# Patient Record
Sex: Female | Born: 1950 | Race: White | Hispanic: No | Marital: Married | Smoking: Never smoker
Health system: Southern US, Community
[De-identification: ages and names within clinical notes are randomized; demographics above are authoritative.]

## PROBLEM LIST (undated history)

## (undated) DIAGNOSIS — E7800 Pure hypercholesterolemia, unspecified: Secondary | ICD-10-CM

## (undated) DIAGNOSIS — Z9889 Other specified postprocedural states: Secondary | ICD-10-CM

## (undated) DIAGNOSIS — F418 Other specified anxiety disorders: Secondary | ICD-10-CM

## (undated) DIAGNOSIS — Z79890 Hormone replacement therapy (postmenopausal): Secondary | ICD-10-CM

## (undated) DIAGNOSIS — M8580 Other specified disorders of bone density and structure, unspecified site: Secondary | ICD-10-CM

## (undated) DIAGNOSIS — J189 Pneumonia, unspecified organism: Secondary | ICD-10-CM

## (undated) DIAGNOSIS — K219 Gastro-esophageal reflux disease without esophagitis: Secondary | ICD-10-CM

## (undated) DIAGNOSIS — E669 Obesity, unspecified: Secondary | ICD-10-CM

## (undated) DIAGNOSIS — R591 Generalized enlarged lymph nodes: Secondary | ICD-10-CM

## (undated) DIAGNOSIS — D869 Sarcoidosis, unspecified: Secondary | ICD-10-CM

## (undated) DIAGNOSIS — R112 Nausea with vomiting, unspecified: Secondary | ICD-10-CM

## (undated) DIAGNOSIS — M1990 Unspecified osteoarthritis, unspecified site: Secondary | ICD-10-CM

## (undated) DIAGNOSIS — F329 Major depressive disorder, single episode, unspecified: Secondary | ICD-10-CM

## (undated) DIAGNOSIS — Z87442 Personal history of urinary calculi: Secondary | ICD-10-CM

## (undated) HISTORY — DX: Unspecified osteoarthritis, unspecified site: M19.90

## (undated) HISTORY — DX: Obesity, unspecified: E66.9

## (undated) HISTORY — DX: Pure hypercholesterolemia, unspecified: E78.00

## (undated) HISTORY — PX: EYE SURGERY: SHX253

## (undated) HISTORY — DX: Major depressive disorder, single episode, unspecified: F32.9

## (undated) HISTORY — PX: BREAST CYST ASPIRATION: SHX578

## (undated) HISTORY — DX: Generalized enlarged lymph nodes: R59.1

## (undated) HISTORY — DX: Hormone replacement therapy: Z79.890

## (undated) HISTORY — PX: CHOLECYSTECTOMY: SHX55

## (undated) HISTORY — DX: Sarcoidosis, unspecified: D86.9

## (undated) HISTORY — PX: GALLBLADDER SURGERY: SHX652

## (undated) HISTORY — PX: LITHOTRIPSY: SUR834

## (undated) HISTORY — DX: Gastro-esophageal reflux disease without esophagitis: K21.9

## (undated) HISTORY — PX: CORRECTION HAMMER TOE: SUR317

## (undated) HISTORY — PX: OTHER SURGICAL HISTORY: SHX169

## (undated) HISTORY — DX: Other specified anxiety disorders: F41.8

## (undated) HISTORY — DX: Other specified disorders of bone density and structure, unspecified site: M85.80

---

## 1999-05-19 ENCOUNTER — Other Ambulatory Visit: Admission: RE | Admit: 1999-05-19 | Discharge: 1999-05-19 | Payer: Self-pay | Admitting: Obstetrics and Gynecology

## 2000-12-06 ENCOUNTER — Other Ambulatory Visit: Admission: RE | Admit: 2000-12-06 | Discharge: 2000-12-06 | Payer: Self-pay | Admitting: Obstetrics and Gynecology

## 2000-12-14 ENCOUNTER — Encounter (HOSPITAL_COMMUNITY): Admission: RE | Admit: 2000-12-14 | Discharge: 2001-01-13 | Payer: Self-pay | Admitting: Rheumatology

## 2001-09-25 ENCOUNTER — Ambulatory Visit (HOSPITAL_COMMUNITY): Admission: RE | Admit: 2001-09-25 | Discharge: 2001-09-25 | Payer: Self-pay | Admitting: Obstetrics and Gynecology

## 2001-09-25 ENCOUNTER — Encounter: Payer: Self-pay | Admitting: Obstetrics and Gynecology

## 2002-10-02 ENCOUNTER — Encounter: Payer: Self-pay | Admitting: Obstetrics & Gynecology

## 2002-10-02 ENCOUNTER — Ambulatory Visit (HOSPITAL_COMMUNITY): Admission: RE | Admit: 2002-10-02 | Discharge: 2002-10-02 | Payer: Self-pay | Admitting: Obstetrics & Gynecology

## 2003-10-07 ENCOUNTER — Ambulatory Visit (HOSPITAL_COMMUNITY): Admission: RE | Admit: 2003-10-07 | Discharge: 2003-10-07 | Payer: Self-pay | Admitting: Obstetrics & Gynecology

## 2004-10-07 ENCOUNTER — Ambulatory Visit (HOSPITAL_COMMUNITY): Admission: RE | Admit: 2004-10-07 | Discharge: 2004-10-07 | Payer: Self-pay | Admitting: Obstetrics & Gynecology

## 2005-09-01 ENCOUNTER — Ambulatory Visit (HOSPITAL_COMMUNITY): Admission: RE | Admit: 2005-09-01 | Discharge: 2005-09-01 | Payer: Self-pay | Admitting: Internal Medicine

## 2005-09-01 ENCOUNTER — Ambulatory Visit: Payer: Self-pay | Admitting: Internal Medicine

## 2005-10-04 ENCOUNTER — Ambulatory Visit (HOSPITAL_COMMUNITY): Admission: RE | Admit: 2005-10-04 | Discharge: 2005-10-04 | Payer: Self-pay | Admitting: Obstetrics & Gynecology

## 2006-09-25 ENCOUNTER — Ambulatory Visit (HOSPITAL_COMMUNITY): Admission: RE | Admit: 2006-09-25 | Discharge: 2006-09-25 | Payer: Self-pay | Admitting: Obstetrics & Gynecology

## 2007-06-18 ENCOUNTER — Ambulatory Visit: Payer: Self-pay | Admitting: Orthopedic Surgery

## 2007-06-18 ENCOUNTER — Ambulatory Visit (HOSPITAL_COMMUNITY): Admission: RE | Admit: 2007-06-18 | Discharge: 2007-06-18 | Payer: Self-pay | Admitting: Pulmonary Disease

## 2007-06-18 DIAGNOSIS — S322XXA Fracture of coccyx, initial encounter for closed fracture: Secondary | ICD-10-CM

## 2007-06-18 DIAGNOSIS — S3210XA Unspecified fracture of sacrum, initial encounter for closed fracture: Secondary | ICD-10-CM | POA: Insufficient documentation

## 2007-06-20 ENCOUNTER — Encounter: Payer: Self-pay | Admitting: Orthopedic Surgery

## 2007-07-05 ENCOUNTER — Encounter (INDEPENDENT_AMBULATORY_CARE_PROVIDER_SITE_OTHER): Payer: Self-pay | Admitting: *Deleted

## 2007-07-18 ENCOUNTER — Emergency Department (HOSPITAL_COMMUNITY): Admission: EM | Admit: 2007-07-18 | Discharge: 2007-07-18 | Payer: Self-pay | Admitting: Emergency Medicine

## 2007-09-07 ENCOUNTER — Other Ambulatory Visit: Admission: RE | Admit: 2007-09-07 | Discharge: 2007-09-07 | Payer: Self-pay | Admitting: Obstetrics and Gynecology

## 2007-09-25 ENCOUNTER — Ambulatory Visit (HOSPITAL_COMMUNITY): Admission: RE | Admit: 2007-09-25 | Discharge: 2007-09-25 | Payer: Self-pay | Admitting: Obstetrics & Gynecology

## 2007-09-26 ENCOUNTER — Ambulatory Visit (HOSPITAL_COMMUNITY): Admission: RE | Admit: 2007-09-26 | Discharge: 2007-09-26 | Payer: Self-pay | Admitting: Obstetrics & Gynecology

## 2008-06-12 ENCOUNTER — Ambulatory Visit: Payer: Self-pay | Admitting: Orthopedic Surgery

## 2008-06-12 DIAGNOSIS — L609 Nail disorder, unspecified: Secondary | ICD-10-CM | POA: Insufficient documentation

## 2008-06-13 ENCOUNTER — Encounter: Payer: Self-pay | Admitting: Orthopedic Surgery

## 2008-07-14 ENCOUNTER — Ambulatory Visit (HOSPITAL_COMMUNITY): Admission: RE | Admit: 2008-07-14 | Discharge: 2008-07-14 | Payer: Self-pay | Admitting: Pulmonary Disease

## 2008-10-10 ENCOUNTER — Other Ambulatory Visit: Admission: RE | Admit: 2008-10-10 | Discharge: 2008-10-10 | Payer: Self-pay | Admitting: Obstetrics and Gynecology

## 2008-10-10 ENCOUNTER — Ambulatory Visit (HOSPITAL_COMMUNITY): Admission: RE | Admit: 2008-10-10 | Discharge: 2008-10-10 | Payer: Self-pay | Admitting: Obstetrics & Gynecology

## 2009-09-29 ENCOUNTER — Ambulatory Visit (HOSPITAL_COMMUNITY): Admission: RE | Admit: 2009-09-29 | Discharge: 2009-09-29 | Payer: Self-pay | Admitting: Obstetrics & Gynecology

## 2009-10-29 ENCOUNTER — Other Ambulatory Visit: Admission: RE | Admit: 2009-10-29 | Discharge: 2009-10-29 | Payer: Self-pay | Admitting: Obstetrics and Gynecology

## 2010-06-12 ENCOUNTER — Emergency Department (HOSPITAL_COMMUNITY): Admission: EM | Admit: 2010-06-12 | Discharge: 2010-06-12 | Payer: Self-pay | Admitting: Emergency Medicine

## 2010-06-14 ENCOUNTER — Ambulatory Visit (HOSPITAL_COMMUNITY)
Admission: RE | Admit: 2010-06-14 | Discharge: 2010-06-14 | Payer: Self-pay | Source: Home / Self Care | Admitting: Pulmonary Disease

## 2010-09-10 ENCOUNTER — Other Ambulatory Visit: Payer: Self-pay | Admitting: Obstetrics & Gynecology

## 2010-09-10 DIAGNOSIS — Z1239 Encounter for other screening for malignant neoplasm of breast: Secondary | ICD-10-CM

## 2010-09-11 ENCOUNTER — Encounter: Payer: Self-pay | Admitting: Obstetrics & Gynecology

## 2010-09-12 ENCOUNTER — Encounter: Payer: Self-pay | Admitting: Obstetrics & Gynecology

## 2010-09-28 ENCOUNTER — Ambulatory Visit (HOSPITAL_COMMUNITY): Admission: RE | Admit: 2010-09-28 | Payer: Self-pay | Source: Home / Self Care | Admitting: Obstetrics & Gynecology

## 2010-09-28 ENCOUNTER — Ambulatory Visit (HOSPITAL_COMMUNITY)
Admission: RE | Admit: 2010-09-28 | Discharge: 2010-09-28 | Disposition: A | Discharge status: Home / Self Care | Payer: 59 | Source: Ambulatory Visit | Attending: Obstetrics & Gynecology | Admitting: Obstetrics & Gynecology

## 2010-09-28 DIAGNOSIS — Z1239 Encounter for other screening for malignant neoplasm of breast: Secondary | ICD-10-CM

## 2010-09-28 DIAGNOSIS — Z1231 Encounter for screening mammogram for malignant neoplasm of breast: Secondary | ICD-10-CM | POA: Insufficient documentation

## 2011-01-07 NOTE — Op Note (Signed)
NAME:  Courtney Bradley, Courtney Bradley            ACCOUNT NO.:  [id number]   MEDICAL RECORD NO.:  [id number]          PATIENT TYPE:  AMB   LOCATION:  DAY                           FACILITY:  [facility]   PHYSICIAN:  Lionel December, M.D.    DATE OF BIRTH:  Oct 02, 1950   DATE OF PROCEDURE:  09/01/2005  DATE OF DISCHARGE:                                 OPERATIVE REPORT   PROCEDURE:  Colonoscopy.   INDICATION:  Courtney Bradley is a 60 year old Caucasian female who is here for  screening colonoscopy. Procedure and risks were reviewed with the patient,  and informed consent was obtained. Family history is negative for colorectal  carcinoma.   FINDINGS:  Procedure performed in endoscopy suite. The patient's vital signs  and O2 saturation were monitored during the procedure and remained stable.  The patient was placed in left lateral position and rectal examination  performed. No abnormality noted on external or digital exam. Olympus  videoscope was placed in rectum and advanced under vision into sigmoid colon  and beyond. Preparation was satisfactory. She had some liquid stool which  was suctioned out. There were a few tiny diverticula at sigmoid colon. The  patient was turned on her right to pass the scope across the angle of  transverse colon. Further intubation to cecum was easy. Cecum was identified  by ileocecal valve and appendiceal orifice. Pictures taken for the record.  As the scope was withdrawn, colonic mucosa was carefully examined, and there  were no polyps and/or other mucosal abnormalities. Rectal mucosa similarly  was normal. Scope was retroflexed to examine anorectal junction, and small  hemorrhoids were noted below the dentate line. Endoscope was straightened  and withdrawn. The patient tolerated the procedure well.   FINAL DIAGNOSIS:  Few tiny diverticula at sigmoid colon and small external  hemorrhoids, otherwise normal colonoscopy.   RECOMMENDATIONS:  1.  High-fiber diet.  2.  Yearly  Hemoccults.  3.  She may consider next screening exam in 10 years from now.   INDICATION:  Courtney Bradley is a 60 year old Caucasian female who is undergoing  screening colonoscopy. Family history is negative for colorectal carcinoma.  Procedure and risks were reviewed with the patient, and informed consent was  obtained.   MEDICINE FOR CONSCIOUS SEDATION:  Demerol 50 mg IV, Versed 5 mg IV.   FINDINGS:  Procedure performed in endoscopy suite. The patient's vital signs  and O2 saturation were monitored during the procedure and remained stable.  The patient was placed in left lateral position. Rectal examination  performed. No abnormality noted on external or digital exam. Olympus  videoscope was placed in rectum and advanced under vision into sigmoid colon  and beyond. Preparation was satisfactory. Scope was advanced into cecum  which was identified by appendiceal orifice and ileocecal valve. Pictures  taken for the record. As the scope was withdrawn, colonic mucosa was  carefully examined. There no polyps and/or tumor masses. Few small  diverticula were noted sigmoid colon. Rectal mucosa was normal. Scope was  retroflexed to examine anorectal junction ,and small hemorrhoids were noted  below the dentate line. Endoscope was straightened and withdrawn. The  patient tolerated the procedure well.   FINAL DIAGNOSIS:  Few tiny diverticula at sigmoid colon, external  hemorrhoids, otherwise normal examination.   RECOMMENDATIONS:  1.  High-fiber diet.  2.  Yearly Hemoccults.  3.  She may consider next screening exam in 10 years from now.      Lionel December, M.D.  Electronically Signed     NR/MEDQ  D:  09/01/2005  T:  09/01/2005  Job:  [id number]   cc:   Lazaro Arms, M.D.  Fax: [phone]   Oneal Deputy. Juanetta Gosling, M.D.  Fax: [phone]

## 2011-01-20 ENCOUNTER — Other Ambulatory Visit (HOSPITAL_COMMUNITY): Payer: Self-pay | Admitting: Pulmonary Disease

## 2011-01-20 DIAGNOSIS — M545 Low back pain: Secondary | ICD-10-CM

## 2011-01-24 ENCOUNTER — Other Ambulatory Visit (HOSPITAL_COMMUNITY): Payer: Self-pay | Admitting: Pulmonary Disease

## 2011-01-24 ENCOUNTER — Ambulatory Visit (HOSPITAL_COMMUNITY)
Admission: RE | Admit: 2011-01-24 | Discharge: 2011-01-24 | Disposition: A | Discharge status: Home / Self Care | Payer: 59 | Source: Ambulatory Visit | Attending: Pulmonary Disease | Admitting: Pulmonary Disease

## 2011-01-24 DIAGNOSIS — M545 Low back pain, unspecified: Secondary | ICD-10-CM | POA: Insufficient documentation

## 2011-01-24 DIAGNOSIS — M25559 Pain in unspecified hip: Secondary | ICD-10-CM | POA: Insufficient documentation

## 2011-01-24 DIAGNOSIS — S3210XA Unspecified fracture of sacrum, initial encounter for closed fracture: Secondary | ICD-10-CM | POA: Insufficient documentation

## 2011-01-24 DIAGNOSIS — R9389 Abnormal findings on diagnostic imaging of other specified body structures: Secondary | ICD-10-CM

## 2011-01-24 DIAGNOSIS — M5126 Other intervertebral disc displacement, lumbar region: Secondary | ICD-10-CM | POA: Insufficient documentation

## 2011-01-24 DIAGNOSIS — M79609 Pain in unspecified limb: Secondary | ICD-10-CM | POA: Insufficient documentation

## 2011-01-24 DIAGNOSIS — X58XXXA Exposure to other specified factors, initial encounter: Secondary | ICD-10-CM | POA: Insufficient documentation

## 2011-01-24 DIAGNOSIS — S32509A Unspecified fracture of unspecified pubis, initial encounter for closed fracture: Secondary | ICD-10-CM | POA: Insufficient documentation

## 2011-01-25 ENCOUNTER — Telehealth: Payer: Self-pay | Admitting: Orthopedic Surgery

## 2011-01-25 ENCOUNTER — Other Ambulatory Visit (HOSPITAL_COMMUNITY): Payer: Self-pay | Admitting: Pulmonary Disease

## 2011-01-25 DIAGNOSIS — S3210XA Unspecified fracture of sacrum, initial encounter for closed fracture: Secondary | ICD-10-CM

## 2011-01-25 NOTE — Telephone Encounter (Signed)
Courtney Bradley says that Dr. Juanetta Gosling has ordered an MRI for her and it showed a fractured sacrum.  Dr. Juanetta Gosling told her to go to bed until He could refer to you.  We have not received anything from Dr. Juanetta Gosling yet.  She asked if you will look at the MRI on the computer and let her know  What is the next step.  Said she needs some directions as to what the treatment is and if you need to see her.  Is already on FMLA  But  Asked if you could give her some idea of how long she will be out of work.

## 2011-01-25 NOTE — Telephone Encounter (Signed)
Courtney Bradley a note to schedule patient for tomorrow

## 2011-01-26 ENCOUNTER — Telehealth: Payer: Self-pay | Admitting: Orthopedic Surgery

## 2011-01-26 ENCOUNTER — Ambulatory Visit (INDEPENDENT_AMBULATORY_CARE_PROVIDER_SITE_OTHER): Payer: 59 | Admitting: Orthopedic Surgery

## 2011-01-26 ENCOUNTER — Encounter: Payer: Self-pay | Admitting: Orthopedic Surgery

## 2011-01-26 ENCOUNTER — Ambulatory Visit (HOSPITAL_COMMUNITY)
Admission: RE | Admit: 2011-01-26 | Discharge: 2011-01-26 | Disposition: A | Discharge status: Home / Self Care | Payer: 59 | Source: Ambulatory Visit | Attending: Pulmonary Disease | Admitting: Pulmonary Disease

## 2011-01-26 VITALS — HR 80 | Resp 16 | Ht 67.0 in | Wt 194.0 lb

## 2011-01-26 DIAGNOSIS — S3210XA Unspecified fracture of sacrum, initial encounter for closed fracture: Secondary | ICD-10-CM

## 2011-01-26 DIAGNOSIS — M899 Disorder of bone, unspecified: Secondary | ICD-10-CM | POA: Insufficient documentation

## 2011-01-26 DIAGNOSIS — M4848XA Fatigue fracture of vertebra, sacral and sacrococcygeal region, initial encounter for fracture: Secondary | ICD-10-CM

## 2011-01-26 DIAGNOSIS — M8430XA Stress fracture, unspecified site, initial encounter for fracture: Secondary | ICD-10-CM

## 2011-01-26 DIAGNOSIS — Z780 Asymptomatic menopausal state: Secondary | ICD-10-CM | POA: Insufficient documentation

## 2011-01-26 NOTE — Telephone Encounter (Signed)
In 2 weeks.

## 2011-01-26 NOTE — Progress Notes (Signed)
Neutral  Back pain Started May 18 Sudden onset Symptoms started while patient was running, patient does a lot of running and walking for exercise Symptoms sharp pain Intensity 5 Occurs only when walking Somewhat improved with ibuprofen Denies numbness or tingling or radicular symptoms This is a 60 year old female who is on estrogen and recently had a bone density test showing osteopenia who presents with pain in her LEFT sacral and lumbar area after running.  MRI and plain films confirm she has a sacral fracture most likely stress-related and also has a pubic fracture on the LEFT side although that is asymptomatic  Review of systems: All systems reviewed all systems negative except for the musculoskeletal as stated  MRI lumbar spine shows a as LEFT sacral alar insufficiency fracture mild disc bulging L3-L4, L4-L5, L5-S1.  Recent DEXA scan shows osteopenia  Exam normal development coming in hygiene.  Oriented x3.  Mood and affect normal.  Ambulation abnormal.  Abnormal gait on the LEFT.  Mild tenderness lumbar spine.  Negative straight leg raises.  Knees and hips are stable with normal strength and muscle tone and no skin rash or lesion.  Pulse and temperature normal.  Reflexes normal.  No lymphadenopathy.  Balance coordination normal.  Imaging studies as stated  Impression LEFT sacrococcygeal stress fracture.  Recommend protected weightbearing with a walker Recommend calcium with vitamin D Caltrate 600 twice a day  Recommend continue estrogen Out of work 4 weeks and then reassess

## 2011-01-26 NOTE — Patient Instructions (Signed)
Stop Ibuprofen  Take pain medication that you have called in  Use walker  Take Caltrate plus D 1200 mg (2 tablets)  Come back in 3 weeks for a recheck  Needs work note, start OOW June 4th for 4 weeks

## 2011-01-26 NOTE — Telephone Encounter (Signed)
Courtney Bradley forgot to ask you today if she can swim in the swimming pool,

## 2011-01-26 NOTE — Telephone Encounter (Signed)
This patient was seen today, 01/26/11

## 2011-01-26 NOTE — Telephone Encounter (Signed)
Starlyn Skeans of doctor's reply

## 2011-02-16 ENCOUNTER — Ambulatory Visit (INDEPENDENT_AMBULATORY_CARE_PROVIDER_SITE_OTHER): Payer: 59 | Admitting: Orthopedic Surgery

## 2011-02-16 ENCOUNTER — Encounter: Payer: Self-pay | Admitting: Orthopedic Surgery

## 2011-02-16 DIAGNOSIS — S322XXA Fracture of coccyx, initial encounter for closed fracture: Secondary | ICD-10-CM

## 2011-02-16 DIAGNOSIS — S3210XA Unspecified fracture of sacrum, initial encounter for closed fracture: Secondary | ICD-10-CM

## 2011-02-16 NOTE — Patient Instructions (Addendum)
Transition to cane   Increase activity slowly   Continue OOW thru July 16

## 2011-02-17 NOTE — Progress Notes (Signed)
   Follow up Visit stress fracture pelvis  Patient improving but still having difficulty ambulating with pain and fatigue  Tenderness in the sacrum.  No neurologic deficits.  Recommend continued conservative care progress to cane follow up in 3 weeks consider going back to work at that time

## 2011-02-22 ENCOUNTER — Ambulatory Visit: Payer: 59 | Admitting: Orthopedic Surgery

## 2011-03-10 ENCOUNTER — Encounter: Payer: Self-pay | Admitting: Orthopedic Surgery

## 2011-03-10 ENCOUNTER — Ambulatory Visit (INDEPENDENT_AMBULATORY_CARE_PROVIDER_SITE_OTHER): Payer: 59 | Admitting: Orthopedic Surgery

## 2011-03-10 DIAGNOSIS — S322XXA Fracture of coccyx, initial encounter for closed fracture: Secondary | ICD-10-CM

## 2011-03-10 DIAGNOSIS — M8430XA Stress fracture, unspecified site, initial encounter for fracture: Secondary | ICD-10-CM

## 2011-03-10 DIAGNOSIS — M4848XA Fatigue fracture of vertebra, sacral and sacrococcygeal region, initial encounter for fracture: Secondary | ICD-10-CM

## 2011-03-10 NOTE — Patient Instructions (Signed)
Return in 2 weeks  OOW until she comes back to see [place]

## 2011-03-10 NOTE — Progress Notes (Signed)
Follow up Visit stress fracture pelvis: Started May 18  Sudden onset  The patient is doing well progressing towards normal when she tried to go out and walk. She walked approximately 1/2 miles and had severe pain again.  She denies any numbness or tingling in her legs. Denies any groin pain, just, lumbar spine pain in the lower back and pain in the LEFT gluteal area.  She is ambulating much better continues to improve,  She'll be out of work another 2 weeks and I will see her an attempt to get her back to her normal activities

## 2011-03-24 ENCOUNTER — Encounter: Payer: Self-pay | Admitting: Orthopedic Surgery

## 2011-03-24 ENCOUNTER — Ambulatory Visit (INDEPENDENT_AMBULATORY_CARE_PROVIDER_SITE_OTHER): Payer: 59 | Admitting: Orthopedic Surgery

## 2011-03-24 DIAGNOSIS — M8430XA Stress fracture, unspecified site, initial encounter for fracture: Secondary | ICD-10-CM

## 2011-03-24 DIAGNOSIS — M4848XA Fatigue fracture of vertebra, sacral and sacrococcygeal region, initial encounter for fracture: Secondary | ICD-10-CM

## 2011-03-24 DIAGNOSIS — S322XXA Fracture of coccyx, initial encounter for closed fracture: Secondary | ICD-10-CM

## 2011-03-24 DIAGNOSIS — S3210XA Unspecified fracture of sacrum, initial encounter for closed fracture: Secondary | ICD-10-CM

## 2011-03-24 NOTE — Patient Instructions (Signed)
Return to work on [employer]

## 2011-03-24 NOTE — Progress Notes (Signed)
Follow up Visit stress fracture pelvis: Started May 18    Continues to improve, no pain; non tender L spine

## 2011-04-19 ENCOUNTER — Encounter: Payer: Self-pay | Admitting: Orthopedic Surgery

## 2011-04-19 ENCOUNTER — Ambulatory Visit (INDEPENDENT_AMBULATORY_CARE_PROVIDER_SITE_OTHER): Payer: 59 | Admitting: Orthopedic Surgery

## 2011-04-19 ENCOUNTER — Telehealth: Payer: Self-pay | Admitting: Orthopedic Surgery

## 2011-04-19 DIAGNOSIS — M549 Dorsalgia, unspecified: Secondary | ICD-10-CM

## 2011-04-19 MED ORDER — PREDNISONE 10 MG PO KIT: 10.0000 mg | PACK | ORAL | Status: DC

## 2011-04-19 MED ORDER — METHYLPREDNISOLONE ACETATE 40 MG/ML IJ SUSP: 40.0000 mg | Freq: Once | INTRAMUSCULAR | Status: DC

## 2011-04-19 NOTE — Telephone Encounter (Signed)
Someone from [facility] left a message to call [phone] about a prescription for [employer]

## 2011-04-19 NOTE — Progress Notes (Signed)
History a stress fracture. Recently treated return back to work started having some groin pain, which resolved and now presents with back pain radiating to both legs lateral thigh area associated with a limp.  Review of systems had a ultrasound of her pelvis, which showed no evidence of ovarian cyst. No catching or locking. Seems to only have pain, when she's walking  Exam shows a normal standing posture with hip height difference. Flexes normally. Pain with extension. No pain with rotation or lateral flexion. The toe status was normal. Neurovascular exam intact. Both lower extremities.  We gave her an IM shot of Depo-Medrol and start her on a prednisone Dosepak.  Presumptive diagnosis back pain.  Plan take Dosepak for 2 weeks if no improvement. Recheck

## 2011-04-19 NOTE — Patient Instructions (Signed)
Ice or heat as needed   Stop ibuprofen until dose pack completed

## 2011-04-21 ENCOUNTER — Encounter: Payer: Self-pay | Admitting: Orthopedic Surgery

## 2011-04-21 ENCOUNTER — Telehealth: Payer: Self-pay | Admitting: Orthopedic Surgery

## 2011-04-21 NOTE — Telephone Encounter (Signed)
Called the patient advised of doctor's reply, she will come by to pick up her work note today

## 2011-04-21 NOTE — Telephone Encounter (Signed)
Kiyra called this morning, she is trying to work but her pain is very bad, can hardly walk.  She is taking the Predisone and Hydrocodone 5/500. Only taking 1 pain pill every 4 hours because she is afraid to take the 2 and try to work.  Asked what is the next step, the pain is bad. At work till noon  [phone]  After that cell# [phone]

## 2011-04-21 NOTE — Telephone Encounter (Signed)
OOW 5 days

## 2011-05-16 ENCOUNTER — Ambulatory Visit: Payer: 59 | Admitting: Sports Medicine

## 2011-06-22 ENCOUNTER — Other Ambulatory Visit (HOSPITAL_COMMUNITY): Payer: Self-pay | Admitting: Rheumatology

## 2011-06-24 ENCOUNTER — Encounter (HOSPITAL_COMMUNITY): Payer: Self-pay

## 2011-06-24 ENCOUNTER — Ambulatory Visit (HOSPITAL_COMMUNITY)
Admission: RE | Admit: 2011-06-24 | Discharge: 2011-06-24 | Disposition: A | Discharge status: Home / Self Care | Payer: 59 | Source: Ambulatory Visit | Attending: Rheumatology | Admitting: Rheumatology

## 2011-06-24 DIAGNOSIS — M25559 Pain in unspecified hip: Secondary | ICD-10-CM | POA: Insufficient documentation

## 2011-06-24 DIAGNOSIS — R748 Abnormal levels of other serum enzymes: Secondary | ICD-10-CM | POA: Insufficient documentation

## 2011-06-24 DIAGNOSIS — R937 Abnormal findings on diagnostic imaging of other parts of musculoskeletal system: Secondary | ICD-10-CM | POA: Insufficient documentation

## 2011-06-24 MED ORDER — TECHNETIUM TC 99M MEDRONATE IV KIT
25.0000 | PACK | Freq: Once | INTRAVENOUS | Status: AC | PRN
  Administered 2011-06-24: 25.3 via INTRAVENOUS

## 2011-06-27 ENCOUNTER — Encounter (HOSPITAL_COMMUNITY): Payer: 59

## 2011-06-27 ENCOUNTER — Ambulatory Visit (HOSPITAL_COMMUNITY): Payer: 59

## 2011-09-21 ENCOUNTER — Telehealth: Payer: Self-pay | Admitting: Orthopedic Surgery

## 2011-09-21 NOTE — Telephone Encounter (Signed)
Courtney Bradley asked if you will call in a prescription for Naproxen for her to [facility], to be delivered to [facility].  Says she will come in if you need to check her again Can reach her at work # [phone] then home # [phone]

## 2011-09-22 NOTE — Telephone Encounter (Signed)
Approved but need more information dose and frequency

## 2011-09-22 NOTE — Telephone Encounter (Signed)
[facility] , she was not at home, tried her cell, no answer

## 2011-09-23 MED ORDER — NAPROXEN 500 MG PO TABS: 500.0000 mg | ORAL_TABLET | Freq: Two times a day (BID) | ORAL | Status: DC

## 2011-09-23 NOTE — Telephone Encounter (Signed)
Patient returned call with the requested information:   Naproxen 500 mg's 2X daily, although she states she takes it 1X daily and that this has really helped her.  Her pharmacy as noted is [facility].  She has her medications delivered to [facility]

## 2011-09-23 NOTE — Telephone Encounter (Signed)
MED SENT AS REQUESTED  

## 2011-09-27 ENCOUNTER — Other Ambulatory Visit: Payer: Self-pay | Admitting: Obstetrics & Gynecology

## 2011-09-27 ENCOUNTER — Ambulatory Visit (HOSPITAL_COMMUNITY)
Admission: RE | Admit: 2011-09-27 | Discharge: 2011-09-27 | Disposition: A | Discharge status: Home / Self Care | Payer: 59 | Source: Ambulatory Visit | Attending: Obstetrics & Gynecology | Admitting: Obstetrics & Gynecology

## 2011-09-27 DIAGNOSIS — Z1231 Encounter for screening mammogram for malignant neoplasm of breast: Secondary | ICD-10-CM | POA: Insufficient documentation

## 2011-09-27 DIAGNOSIS — Z139 Encounter for screening, unspecified: Secondary | ICD-10-CM

## 2011-12-30 ENCOUNTER — Other Ambulatory Visit (HOSPITAL_COMMUNITY)
Admission: RE | Admit: 2011-12-30 | Discharge: 2011-12-30 | Disposition: A | Discharge status: Home / Self Care | Payer: 59 | Source: Ambulatory Visit | Attending: Obstetrics and Gynecology | Admitting: Obstetrics and Gynecology

## 2011-12-30 ENCOUNTER — Other Ambulatory Visit: Payer: Self-pay | Admitting: Adult Health

## 2011-12-30 DIAGNOSIS — Z01419 Encounter for gynecological examination (general) (routine) without abnormal findings: Secondary | ICD-10-CM | POA: Insufficient documentation

## 2012-01-01 ENCOUNTER — Other Ambulatory Visit: Payer: Self-pay | Admitting: Obstetrics and Gynecology

## 2012-01-01 ENCOUNTER — Emergency Department (HOSPITAL_COMMUNITY)
Admission: EM | Admit: 2012-01-01 | Discharge: 2012-01-01 | Disposition: A | Discharge status: Home / Self Care | Payer: 59 | Attending: Emergency Medicine | Admitting: Emergency Medicine

## 2012-01-01 ENCOUNTER — Ambulatory Visit (HOSPITAL_COMMUNITY)
Admission: RE | Admit: 2012-01-01 | Discharge: 2012-01-01 | Disposition: A | Discharge status: Home / Self Care | Payer: 59 | Source: Ambulatory Visit | Attending: Obstetrics and Gynecology | Admitting: Obstetrics and Gynecology

## 2012-01-01 ENCOUNTER — Encounter (HOSPITAL_COMMUNITY): Payer: Self-pay | Admitting: *Deleted

## 2012-01-01 DIAGNOSIS — S2239XA Fracture of one rib, unspecified side, initial encounter for closed fracture: Secondary | ICD-10-CM | POA: Insufficient documentation

## 2012-01-01 DIAGNOSIS — W19XXXA Unspecified fall, initial encounter: Secondary | ICD-10-CM | POA: Insufficient documentation

## 2012-01-01 DIAGNOSIS — F341 Dysthymic disorder: Secondary | ICD-10-CM | POA: Insufficient documentation

## 2012-01-01 DIAGNOSIS — Y9301 Activity, walking, marching and hiking: Secondary | ICD-10-CM | POA: Insufficient documentation

## 2012-01-01 MED ORDER — OXYCODONE-ACETAMINOPHEN 5-325 MG PO TABS
1.0000 | ORAL_TABLET | Freq: Once | ORAL | Status: AC
  Administered 2012-01-01: 1 via ORAL
  Filled 2012-01-01: qty 1

## 2012-01-01 MED ORDER — OXYCODONE-ACETAMINOPHEN 5-325 MG PO TABS: 1.0000 | ORAL_TABLET | Freq: Four times a day (QID) | ORAL | Status: AC | PRN

## 2012-01-01 NOTE — ED Provider Notes (Signed)
This chart was scribed for Benny Lennert, MD by Wallis Mart. The patient was seen in room APA03/[facility]03 and the patient's care was started at 8:32AM.   CSN: [id number]  Arrival date & time 01/01/12  4098   First MD Initiated Contact with Patient 01/01/12 [phone]      Chief Complaint  Patient presents with  . Rib Fracture    (Consider location/radiation/quality/duration/timing/severity/associated sxs/prior treatment) HPI   Courtney Bradley is a 61 y.o. female who presents to the Emergency Department complaining of sudden onset, persistence of constant, moderate to severe rib pain resulting from a fall that occurred 2 days ago.  The pain radiates nowhere.  Pt states that she was walking Friday morning and fell forwards, hurting her ribs.  Pt had an xray this morning that showed "nondisplaced fracture of the left lateral 4th and 4th ribs."  There are no other associated symptoms and no other alleviating or aggravating factors.  Past Medical History  Diagnosis Date  . Depression with anxiety     Past Surgical History  Procedure Date  . Gallbladder surgery   . Neck lift   . Cholecystectomy     Family History  Problem Relation Age of Onset  . Heart disease    . Cancer    . Diabetes    . Coronary artery disease    . Arthritis      History  Substance Use Topics  . Smoking status: Never Smoker   . Smokeless tobacco: Not on file  . Alcohol Use: No    OB History    Grav Para Term Preterm Abortions TAB SAB Ect Mult Living                  Review of Systems  10 Systems reviewed and all are negative for acute change except as noted in the HPI.    Allergies  Review of patient's allergies indicates no known allergies.  Home Medications   Current Outpatient Rx  Name Route Sig Dispense Refill  . GNP CALCIUM PLUS 600 +D PO Oral Take by mouth.      . ESTRADIOL 0.0375 MG/24HR TD PTWK Transdermal Place 1 patch onto the skin once a week.      . OMEGA-3 FATTY ACIDS 1000  MG PO CAPS Oral Take 2 g by mouth daily.      . IBUPROFEN 400 MG PO TABS Oral Take 400 mg by mouth every 6 (six) hours as needed.      . MULTIVITAMIN PO Oral Take by mouth.      Marland Kitchen NAPROXEN 500 MG PO TABS Oral Take 1 tablet (500 mg total) by mouth 2 (two) times daily with a meal. 180 tablet 0    DELIVER TO [facility]  . PREDNISONE 10 MG PO KIT Oral Take 1 kit (10 mg total) by mouth as directed. 1 kit 0    12 days  . ZOLOFT PO Oral Take by mouth.        BP 137/91  Pulse 75  Temp(Src) 98.1 F (36.7 C) (Oral)  Resp 16  Ht 5\' 7"  (1.702 m)  Wt 219 lb (99.338 kg)  BMI 34.30 kg/m2  SpO2 99%  Physical Exam  Constitutional: She is oriented to person, place, and time. She appears well-developed.  HENT:  Head: Normocephalic and atraumatic.  Eyes: Conjunctivae and EOM are normal. No scleral icterus.  Neck: Neck supple. No thyromegaly present.  Cardiovascular: Normal rate and regular rhythm.  Exam reveals no gallop and  no friction rub.   No murmur heard. Pulmonary/Chest: No stridor. She has no wheezes. She has no rales. She exhibits no tenderness.  Abdominal: She exhibits no distension. There is no tenderness. There is no rebound.       Tenderness to left lateral ribcage  Musculoskeletal: Normal range of motion. She exhibits no edema.  Lymphadenopathy:    She has no cervical adenopathy.  Neurological: She is oriented to person, place, and time. Coordination normal.  Skin: No rash noted. No erythema.  Psychiatric: She has a normal mood and affect. Her behavior is normal.    ED Course  Procedures (including critical care time)  DIAGNOSTIC STUDIES: Oxygen Saturation is 99% on room air, normal by my interpretation.    COORDINATION OF CARE:  8:39 AM: EDP at pts bedside. All results reviewed and discussed with pt, questions answered, pt agreeable with plan.    Labs Reviewed - No data to display Dg Ribs Unilateral W/chest Left  01/01/2012  *RADIOLOGY REPORT*  Clinical Data: Rib pain,  fall  LEFT RIBS AND CHEST - 3+ VIEW  Comparison: All bone scan 06/24/2011  Findings: Minimally-displaced fractures of the left lateral fourth and fifth ribs.  There is mild atelectasis and small effusion at the left lung base.  There is no discrete pneumothorax.  Normal cardiac silhouette.  Lungs are clear otherwise.  IMPRESSION:  1.  Nondisplaced fracture of the left lateral fourth and fifth ribs. 2.  Small effusion and atelectasis at the left lung base.  Findings discussed with the patient personally.  Recommend patient to go to the emergency department if the patient develops short of breath or worsening chest pain.  Original Report Authenticated By: Genevive Bi, M.D.     No diagnosis found.    MDM   The chart was scribed for me under my direct supervision.  I personally performed the history, physical, and medical decision making and all procedures in the evaluation of this patient.Benny Lennert, MD 01/01/12 [phone]

## 2012-01-01 NOTE — Discharge Instructions (Signed)
Follow up with your md in one week for recheck °

## 2012-01-01 NOTE — ED Notes (Signed)
Pt seen and evaluated by MD prior to my assessment.  

## 2012-01-01 NOTE — ED Notes (Signed)
Pt fell Friday. Rib pain, hurts to take a deep breath. Had xray this morning showing "Nondisplaced fracture of the left lateral 4th and 5th ribs. Small effusion and atelectasis at the left lung base. "

## 2012-04-03 ENCOUNTER — Other Ambulatory Visit: Payer: Self-pay | Admitting: Orthopedic Surgery

## 2012-08-29 ENCOUNTER — Other Ambulatory Visit: Payer: Self-pay | Admitting: *Deleted

## 2012-08-29 DIAGNOSIS — M8430XA Stress fracture, unspecified site, initial encounter for fracture: Secondary | ICD-10-CM

## 2012-08-29 MED ORDER — NAPROXEN 500 MG PO TABS: 500.0000 mg | ORAL_TABLET | Freq: Two times a day (BID) | ORAL | Status: DC

## 2012-09-14 ENCOUNTER — Other Ambulatory Visit (HOSPITAL_COMMUNITY): Payer: Self-pay | Admitting: Pulmonary Disease

## 2012-09-14 ENCOUNTER — Ambulatory Visit (HOSPITAL_COMMUNITY)
Admission: RE | Admit: 2012-09-14 | Discharge: 2012-09-14 | Disposition: A | Discharge status: Home / Self Care | Payer: 59 | Source: Ambulatory Visit | Attending: Pulmonary Disease | Admitting: Pulmonary Disease

## 2012-09-14 DIAGNOSIS — R0781 Pleurodynia: Secondary | ICD-10-CM

## 2012-09-14 DIAGNOSIS — R05 Cough: Secondary | ICD-10-CM | POA: Insufficient documentation

## 2012-09-14 DIAGNOSIS — R059 Cough, unspecified: Secondary | ICD-10-CM | POA: Insufficient documentation

## 2012-09-14 DIAGNOSIS — R079 Chest pain, unspecified: Secondary | ICD-10-CM | POA: Insufficient documentation

## 2012-09-25 ENCOUNTER — Other Ambulatory Visit: Payer: Self-pay | Admitting: Adult Health

## 2012-09-25 DIAGNOSIS — Z139 Encounter for screening, unspecified: Secondary | ICD-10-CM

## 2012-09-27 ENCOUNTER — Ambulatory Visit (HOSPITAL_COMMUNITY)
Admission: RE | Admit: 2012-09-27 | Discharge: 2012-09-27 | Disposition: A | Discharge status: Home / Self Care | Payer: 59 | Source: Ambulatory Visit | Attending: Adult Health | Admitting: Adult Health

## 2012-09-27 DIAGNOSIS — Z139 Encounter for screening, unspecified: Secondary | ICD-10-CM

## 2012-09-27 DIAGNOSIS — Z1231 Encounter for screening mammogram for malignant neoplasm of breast: Secondary | ICD-10-CM | POA: Insufficient documentation

## 2013-06-18 ENCOUNTER — Institutional Professional Consult (permissible substitution) (INDEPENDENT_AMBULATORY_CARE_PROVIDER_SITE_OTHER): Payer: 59

## 2013-06-18 ENCOUNTER — Ambulatory Visit (INDEPENDENT_AMBULATORY_CARE_PROVIDER_SITE_OTHER): Payer: 59

## 2013-06-18 VITALS — BP 146/92 | HR 77 | Resp 16 | Ht 67.0 in | Wt 210.0 lb

## 2013-06-18 DIAGNOSIS — M2040 Other hammer toe(s) (acquired), unspecified foot: Secondary | ICD-10-CM

## 2013-06-18 DIAGNOSIS — M79609 Pain in unspecified limb: Secondary | ICD-10-CM

## 2013-06-18 NOTE — Progress Notes (Signed)
  Subjective:    Patient ID: Courtney Bradley, female    DOB: [birth year], 62 y.o.   MRN: [id number] "I have Hammer Toes that are bothering me." Patient is a history of several years of increasing hammertoe deformity right more so than left, second toes bilateral. 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 Although left is now developing similar deformities and has not had injections in the past  Toe Pain  The incident occurred more than 1 week ago. There was no injury mechanism. The pain is present in the left toes and right toes. The pain is mild.      Review of Systems  Constitutional: Negative.   HENT: Positive for dental problem.   Eyes: Negative.   Respiratory: Negative.   Cardiovascular: Negative.   Gastrointestinal: Negative.   Endocrine: Positive for heat intolerance.  Genitourinary: Negative.   Musculoskeletal: Positive for arthralgias.  Skin: Negative.   Allergic/Immunologic: Negative.   Neurological: Negative.   Hematological: Bruises/bleeds easily.  Psychiatric/Behavioral: Negative.        Objective:   Physical Exam  Vitals reviewed. Constitutional: She is oriented to person, place, and time. She appears well-developed and well-nourished.  Cardiovascular:  Pulses:      Dorsalis pedis pulses are 2+ on the right side, and 2+ on the left side.       Posterior tibial pulses are 2+ on the right side, and 2+ on the left side.  Capillary refill time 3 seconds all digits bilateral. Skin temperature warm. Turgor normal no edema rubor pallor or varicosities noted bilateral  Musculoskeletal:  Orthopedic biomechanical exam reveals rectus foot type bilateral on clinical and radiographic findings there is hammertoe deformity second toes bilateral right more significant than left with dorsal displacement at MTP joint. There is rigid contracture at the IP joint bilateral some tenderness with prolonged walking shoes and  activities. X-rays reveal inferior copious spurring there is adductovarus rotation lesser digits 4 and 5 the hallux is rectus bilateral with slight extensors of both hallux with hallux abductus interphalangeus being noted.  Neurological: She is alert and oriented to person, place, and time. She has normal strength and normal reflexes.  Intact epicritic and proprioceptive sensations bilateral. Normal plantar response and DTRs.  Skin: Skin is warm and dry. No cyanosis. Nails show no clubbing.  Skin color pigment normal hair growth absent bilateral there is pinch callus of the hallux IP joints bilateral secondary to hallux interphalangeus deformity and extensors deformity at the IP joint.  Psychiatric: She has a normal mood and affect. Her behavior is normal.          Assessment & Plan:  Assessment this time and hammertoe deformity second digits bilateral right more severe than left. Patient's incision surgical intervention at this time. At this time reviewed the procedure risk and. alternatives consent form for hammertoe repair with pin fixation second digits bilateral is reviewed and signed by the patient. All questions asked medication are answered. Surgery likely scheduled [facility] specialty surgical center in December of this year per her convenience. Patient will likely be out of work for approximately six-week duration time she'll be ambulatory in [place] shoes bilateral. Patient understands therapy pin fixation with removal bone and posse subsiding and capsulotomy procedure may be necessary in particular on the right foot.  Alvan Dame DPM

## 2013-06-18 NOTE — Patient Instructions (Signed)
Pre-Operative Instructions  Congratulations, you have decided to take an important step to improving your quality of life.  You can be assured that the doctors of [facility] will be with you every step of the way.  1. Plan to be at the surgery center/hospital at least 1 (one) hour prior to your scheduled time unless otherwise directed by the surgical center/hospital staff.  You must have a responsible adult accompany you, remain during the surgery and drive you home.  Make sure you have directions to the surgical center/hospital and know how to get there on time. 2. For hospital based surgery you will need to obtain a history and physical form from your family physician within 1 month prior to the date of surgery- we will give you a form for you primary physician.  3. We make every effort to accommodate the date you request for surgery.  There are however, times where surgery dates or times have to be moved.  We will contact you as soon as possible if a change in schedule is required.   4. No Aspirin/Ibuprofen for one week before surgery.  If you are on aspirin, any non-steroidal anti-inflammatory medications (Mobic, Aleve, Ibuprofen) you should stop taking it 7 days prior to your surgery.  You make take Tylenol  For pain prior to surgery.  5. Medications- If you are taking daily heart and blood pressure medications, seizure, reflux, allergy, asthma, anxiety, pain or diabetes medications, make sure the surgery center/hospital is aware before the day of surgery so they may notify you which medications to take or avoid the day of surgery. 6. No food or drink after midnight the night before surgery unless directed otherwise by surgical center/hospital staff. 7. No alcoholic beverages 24 hours prior to surgery.  No smoking 24 hours prior to or 24 hours after surgery. 8. Wear loose pants or shorts- loose enough to fit over bandages, boots, and casts. 9. No slip on shoes, sneakers are best. 10. Bring  your boot with you to the surgery center/hospital.  Also bring crutches or a walker if your physician has prescribed it for you.  If you do not have this equipment, it will be provided for you after surgery. 11. If you have not been contracted by the surgery center/hospital by the day before your surgery, call to confirm the date and time of your surgery. 12. Leave-time from work may vary depending on the type of surgery you have.  Appropriate arrangements should be made prior to surgery with your employer. 13. Prescriptions will be provided immediately following surgery by your doctor.  Have these filled as soon as possible after surgery and take the medication as directed. 14. Remove nail polish on the operative foot. 15. Wash the night before surgery.  The night before surgery wash the foot and leg well with the antibacterial soap provided and water paying special attention to beneath the toenails and in between the toes.  Rinse thoroughly with water and dry well with a towel.  Perform this wash unless told not to do so by your physician.  Enclosed: 1 Ice pack (please put in freezer the night before surgery)   1 Hibiclens skin cleaner   Pre-op Instructions  If you have any questions regarding the instructions, do not hesitate to call our office.  [place]: [address] [place], [place] [address] [phone]  [place]: [address] [place], [place] [address] [phone]  [place]: [address]  [place], [place] [address] [phone]  Dr. Ayzia Day   Tuchman DPM, Dr. Norman Regal DPM Dr. Briella Hobday DPM, Dr. M. Todd Hyatt DPM, Dr. Kathryn Egerton DPM 

## 2013-06-26 DIAGNOSIS — M899 Disorder of bone, unspecified: Secondary | ICD-10-CM

## 2013-06-26 DIAGNOSIS — M949 Disorder of cartilage, unspecified: Secondary | ICD-10-CM

## 2013-07-31 ENCOUNTER — Other Ambulatory Visit: Payer: Self-pay | Admitting: Adult Health

## 2013-08-08 DIAGNOSIS — M2040 Other hammer toe(s) (acquired), unspecified foot: Secondary | ICD-10-CM

## 2013-08-20 ENCOUNTER — Ambulatory Visit (INDEPENDENT_AMBULATORY_CARE_PROVIDER_SITE_OTHER): Payer: 59

## 2013-08-20 VITALS — BP 148/94 | HR 80 | Temp 98.9°F | Resp 16 | Ht 67.0 in | Wt 210.0 lb

## 2013-08-20 DIAGNOSIS — M2040 Other hammer toe(s) (acquired), unspecified foot: Secondary | ICD-10-CM

## 2013-08-20 DIAGNOSIS — Z9889 Other specified postprocedural states: Secondary | ICD-10-CM

## 2013-08-20 NOTE — Patient Instructions (Signed)

## 2013-08-20 NOTE — Progress Notes (Signed)
   Subjective:    Patient ID: Courtney Bradley, female    DOB: 28-May-1951, 62 y.o.   MRN: [id number]  "They're good.  That pain medicine made me nauseous."  HPI    Review of Systems deferred at this visit    Objective:   Physical Exam Neurovascular status is intact pedal pulses palpable dressings intact and dry. X-rays reveal good alignment of the digit with intact pin fixation second digit bilateral. Dressings removed incisions clean dry well coapted no dehiscence no open wounds sutures intact and dry. Patient ambulating Darco shoes as instructed.       Assessment & Plan:  Assessment good postop progress a day status post hammertoe repair second digits bilateral. Patient is to continue with air fracture boot or a Darco shoes as instructed dressing was reapplied at this time recheck in one week for suture removal advised to contact me change difficulties and trim. May slightly increased walking activities 12-1508-15 minutes per hour. Reappointed in one week  Alvan Dame DPM

## 2013-08-27 ENCOUNTER — Ambulatory Visit (INDEPENDENT_AMBULATORY_CARE_PROVIDER_SITE_OTHER): Payer: 59

## 2013-08-27 VITALS — BP 128/86 | HR 86 | Resp 12

## 2013-08-27 DIAGNOSIS — Z9889 Other specified postprocedural states: Secondary | ICD-10-CM

## 2013-08-27 DIAGNOSIS — M2040 Other hammer toe(s) (acquired), unspecified foot: Secondary | ICD-10-CM

## 2013-08-27 NOTE — Progress Notes (Signed)
   Subjective:    Patient ID: Courtney Bradley, female    DOB: [birth year], 63 y.o.   MRN: [id number] "They're good."  HPI    Review of Systems deferred at this visit     Objective:   Physical Exam Neurovascular status is intact dressings intact and dry incisions well coapted no dehiscence no discharge or drainage noted mild edema and ecchymosis consistent with 2 weeks postop course. At this time sutures are removed Neosporin Coflex wrap in is done to the second digit buddy strapping to the third. Patient is also dispensed Coflex for use at home. May resume normal bathing and hygiene I will also maintain Ace anklet which is dispensed bilateral at this time. Maintain air fracture shoe as instructed.       Assessment & Plan:  Assessment good postop progress clinically good position of the second digits bilateral pin fixations intact. He wrapping anklet are dispensed reappointed in 3 weeks for followup x-ray and possible pin removal at that time.  Harriet Masson DPM

## 2013-08-27 NOTE — Progress Notes (Signed)
1. Hammer toe repair with pin 2nd toes bilateral ,possible tendon release

## 2013-08-27 NOTE — Patient Instructions (Signed)
ICE INSTRUCTIONS  Apply ice or cold pack to the affected area at least 3 times a day for 10-15 minutes each time.  You should also use ice after prolonged activity or vigorous exercise.  Do not apply ice longer than 20 minutes at one time.  Always keep a cloth between your skin and the ice pack to prevent burns.  Being consistent and following these instructions will help control your symptoms.  We suggest you purchase a gel ice pack because they are reusable and do bit leak.  Some of them are designed to wrap around the area.  Use the method that works best for you.  Here are some other suggestions for icing.   Use a frozen bag of peas or corn-inexpensive and molds well to your body, usually stays frozen for 10 to 20 minutes.  Wet a towel with cold water and squeeze out the excess until it's damp.  Place in a bag in the freezer for 20 minutes. Then remove and use.  Maintain Coflex wrapping on toes as instructed reapply after every bath or shower as needed.  Maintain surgical shoes to keep the toe was protected for 3 more weeks.  Apply Neosporin or to the incisions as instructed. May resume normal bathing or showering power after washing the foot reapply Neosporin and Coflex wrap. Maintain anklet for compression stocking during the day may remove at night while sleeping. However maintain surgical shoe during sleep.

## 2013-08-28 ENCOUNTER — Telehealth: Payer: Self-pay | Admitting: *Deleted

## 2013-08-28 NOTE — Telephone Encounter (Signed)
Pt asked 1.  How long should she be up on her feet at this time in her recovery?  Previous appt he told her 15 minutes/hr.  I told pt, that if Dr Blenda Mounts stated she was progressing normally, then she could increase weight-bearing to 20 minutes/hr, as tolerated.  2.  Pt states she has not noticed any swelling.  Does she need to wear the compression sock?  I told pt, that in most cases as weight-bearing time increased pts noticed more post- op swelliing, that it would be better to wear the compression sock for at least the 1st month, then possibly experiment without it, but post-op swelling could occur up to 6 to 9 month.  3.  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  I told pt she could discuss with Dr Blenda Mounts at the appt in 3 weeks.  Pt states understanding.

## 2013-09-02 NOTE — Progress Notes (Signed)
1) Hammer toe repair 2nd toe bilateral

## 2013-09-09 ENCOUNTER — Telehealth: Payer: Self-pay | Admitting: Adult Health

## 2013-09-09 MED ORDER — ESTRADIOL-LEVONORGESTREL 0.045-0.015 MG/DAY TD PTWK: MEDICATED_PATCH | TRANSDERMAL | Status: DC

## 2013-09-09 NOTE — Telephone Encounter (Signed)
Wants to get patches at [place] and will get labs in near future, CBC,CMP,TSH and lipids

## 2013-09-11 ENCOUNTER — Other Ambulatory Visit: Payer: 59 | Admitting: Adult Health

## 2013-09-16 ENCOUNTER — Other Ambulatory Visit: Payer: Self-pay | Admitting: Adult Health

## 2013-09-18 ENCOUNTER — Ambulatory Visit (INDEPENDENT_AMBULATORY_CARE_PROVIDER_SITE_OTHER): Payer: 59

## 2013-09-18 ENCOUNTER — Other Ambulatory Visit: Payer: Self-pay | Admitting: *Deleted

## 2013-09-18 ENCOUNTER — Telehealth: Payer: Self-pay | Admitting: *Deleted

## 2013-09-18 VITALS — BP 129/75 | HR 129 | Resp 17 | Ht 67.0 in | Wt 210.0 lb

## 2013-09-18 DIAGNOSIS — Z9889 Other specified postprocedural states: Secondary | ICD-10-CM

## 2013-09-18 DIAGNOSIS — M2040 Other hammer toe(s) (acquired), unspecified foot: Secondary | ICD-10-CM

## 2013-09-18 MED ORDER — EFINACONAZOLE 10 % EX SOLN: 1.0000 [drp] | Freq: Every day | CUTANEOUS | Status: DC

## 2013-09-18 NOTE — Telephone Encounter (Signed)
Pt states forgot to pick up rx for fungal toenails.  Dr Blenda Mounts ordered Courtney Bradley.  I informed the pt, we ordered from [facility] and they will contact her.

## 2013-09-18 NOTE — Patient Instructions (Signed)
ICE INSTRUCTIONS  Apply ice or cold pack to the affected area at least 3 times a day for 10-15 minutes each time.  You should also use ice after prolonged activity or vigorous exercise.  Do not apply ice longer than 20 minutes at one time.  Always keep a cloth between your skin and the ice pack to prevent burns.  Being consistent and following these instructions will help control your symptoms.  We suggest you purchase a gel ice pack because they are reusable and do bit leak.  Some of them are designed to wrap around the area.  Use the method that works best for you.  Here are some other suggestions for icing.   Use a frozen bag of peas or corn-inexpensive and molds well to your body, usually stays frozen for 10 to 20 minutes.  Wet a towel with cold water and squeeze out the excess until it's damp.  Place in a bag in the freezer for 20 minutes. Then remove and use.  Maintain Coflex wrap in buddy wrap and the second third and fourth toes if necessary to keep the toes position in a downward are stable position. Maintain Coflex wrap in for at least another month as needed

## 2013-09-18 NOTE — Progress Notes (Signed)
   Subjective:    Patient ID: CHANON LONEY, female    DOB: Nov 16, 1950, 63 y.o.   MRN: [id number] Pt present for POV3, and is wanting to have the pins removed. HPI    Review of Systems no new changes or findings     Objective:   Physical Exam Patient is 5 weeks status post hammertoe repair and tenotomy capsulotomy second toes bilateral. Little or no pain or discomfort x-rays reveal good positioning osteotomies and good consolidation of the fusion site second toes bilateral. Incision is well coapted at this time pins intact no pain tenderness discomfort no dehiscence noted no infection is noted       Assessment & Plan:  Assessment good postop progress radiographically and clinically good clinical alignment of the toes at this time pins are removed Betadine and Band-Aid dressing are applied with continued Coflex wrap in the toes buddy wrap in the second third and posse fourth toes as needed. Reappointed 2 months for long-term followup with in 2 weeks can return to work activities as well as mild exercise activities as tolerated may discontinue surgical shoe and resume athletic or walking shoes at this time no dress shoes heels or flip-flops followup in 2 months for continued postop followup next  Harriet Masson DPM

## 2013-09-23 ENCOUNTER — Telehealth: Payer: Self-pay | Admitting: *Deleted

## 2013-09-23 NOTE — Telephone Encounter (Signed)
Pt states she has not heard from [facility].  I confirmed the Zollie Pee was received by the pharmacy and informed the pt and gave the [facility] phone number.

## 2013-10-07 ENCOUNTER — Other Ambulatory Visit: Payer: Self-pay | Admitting: Obstetrics and Gynecology

## 2013-10-07 DIAGNOSIS — Z1231 Encounter for screening mammogram for malignant neoplasm of breast: Secondary | ICD-10-CM

## 2013-10-08 ENCOUNTER — Ambulatory Visit (HOSPITAL_COMMUNITY)
Admission: RE | Admit: 2013-10-08 | Discharge: 2013-10-08 | Disposition: A | Discharge status: Home / Self Care | Payer: 59 | Source: Ambulatory Visit | Attending: Obstetrics and Gynecology | Admitting: Obstetrics and Gynecology

## 2013-10-08 DIAGNOSIS — Z1231 Encounter for screening mammogram for malignant neoplasm of breast: Secondary | ICD-10-CM | POA: Insufficient documentation

## 2014-06-13 ENCOUNTER — Other Ambulatory Visit: Payer: 59 | Admitting: Adult Health

## 2014-06-23 ENCOUNTER — Emergency Department (HOSPITAL_COMMUNITY)
Admission: EM | Admit: 2014-06-23 | Discharge: 2014-06-23 | Discharge status: Against Medical Advice | Payer: 59 | Attending: Emergency Medicine | Admitting: Emergency Medicine

## 2014-06-23 ENCOUNTER — Encounter (HOSPITAL_COMMUNITY): Payer: Self-pay | Admitting: *Deleted

## 2014-06-23 DIAGNOSIS — R109 Unspecified abdominal pain: Secondary | ICD-10-CM | POA: Diagnosis present

## 2014-06-23 DIAGNOSIS — R112 Nausea with vomiting, unspecified: Secondary | ICD-10-CM | POA: Insufficient documentation

## 2014-06-23 NOTE — ED Notes (Addendum)
abd pain, onset saturdayTook a vicodin  and now feels better.  Nausea,  , vomited x 1    Recently took contrave for binge eating.  And since then problem with constipation.

## 2014-06-27 ENCOUNTER — Other Ambulatory Visit: Payer: 59 | Admitting: Adult Health

## 2014-06-30 ENCOUNTER — Encounter (HOSPITAL_COMMUNITY): Payer: Self-pay | Admitting: Emergency Medicine

## 2014-06-30 ENCOUNTER — Emergency Department (HOSPITAL_COMMUNITY): Payer: 59

## 2014-06-30 ENCOUNTER — Emergency Department (HOSPITAL_COMMUNITY)
Admission: EM | Admit: 2014-06-30 | Discharge: 2014-06-30 | Disposition: A | Discharge status: Home / Self Care | Payer: 59 | Attending: Emergency Medicine | Admitting: Emergency Medicine

## 2014-06-30 DIAGNOSIS — K219 Gastro-esophageal reflux disease without esophagitis: Secondary | ICD-10-CM | POA: Diagnosis not present

## 2014-06-30 DIAGNOSIS — Z880 Allergy status to penicillin: Secondary | ICD-10-CM | POA: Diagnosis not present

## 2014-06-30 DIAGNOSIS — F419 Anxiety disorder, unspecified: Secondary | ICD-10-CM | POA: Diagnosis not present

## 2014-06-30 DIAGNOSIS — F329 Major depressive disorder, single episode, unspecified: Secondary | ICD-10-CM | POA: Insufficient documentation

## 2014-06-30 DIAGNOSIS — Z792 Long term (current) use of antibiotics: Secondary | ICD-10-CM | POA: Diagnosis not present

## 2014-06-30 DIAGNOSIS — Z9049 Acquired absence of other specified parts of digestive tract: Secondary | ICD-10-CM | POA: Insufficient documentation

## 2014-06-30 DIAGNOSIS — Z79899 Other long term (current) drug therapy: Secondary | ICD-10-CM | POA: Diagnosis not present

## 2014-06-30 DIAGNOSIS — N132 Hydronephrosis with renal and ureteral calculous obstruction: Secondary | ICD-10-CM | POA: Diagnosis not present

## 2014-06-30 DIAGNOSIS — Z9889 Other specified postprocedural states: Secondary | ICD-10-CM | POA: Diagnosis not present

## 2014-06-30 DIAGNOSIS — R1032 Left lower quadrant pain: Secondary | ICD-10-CM | POA: Diagnosis present

## 2014-06-30 DIAGNOSIS — Z7982 Long term (current) use of aspirin: Secondary | ICD-10-CM | POA: Diagnosis not present

## 2014-06-30 DIAGNOSIS — K5900 Constipation, unspecified: Secondary | ICD-10-CM | POA: Insufficient documentation

## 2014-06-30 DIAGNOSIS — M899 Disorder of bone, unspecified: Secondary | ICD-10-CM | POA: Diagnosis not present

## 2014-06-30 DIAGNOSIS — M1990 Unspecified osteoarthritis, unspecified site: Secondary | ICD-10-CM | POA: Diagnosis not present

## 2014-06-30 DIAGNOSIS — R109 Unspecified abdominal pain: Secondary | ICD-10-CM

## 2014-06-30 LAB — CBC WITH DIFFERENTIAL/PLATELET
BASOS PCT: 0 % (ref 0–1)
Basophils Absolute: 0 10*3/uL (ref 0.0–0.1)
EOS ABS: 0.2 10*3/uL (ref 0.0–0.7)
Eosinophils Relative: 1 % (ref 0–5)
HCT: 44.5 % (ref 36.0–46.0)
Hemoglobin: 14.9 g/dL (ref 12.0–15.0)
LYMPHS ABS: 1.5 10*3/uL (ref 0.7–4.0)
Lymphocytes Relative: 10 % — ABNORMAL LOW (ref 12–46)
MCH: 28.8 pg (ref 26.0–34.0)
MCHC: 33.5 g/dL (ref 30.0–36.0)
MCV: 86.1 fL (ref 78.0–100.0)
Monocytes Absolute: 1.2 10*3/uL — ABNORMAL HIGH (ref 0.1–1.0)
Monocytes Relative: 8 % (ref 3–12)
NEUTROS PCT: 81 % — AB (ref 43–77)
Neutro Abs: 12 10*3/uL — ABNORMAL HIGH (ref 1.7–7.7)
PLATELETS: 305 10*3/uL (ref 150–400)
RBC: 5.17 MIL/uL — AB (ref 3.87–5.11)
RDW: 12.4 % (ref 11.5–15.5)
WBC: 14.9 10*3/uL — ABNORMAL HIGH (ref 4.0–10.5)

## 2014-06-30 LAB — COMPREHENSIVE METABOLIC PANEL
ALT: 39 U/L — AB (ref 0–35)
AST: 46 U/L — ABNORMAL HIGH (ref 0–37)
Albumin: 3.5 g/dL (ref 3.5–5.2)
Alkaline Phosphatase: 172 U/L — ABNORMAL HIGH (ref 39–117)
Anion gap: 11 (ref 5–15)
BUN: 15 mg/dL (ref 6–23)
CALCIUM: 10.1 mg/dL (ref 8.4–10.5)
CO2: 28 meq/L (ref 19–32)
CREATININE: 1.41 mg/dL — AB (ref 0.50–1.10)
Chloride: 103 mEq/L (ref 96–112)
GFR, EST AFRICAN AMERICAN: 45 mL/min — AB (ref >90)
GFR, EST NON AFRICAN AMERICAN: 39 mL/min — AB (ref >90)
GLUCOSE: 123 mg/dL — AB (ref 70–99)
Potassium: 4.5 mEq/L (ref 3.7–5.3)
SODIUM: 142 meq/L (ref 137–147)
Total Bilirubin: 0.8 mg/dL (ref 0.3–1.2)
Total Protein: 7.9 g/dL (ref 6.0–8.3)

## 2014-06-30 LAB — URINALYSIS, ROUTINE W REFLEX MICROSCOPIC
BILIRUBIN URINE: NEGATIVE
GLUCOSE, UA: NEGATIVE mg/dL
HGB URINE DIPSTICK: NEGATIVE
KETONES UR: NEGATIVE mg/dL
Leukocytes, UA: NEGATIVE
Nitrite: NEGATIVE
PROTEIN: NEGATIVE mg/dL
Specific Gravity, Urine: >1.03 — ABNORMAL HIGH (ref 1.005–1.030)
Urobilinogen, UA: 0.2 mg/dL (ref 0.0–1.0)
pH: 6 (ref 5.0–8.0)

## 2014-06-30 MED ORDER — OXYCODONE-ACETAMINOPHEN 5-325 MG PO TABS
1.0000 | ORAL_TABLET | ORAL | Status: DC | PRN
Start: 2014-06-30 — End: 2014-09-19

## 2014-06-30 MED ORDER — HYDROMORPHONE HCL 1 MG/ML IJ SOLN
1.0000 mg | Freq: Once | INTRAMUSCULAR | Status: AC
  Administered 2014-06-30: 1 mg via INTRAVENOUS
  Filled 2014-06-30: qty 1

## 2014-06-30 MED ORDER — ONDANSETRON HCL 4 MG PO TABS: 4.0000 mg | ORAL_TABLET | Freq: Four times a day (QID) | ORAL | Status: DC

## 2014-06-30 MED ORDER — ONDANSETRON HCL 4 MG/2ML IJ SOLN
4.0000 mg | Freq: Once | INTRAMUSCULAR | Status: AC
  Administered 2014-06-30: 4 mg via INTRAVENOUS
  Filled 2014-06-30: qty 2

## 2014-06-30 MED ORDER — IOHEXOL 300 MG/ML  SOLN
100.0000 mL | Freq: Once | INTRAMUSCULAR | Status: AC | PRN
  Administered 2014-06-30: 100 mL via INTRAVENOUS

## 2014-06-30 MED ORDER — SODIUM CHLORIDE 0.9 % IV BOLUS (SEPSIS)
1000.0000 mL | Freq: Once | INTRAVENOUS | Status: AC
  Administered 2014-06-30: 1000 mL via INTRAVENOUS

## 2014-06-30 MED ORDER — MORPHINE SULFATE 4 MG/ML IJ SOLN
4.0000 mg | Freq: Once | INTRAMUSCULAR | Status: AC
  Administered 2014-06-30: 4 mg via INTRAVENOUS
  Filled 2014-06-30: qty 1

## 2014-06-30 NOTE — ED Provider Notes (Signed)
CSN: [id number]     Arrival date & time 06/30/14  9357 History   First MD Initiated Contact with Patient 06/30/14 [phone]     Chief Complaint  Patient presents with  . Abdominal Pain     (Consider location/radiation/quality/duration/timing/severity/associated sxs/prior Treatment) The history is provided by the patient and the spouse.   Courtney Bradley is a 63 y.o. female presenting with a 1 week history of intermittent left lower quadrant abdominal pain along with increased urinary frequency without dysuria.  She was here last week for the same symptoms which resolved while waiting so left and did not get evaluated.  She developed persistent pain 2 days ago,  called her pcp yesterday who called in cipro for presumptive uti - has taken 2 tablets without improvement. She woke this am with increased constant sharp pain along with nausea and emesis x 3 (nausea now resolved).  She denies fevers or chills, no flank pain, no increased pain with food or drink.  She generally has constipation, her stools have been softer without diarrhea x 2 days.  She has found no alleviators.  Denies history of similar symptoms, no kidney stone or diverticulitis history. She went to a wedding 2 days ago and had 4 glasses of wine.  She does not typically drink etoh.  Her pain became constant later that night.     Past Medical History  Diagnosis Date  . Depression with anxiety   . Arthritis   . Depression   . GERD (gastroesophageal reflux disease)   . Osteopenia    Past Surgical History  Procedure Laterality Date  . Gallbladder surgery    . Neck lift    . Cholecystectomy    . Correction hammer toe Bilateral     2nd   Family History  Problem Relation Age of Onset  . Heart disease    . Cancer    . Diabetes    . Coronary artery disease    . Arthritis    . Heart disease Mother   . Cancer Mother   . Alzheimer's disease Father    History  Substance Use Topics  . Smoking status: Never Smoker   .  Smokeless tobacco: Not on file  . Alcohol Use: Yes     Comment: rarely   OB History    No data available     Review of Systems  Constitutional: Negative for fever.  HENT: Negative.  Negative for sore throat.   Eyes: Negative.   Respiratory: Negative for chest tightness and shortness of breath.   Cardiovascular: Negative for chest pain.  Gastrointestinal: Positive for nausea, vomiting and abdominal pain. Negative for diarrhea.  Genitourinary: Positive for frequency. Negative for dysuria and urgency.  Musculoskeletal: Negative for joint swelling, arthralgias and neck pain.  Skin: Negative.  Negative for rash and wound.  Neurological: Negative for dizziness, weakness, light-headedness, numbness and headaches.  Psychiatric/Behavioral: Negative.       Allergies  Penicillins  Home Medications   Prior to Admission medications   Medication Sig Start Date End Date Taking? Authorizing Provider  Calcium Carbonate-Vit D-Min (GNP CALCIUM PLUS 600 +D PO) Take 1 tablet by mouth daily.    Yes Historical Provider, MD  ciprofloxacin (CIPRO) 250 MG tablet Take 250 mg by mouth 2 (two) times daily.   Yes Historical Provider, MD  estradiol-levonorgestrel [facility] PRO) 0.045-0.015 MG/DAY APPLY ONE [facility] EACH WEEK 09/09/13  Yes Estill Dooms, NP  HYDROcodone-acetaminophen (NORCO/VICODIN) 5-325 MG per tablet Take 1-2 tablets by mouth  every 4 (four) hours as needed for moderate pain.   Yes Historical Provider, MD  ibuprofen (ADVIL,MOTRIN) 200 MG tablet Take 400 mg by mouth every 6 (six) hours as needed for moderate pain.   Yes Historical Provider, MD  Multiple Vitamin (MULTIVITAMIN PO) Take 1 tablet by mouth daily.    Yes Historical Provider, MD  naproxen (NAPROSYN) 500 MG tablet Take 500 mg by mouth daily as needed (back pain).   Yes Historical Provider, MD  pantoprazole (PROTONIX) 40 MG tablet Take 40 mg by mouth daily as needed (acid reflux).   Yes Historical Provider, MD  phentermine 37.5 MG  capsule Take 18.75 mg by mouth every morning.   Yes Historical Provider, MD  sertraline (ZOLOFT) 50 MG tablet Take 50 mg by mouth daily.   Yes Historical Provider, MD  Efinaconazole (JUBLIA) 10 % SOLN Apply 1 drop topically daily. Patient not taking: Reported on 06/30/2014 09/18/13   Harriet Masson, DPM  ondansetron (ZOFRAN) 4 MG tablet Take 1 tablet (4 mg total) by mouth every 6 (six) hours. 06/30/14   Evalee Jefferson, PA-C  oxyCODONE-acetaminophen (PERCOCET/ROXICET) 5-325 MG per tablet Take 1 tablet by mouth every 4 (four) hours as needed. 06/30/14   Evalee Jefferson, PA-C  Sertraline HCl (ZOLOFT PO) Take by mouth.      Historical Provider, MD   BP 131/81 mmHg  Pulse 81  Temp(Src) 98.1 F (36.7 C) (Oral)  Resp 20  Ht 5\' 7"  (1.702 m)  Wt 200 lb (90.719 kg)  BMI 31.32 kg/m2  SpO2 91% Physical Exam  Constitutional: She appears well-developed and well-nourished.  HENT:  Head: Normocephalic and atraumatic.  Eyes: Conjunctivae are normal.  Cardiovascular: Normal rate, regular rhythm and normal heart sounds.   Pulmonary/Chest: Effort normal and breath sounds normal. No respiratory distress.  Abdominal: Soft. Bowel sounds are normal. She exhibits no distension and no mass. There is tenderness in the left lower quadrant. There is no rigidity, no rebound, no guarding and no CVA tenderness.  Pain in llq which is not worsened by palpation.  Musculoskeletal: Normal range of motion.  Neurological: She is alert.  Skin: Skin is warm and dry.  Psychiatric: She has a normal mood and affect.  Nursing note and vitals reviewed.   ED Course  Procedures (including critical care time) Labs Review Labs Reviewed  URINALYSIS, ROUTINE W REFLEX MICROSCOPIC - Abnormal; Notable for the following:    Specific Gravity, Urine >1.030 (*)    All other components within normal limits  COMPREHENSIVE METABOLIC PANEL - Abnormal; Notable for the following:    Glucose, Bld 123 (*)    Creatinine, Ser 1.41 (*)    AST 46 (*)     ALT 39 (*)    Alkaline Phosphatase 172 (*)    GFR calc non Af Amer 39 (*)    GFR calc Af Amer 45 (*)    All other components within normal limits  CBC WITH DIFFERENTIAL - Abnormal; Notable for the following:    WBC 14.9 (*)    RBC 5.17 (*)    Neutrophils Relative % 81 (*)    Neutro Abs 12.0 (*)    Lymphocytes Relative 10 (*)    Monocytes Absolute 1.2 (*)    All other components within normal limits    Imaging Review Ct Abdomen Pelvis W Contrast  06/30/2014   CLINICAL DATA:  Worsening LEFT lower quadrant pain for 2 days, nausea, loose stools  EXAM: CT ABDOMEN AND PELVIS WITH CONTRAST  TECHNIQUE: Multidetector CT imaging  of the abdomen and pelvis was performed using the standard protocol following bolus administration of intravenous contrast. Sagittal and coronal MPR images reconstructed from axial data set.  CONTRAST:  165mL OMNIPAQUE IOHEXOL 300 MG/ML SOLN IV. Dilute oral contrast.  COMPARISON:  None  FINDINGS: Minimal dependent bibasilar atelectasis.  LEFT hydronephrosis and delay in LEFT nephrogram due to a 7 x 5 x 9 mm LEFT UPJ calculus.  Significant peripelvic edema, difficult to exclude forniceal rupture.  Mild perinephric edema.  Cyst at upper pole LEFT kidney, 5.1 x 3.8 x 4.6 cm.  Tiny nonobstructing calculus at lower pole LEFT kidney.  Unremarkable ureters and bladder.  3.0 x 2.4 x 3.1 cm diameter heterogeneous mass within LEFT adrenal gland, demonstrating greater than 50% washout on delayed images consistent with adrenal adenoma.  Gallbladder surgically absent.  Question tiny 7 mm cyst at upper pole RIGHT kidney.  Liver, spleen, pancreas, and RIGHT kidney otherwise normal appearance.  Probable tiny splenule at splenic hilum.  Small cyst within RIGHT ovary 2.8 x 1.5 cm image 72.  Uterus and adnexae otherwise unremarkable.  Normal appendix.  Redundant sigmoid colon.  Stomach and bowel loops otherwise unremarkable.  No mass, adenopathy, free fluid, or free air.  Minimal sclerosis at the LEFT  pubic body.  7 mm lucent focus within T12 vertebral body, hyperintense on all sequences on prior lumbar MR likely small vertebral hemangioma.  Bones otherwise unremarkable.  Small umbilical hernia containing fat.  IMPRESSION: LEFT hydronephrosis and delay in LEFT nephrogram secondary to a 7 x 5 x 9 mm LEFT UPJ calculus.  Tiny nonobstructing LEFT renal calculus and 5.1 cm LEFT renal cyst as well.  Significant peripelvic edema LEFT kidney, unable to exclude forniceal rupture.  LEFT adrenal mass with washout characteristics most consistent with an adrenal adenoma.  Umbilical hernia containing fat.   Electronically Signed   By: Lavonia Dana M.D.   On: 06/30/2014 12:42     EKG Interpretation None      MDM   Final diagnoses:  Abdominal pain  Ureteral stone with hydronephrosis    Patients labs and/or radiological studies were viewed and considered during the medical decision making and disposition process. Pt discussed with Dr. Jeffie Pollock of urology who also viewed CT scan.  Advised f/u in am as outpatient with him or partner since patient is pain free (resolved with dilaudid IV x 1).  Labs and Ct findings reviewed with patient.  Advised close f/u.  Call placed to scheduler - appt with Dr. Diona Fanti in am at 10:45 in [place] office.  In the interim, advised return here for worsened pain, uncontrolled vomiting, fever.  Pt agrees with and understands plan.  Prescribed oxycodone, zofran.  The patient appears reasonably screened and/or stabilized for discharge and I doubt any other medical condition or other [facility] requiring further screening, evaluation, or treatment in the ED at this time prior to discharge.     Evalee Jefferson, PA-C 06/30/14 2115  Ephraim Hamburger, MD 07/01/14 [phone]

## 2014-06-30 NOTE — ED Notes (Addendum)
Patient complaining of lower abdominal pain off and on x 1 week with constant pain x 2 days. Also complaining of vomiting starting today. Patient states she took hydrocodone at 0630 this morning.

## 2014-06-30 NOTE — ED Notes (Signed)
Appt made with [facility] with Dr. Claris Che 11/10 at 10:15  across [address].

## 2014-06-30 NOTE — Discharge Instructions (Signed)

## 2014-07-01 ENCOUNTER — Other Ambulatory Visit: Payer: Self-pay | Admitting: Urology

## 2014-07-01 ENCOUNTER — Ambulatory Visit (INDEPENDENT_AMBULATORY_CARE_PROVIDER_SITE_OTHER): Payer: 59 | Admitting: Urology

## 2014-07-01 DIAGNOSIS — N201 Calculus of ureter: Secondary | ICD-10-CM

## 2014-07-02 ENCOUNTER — Other Ambulatory Visit: Payer: Self-pay | Admitting: Urology

## 2014-07-02 ENCOUNTER — Encounter (HOSPITAL_COMMUNITY): Payer: Self-pay

## 2014-07-03 ENCOUNTER — Ambulatory Visit (HOSPITAL_COMMUNITY)
Admission: RE | Admit: 2014-07-03 | Discharge: 2014-07-03 | Disposition: A | Discharge status: Home / Self Care | Payer: 59 | Source: Ambulatory Visit | Attending: Urology | Admitting: Urology

## 2014-07-03 ENCOUNTER — Encounter (HOSPITAL_COMMUNITY)
Admission: RE | Disposition: A | Discharge status: Home / Self Care | Payer: Self-pay | Source: Ambulatory Visit | Attending: Urology

## 2014-07-03 ENCOUNTER — Ambulatory Visit (HOSPITAL_COMMUNITY): Payer: 59

## 2014-07-03 ENCOUNTER — Encounter (HOSPITAL_COMMUNITY): Payer: Self-pay | Admitting: General Practice

## 2014-07-03 DIAGNOSIS — K219 Gastro-esophageal reflux disease without esophagitis: Secondary | ICD-10-CM | POA: Insufficient documentation

## 2014-07-03 DIAGNOSIS — N132 Hydronephrosis with renal and ureteral calculous obstruction: Secondary | ICD-10-CM | POA: Insufficient documentation

## 2014-07-03 DIAGNOSIS — M1990 Unspecified osteoarthritis, unspecified site: Secondary | ICD-10-CM | POA: Diagnosis not present

## 2014-07-03 DIAGNOSIS — N201 Calculus of ureter: Secondary | ICD-10-CM

## 2014-07-03 SURGERY — LITHOTRIPSY, ESWL
Anesthesia: LOCAL | Laterality: Left

## 2014-07-03 MED ORDER — SODIUM CHLORIDE 0.9 % IV SOLN
INTRAVENOUS | Status: DC
  Administered 2014-07-03: 16:00:00 via INTRAVENOUS

## 2014-07-03 MED ORDER — DIPHENHYDRAMINE HCL 25 MG PO CAPS
25.0000 mg | ORAL_CAPSULE | ORAL | Status: AC
Start: 2014-07-03 — End: 2014-07-03
  Administered 2014-07-03: 25 mg via ORAL
  Filled 2014-07-03: qty 1

## 2014-07-03 MED ORDER — DIAZEPAM 5 MG PO TABS
10.0000 mg | ORAL_TABLET | ORAL | Status: AC
  Administered 2014-07-03: 10 mg via ORAL
  Filled 2014-07-03: qty 2

## 2014-07-03 MED ORDER — TAMSULOSIN HCL 0.4 MG PO CAPS: 0.4000 mg | ORAL_CAPSULE | Freq: Every day | ORAL | Status: DC

## 2014-07-03 MED ORDER — CIPROFLOXACIN HCL 500 MG PO TABS
500.0000 mg | ORAL_TABLET | ORAL | Status: AC
  Administered 2014-07-03: 500 mg via ORAL
  Filled 2014-07-03: qty 1

## 2014-07-03 NOTE — Discharge Instructions (Signed)
See [facility] discharge instructions in chart.  

## 2014-07-03 NOTE — H&P (Signed)
History of Present Illness  This 63 year old female comes in today, urgently sent for follow-up of a moderate size left proximal ureteral stone.  She has had intermittent back pain for a couple of weeks, but yesterday he experienced sudden onset of flank pain, nausea and vomiting.  CT was performed revealing a 7 x 9 x 5 mm left proximal ureteral stone with smaller left lower pole calculi and hydronephrosis.  Other incidental findings were noted on the CT scan.  She has had no fever or chills.  She is fairly comfortable now, and was given oxycodone to take for her pain.  She denies frequent urinary tract infections.  This is her first episode of a stone.   Past Medical History  1. History of arthritis (Z87.39)  2. History of esophageal reflux (Z87.19)  Surgical History  1. History of Cholecystectomy  2. History of Joint Fusion For Hammertoe  Current Meds  1. Calcium TABS;  Therapy: (Recorded:10Nov2015) to Recorded  2. Naproxen TABS;  Therapy: (Recorded:10Nov2015) to Recorded  3. [place];  Therapy: (Recorded:10Nov2015) to Recorded  4. Zoloft 50 MG Oral Tablet;  Therapy: (Recorded:10Nov2015) to Recorded  Family History  1. Family history of Death : Mother, Father  2. Family history of Alzheimer's disease (Z82.0) : Father  3. Family history of cardiac disorder (Z82.49) : Mother  4. Family history of malignant neoplasm of breast (Z80.3) : Mother  5. Family history of prostate cancer (Z80.42) : Father  6. Family history of Recurrent nephrolithiasis : Sibling  Social History   Alcohol use (F10.99)   once a month   Caffeine use (F15.90)   Married   Never used tobacco (Z78.9)   Number of children   2 Daughter   Retired   Therapist, music  Review of Systems Genitourinary, constitutional, skin, eye, otolaryngeal, hematologic/lymphatic, cardiovascular, pulmonary, endocrine, musculoskeletal, gastrointestinal, neurological and psychiatric system(s) were reviewed and  pertinent findings if present are noted and are otherwise negative.  Genitourinary: no hematuria.  Gastrointestinal: no nausea, no flank pain, no abdominal pain and no heartburn.    Vitals Vital Signs [Data Includes: Last 1 Day]  Recorded: [id number] 10:43AM  Height: 5 ft 7 in Weight: 200 lb  BMI Calculated: 31.32 BSA Calculated: 2.02 Blood Pressure: 159 / 93 Temperature: 98.5 F Heart Rate: 84  Physical Exam Constitutional: Well nourished and well developed . No acute distress.  ENT:. The ears and nose are normal in appearance.  Neck: The appearance of the neck is normal and no neck mass is present.  Pulmonary: No respiratory distress and normal respiratory rhythm and effort.  Cardiovascular: Heart rate and rhythm are normal . No peripheral edema.  Abdomen: The abdomen is mildly obese. The abdomen is soft and nontender. No masses are palpated. No CVA tenderness. No hernias are palpable. No hepatosplenomegaly noted.  Lymphatics: The femoral and inguinal nodes are not enlarged or tender.  Skin: Normal skin turgor, no visible rash and no visible skin lesions.  Neuro/Psych:. Mood and affect are appropriate.    Results/Data  Urinalysis revealed microscopic hematuria.  I reviewed CT images with the patient.  The same mentioned left ureteral stone was present.     Assessment  1. Ureteral calculus, left (N20.1)   Left proximal ureteral stone, patient fairly comfortable currently and there is no evidence of complications from the stone other than left hydronephrosis.   Plan Ureteral calculus, left   1. Follow-up Schedule Surgery Office  Follow-up  Status: Hold For - Appointment   Requested for:  [id number]  2. UA With REFLEX; [Do Not Release]; Status:Hold For - Specimen/Data  Collection,Appointment; Requested for:10Nov2015;   Discussion/Summary  I discussed management of this proximal stone with her.  Hounsfield units of the stone measure proximally 600, but this stone is  evident/visible on her CT scalp film.  I think she is a good candidate for shockwave lithotripsy.  I discussed the procedure with the patient, and would like to proceed as soon as possible.  We will get that scheduled in a couple of days in [place].  I will have one of my associates perform this, as I am not working that day.  T

## 2014-07-03 NOTE — Op Note (Signed)
See [facility] OP note scanned into chart. 

## 2014-09-08 ENCOUNTER — Telehealth: Payer: Self-pay | Admitting: Adult Health

## 2014-09-08 DIAGNOSIS — Z139 Encounter for screening, unspecified: Secondary | ICD-10-CM

## 2014-09-08 NOTE — Telephone Encounter (Signed)
Will come Wednesday for labs, order in

## 2014-09-10 ENCOUNTER — Other Ambulatory Visit: Payer: Self-pay | Admitting: Adult Health

## 2014-09-10 ENCOUNTER — Other Ambulatory Visit: Payer: 59

## 2014-09-10 DIAGNOSIS — Z1329 Encounter for screening for other suspected endocrine disorder: Secondary | ICD-10-CM

## 2014-09-10 DIAGNOSIS — Z0189 Encounter for other specified special examinations: Secondary | ICD-10-CM

## 2014-09-10 DIAGNOSIS — Z139 Encounter for screening, unspecified: Secondary | ICD-10-CM

## 2014-09-10 DIAGNOSIS — Z13220 Encounter for screening for lipoid disorders: Secondary | ICD-10-CM

## 2014-09-10 LAB — COMPREHENSIVE METABOLIC PANEL
ALT: 17 U/L (ref 0–35)
AST: 20 U/L (ref 0–37)
Albumin: 3.9 g/dL (ref 3.5–5.2)
Alkaline Phosphatase: 106 U/L (ref 39–117)
BUN: 14 mg/dL (ref 6–23)
CO2: 25 mEq/L (ref 19–32)
Calcium: 10.1 mg/dL (ref 8.4–10.5)
Chloride: 107 mEq/L (ref 96–112)
Creat: 0.87 mg/dL (ref 0.50–1.10)
Glucose, Bld: 83 mg/dL (ref 70–99)
Potassium: 5.4 mEq/L — ABNORMAL HIGH (ref 3.5–5.3)
Sodium: 142 mEq/L (ref 135–145)
TOTAL PROTEIN: 7.2 g/dL (ref 6.0–8.3)
Total Bilirubin: 0.7 mg/dL (ref 0.2–1.2)

## 2014-09-10 LAB — LIPID PANEL
Cholesterol: 262 mg/dL — ABNORMAL HIGH (ref 0–200)
HDL: 62 mg/dL (ref >39)
LDL Cholesterol: 177 mg/dL — ABNORMAL HIGH (ref 0–99)
TRIGLYCERIDES: 116 mg/dL (ref <150)
Total CHOL/HDL Ratio: 4.2 Ratio
VLDL: 23 mg/dL (ref 0–40)

## 2014-09-10 LAB — CBC
HCT: 43 % (ref 36.0–46.0)
HEMOGLOBIN: 14.2 g/dL (ref 12.0–15.0)
MCH: 27.3 pg (ref 26.0–34.0)
MCHC: 33 g/dL (ref 30.0–36.0)
MCV: 82.5 fL (ref 78.0–100.0)
MPV: 9.9 fL (ref 8.6–12.4)
PLATELETS: 336 10*3/uL (ref 150–400)
RBC: 5.21 MIL/uL — AB (ref 3.87–5.11)
RDW: 13.6 % (ref 11.5–15.5)
WBC: 6 10*3/uL (ref 4.0–10.5)

## 2014-09-10 LAB — TSH: TSH: 1.063 u[IU]/mL (ref 0.350–4.500)

## 2014-09-11 ENCOUNTER — Telehealth: Payer: Self-pay | Admitting: Adult Health

## 2014-09-11 NOTE — Telephone Encounter (Signed)
Left message to call about labs 

## 2014-09-11 NOTE — Telephone Encounter (Signed)
Pt aware of labs, will try diet changes and weight loss and recheck labs in 3 months

## 2014-09-19 ENCOUNTER — Encounter: Payer: Self-pay | Admitting: Adult Health

## 2014-09-19 ENCOUNTER — Other Ambulatory Visit (HOSPITAL_COMMUNITY)
Admission: RE | Admit: 2014-09-19 | Discharge: 2014-09-19 | Disposition: A | Discharge status: Home / Self Care | Payer: 59 | Source: Ambulatory Visit | Attending: Adult Health | Admitting: Adult Health

## 2014-09-19 ENCOUNTER — Ambulatory Visit (INDEPENDENT_AMBULATORY_CARE_PROVIDER_SITE_OTHER): Payer: 59 | Admitting: Adult Health

## 2014-09-19 VITALS — BP 120/84 | HR 72 | Ht 66.25 in | Wt 221.4 lb

## 2014-09-19 DIAGNOSIS — Z01419 Encounter for gynecological examination (general) (routine) without abnormal findings: Secondary | ICD-10-CM

## 2014-09-19 DIAGNOSIS — Z1151 Encounter for screening for human papillomavirus (HPV): Secondary | ICD-10-CM | POA: Insufficient documentation

## 2014-09-19 DIAGNOSIS — Z1212 Encounter for screening for malignant neoplasm of rectum: Secondary | ICD-10-CM

## 2014-09-19 DIAGNOSIS — Z79890 Hormone replacement therapy (postmenopausal): Secondary | ICD-10-CM | POA: Insufficient documentation

## 2014-09-19 HISTORY — DX: Hormone replacement therapy: Z79.890

## 2014-09-19 LAB — HEMOCCULT GUIAC POC 1CARD (OFFICE): Fecal Occult Blood, POC: NEGATIVE

## 2014-09-19 NOTE — Progress Notes (Signed)
Patient ID: Courtney Bradley, female   DOB: 1951-06-03, 64 y.o.   MRN: [id number] History of Present Illness: Courtney Bradley is a 64 year old white female,married, in for pap and physical.She had labs earlier this week is aware of results, and she started weight watchers this week. She is still on Combipatch but is weaning down.  Current Medications, Allergies, Past Medical History, Past Surgical History, Family History and Social History were reviewed in [employer] record.     Review of Systems: Patient denies any headaches, blurred vision, shortness of breath, chest pain, abdominal pain, problems with bowel movements, urination, or intercourse. No joint pain or mood swings.She walked 4 miles this morning before coming in.Has had kidney stones recently.    Physical Exam:BP 120/84 mmHg  Pulse 72  Ht 5' 6.25" (1.683 m)  Wt 221 lb 6.4 oz (100.426 kg)  BMI 35.46 kg/m2 General:  Well developed, well nourished, no acute distress Skin:  Warm and dry Neck:  Midline trachea, normal thyroid Lungs; Clear to auscultation bilaterally Breast:  No dominant palpable mass, retraction, or nipple discharge Cardiovascular: Regular rate and rhythm Abdomen:  Soft, non tender, no hepatosplenomegaly Pelvic:  External genitalia is normal in appearance, no lesions.  The vagina is normal in appearance.       The cervix is bulbous and smooth and pap with HPV performed.Marland Kitchen  Uterus is felt to be normal size, shape, and contour.  No  adnexal masses or tenderness noted. Rectal: Good sphincter tone, no polyps, or hemorrhoids felt.  Hemoccult negative. Extremities:  No swelling or varicosities noted Psych:  No mood changes,alert and cooperative,seems happy   Impression: Well woman gyn exam with pap HRT   Plan: Call if needs refills on Combi patch Physical in 1 year Mammogram yearly Colonoscopy per GI Continue weight loss, review handouts Recheck CMP and Lipids in 3 months

## 2014-09-19 NOTE — Patient Instructions (Signed)
Physical in 1 year Mammogram yearly Colonoscopy per GI Exercise to Lose Weight Exercise and a healthy diet may help you lose weight. Your doctor may suggest specific exercises. EXERCISE IDEAS AND TIPS  Choose low-cost things you enjoy doing, such as walking, bicycling, or exercising to workout videos.  Take stairs instead of the elevator.  Walk during your lunch break.  Park your car further away from work or school.  Go to a gym or an exercise class.  Start with 5 to 10 minutes of exercise each day. Build up to 30 minutes of exercise 4 to 6 days a week.  Wear shoes with good support and comfortable clothes.  Stretch before and after working out.  Work out until you breathe harder and your heart beats faster.  Drink extra water when you exercise.  Do not do so much that you hurt yourself, feel dizzy, or get very short of breath. Exercises that burn about 150 calories:  Running 1  miles in 15 minutes.  Playing volleyball for 45 to 60 minutes.  Washing and waxing a car for 45 to 60 minutes.  Playing touch football for 45 minutes.  Walking 1  miles in 35 minutes.  Pushing a stroller 1  miles in 30 minutes.  Playing basketball for 30 minutes.  Raking leaves for 30 minutes.  Bicycling 5 miles in 30 minutes.  Walking 2 miles in 30 minutes.  Dancing for 30 minutes.  Shoveling snow for 15 minutes.  Swimming laps for 20 minutes.  Walking up stairs for 15 minutes.  Bicycling 4 miles in 15 minutes.  Gardening for 30 to 45 minutes.  Jumping rope for 15 minutes.  Washing windows or floors for 45 to 60 minutes. Document Released: 09/10/2010 Document Revised: 10/31/2011 Document Reviewed: 09/10/2010 [facility] Patient Information 2015 [place], [place]. This information is not intended to replace advice given to you by your health care provider. Make sure you discuss any questions you have with your health care provider. Calorie Counting for Weight Loss Calories  are energy you get from the things you eat and drink. Your body uses this energy to keep you going throughout the day. The number of calories you eat affects your weight. When you eat more calories than your body needs, your body stores the extra calories as fat. When you eat fewer calories than your body needs, your body burns fat to get the energy it needs. Calorie counting means keeping track of how many calories you eat and drink each day. If you make sure to eat fewer calories than your body needs, you should lose weight. In order for calorie counting to work, you will need to eat the number of calories that are right for you in a day to lose a healthy amount of weight per week. A healthy amount of weight to lose per week is usually 1-2 lb (0.5-0.9 kg). A dietitian can determine how many calories you need in a day and give you suggestions on how to reach your calorie goal.  WHAT IS MY MY PLAN? My goal is to have __________ calories per day.  If I have this many calories per day, I should lose around __________ pounds per week. WHAT DO I NEED TO KNOW ABOUT CALORIE COUNTING? In order to meet your daily calorie goal, you will need to:  Find out how many calories are in each food you would like to eat. Try to do this before you eat.  Decide how much of the food you  can eat.  Write down what you ate and how many calories it had. Doing this is called keeping a food log. WHERE DO I FIND CALORIE INFORMATION? The number of calories in a food can be found on a Nutrition Facts label. Note that all the information on a label is based on a specific serving of the food. If a food does not have a Nutrition Facts label, try to look up the calories online or ask your dietitian for help. HOW DO I DECIDE HOW MUCH TO EAT? To decide how much of the food you can eat, you will need to consider both the number of calories in one serving and the size of one serving. This information can be found on the Nutrition Facts  label. If a food does not have a Nutrition Facts label, look up the information online or ask your dietitian for help. Remember that calories are listed per serving. If you choose to have more than one serving of a food, you will have to multiply the calories per serving by the amount of servings you plan to eat. For example, the label on a package of bread might say that a serving size is 1 slice and that there are 90 calories in a serving. If you eat 1 slice, you will have eaten 90 calories. If you eat 2 slices, you will have eaten 180 calories. HOW DO I KEEP A FOOD LOG? After each meal, record the following information in your food log:  What you ate.  How much of it you ate.  How many calories it had.  Then, add up your calories. Keep your food log near you, such as in a small notebook in your pocket. Another option is to use a mobile app or website. Some programs will calculate calories for you and show you how many calories you have left each time you add an item to the log. WHAT ARE SOME CALORIE COUNTING TIPS?  Use your calories on foods and drinks that will fill you up and not leave you hungry. Some examples of this include foods like nuts and nut butters, vegetables, lean proteins, and high-fiber foods (more than 5 g fiber per serving).  Eat nutritious foods and avoid empty calories. Empty calories are calories you get from foods or beverages that do not have many nutrients, such as candy and soda. It is better to have a nutritious high-calorie food (such as an avocado) than a food with few nutrients (such as a bag of chips).  Know how many calories are in the foods you eat most often. This way, you do not have to look up how many calories they have each time you eat them.  Look out for foods that may seem like low-calorie foods but are really high-calorie foods, such as baked goods, soda, and fat-free candy.  Pay attention to calories in drinks. Drinks such as sodas, specialty coffee  drinks, alcohol, and juices have a lot of calories yet do not fill you up. Choose low-calorie drinks like water and diet drinks.  Focus your calorie counting efforts on higher calorie items. Logging the calories in a garden salad that contains only vegetables is less important than calculating the calories in a milk shake.  Find a way of tracking calories that works for you. Get creative. Most people who are successful find ways to keep track of how much they eat in a day, even if they do not count every calorie. WHAT ARE SOME PORTION CONTROL  TIPS?  Know how many calories are in a serving. This will help you know how many servings of a certain food you can have.  Use a measuring cup to measure serving sizes. This is helpful when you start out. With time, you will be able to estimate serving sizes for some foods.  Take some time to put servings of different foods on your favorite plates, bowls, and cups so you know what a serving looks like.  Try not to eat straight from a bag or box. Doing this can lead to overeating. Put the amount you would like to eat in a cup or on a plate to make sure you are eating the right portion.  Use smaller plates, glasses, and bowls to prevent overeating. This is a quick and easy way to practice portion control. If your plate is smaller, less food can fit on it.  Try not to multitask while eating, such as watching TV or using your computer. If it is time to eat, sit down at a table and enjoy your food. Doing this will help you to start recognizing when you are full. It will also make you more aware of what and how much you are eating. HOW CAN I CALORIE COUNT WHEN EATING OUT?  Ask for smaller portion sizes or child-sized portions.  Consider sharing an entree and sides instead of getting your own entree.  If you get your own entree, eat only half. Ask for a box at the beginning of your meal and put the rest of your entree in it so you are not tempted to eat  it.  Look for the calories on the menu. If calories are listed, choose the lower calorie options.  Choose dishes that include vegetables, fruits, whole grains, low-fat dairy products, and lean protein. Focusing on smart food choices from each of the 5 food groups can help you stay on track at restaurants.  Choose items that are boiled, broiled, grilled, or steamed.  Choose water, milk, unsweetened iced tea, or other drinks without added sugars. If you want an alcoholic beverage, choose a lower calorie option. For example, a regular margarita can have up to 700 calories and a glass of wine has around 150.  Stay away from items that are buttered, battered, fried, or served with cream sauce. Items labeled "crispy" are usually fried, unless stated otherwise.  Ask for dressings, sauces, and syrups on the side. These are usually very high in calories, so do not eat much of them.  Watch out for salads. Many people think salads are a healthy option, but this is often not the case. Many salads come with bacon, fried chicken, lots of cheese, fried chips, and dressing. All of these items have a lot of calories. If you want a salad, choose a garden salad and ask for grilled meats or steak. Ask for the dressing on the side, or ask for olive oil and vinegar or lemon to use as dressing.  Estimate how many servings of a food you are given. For example, a serving of cooked rice is  cup or about the size of half a tennis ball or one cupcake wrapper. Knowing serving sizes will help you be aware of how much food you are eating at restaurants. The list below tells you how big or small some common portion sizes are based on everyday objects.  1 oz--4 stacked dice.  3 oz--1 deck of cards.  1 tsp--1 dice.  1 Tbsp-- a Ping-Pong ball.  2 Tbsp--1 Ping-Pong ball.   cup--1 tennis ball or 1 cupcake wrapper.  1 cup--1 baseball. Document Released: 08/08/2005 Document Revised: 12/23/2013 Document Reviewed:  06/13/2013 [facility] Patient Information 2015 [place], [place]. This information is not intended to replace advice given to you by your health care provider. Make sure you discuss any questions you have with your health care provider.  recheck labs in 3 months

## 2014-09-22 LAB — CYTOLOGY - PAP

## 2014-09-23 ENCOUNTER — Ambulatory Visit (HOSPITAL_COMMUNITY)
Admission: RE | Admit: 2014-09-23 | Discharge: 2014-09-23 | Disposition: A | Discharge status: Home / Self Care | Payer: 59 | Source: Ambulatory Visit | Attending: Pulmonary Disease | Admitting: Pulmonary Disease

## 2014-09-23 ENCOUNTER — Other Ambulatory Visit (HOSPITAL_COMMUNITY): Payer: Self-pay | Admitting: Pulmonary Disease

## 2014-09-23 DIAGNOSIS — W010XXA Fall on same level from slipping, tripping and stumbling without subsequent striking against object, initial encounter: Secondary | ICD-10-CM | POA: Diagnosis not present

## 2014-09-23 DIAGNOSIS — R0781 Pleurodynia: Secondary | ICD-10-CM | POA: Diagnosis present

## 2014-09-23 DIAGNOSIS — T1490XA Injury, unspecified, initial encounter: Secondary | ICD-10-CM

## 2014-09-23 DIAGNOSIS — Y9302 Activity, running: Secondary | ICD-10-CM | POA: Insufficient documentation

## 2014-09-23 DIAGNOSIS — S2232XA Fracture of one rib, left side, initial encounter for closed fracture: Secondary | ICD-10-CM | POA: Insufficient documentation

## 2014-09-24 ENCOUNTER — Telehealth: Payer: Self-pay | Admitting: Adult Health

## 2014-09-24 NOTE — Telephone Encounter (Signed)
Called pt to discuss ways to prevent kidney stones, like increasing water, lemonade,decrease salt decrease fatty meats, she was home has 3 fx ribs fell walking 09/22/14

## 2014-10-06 ENCOUNTER — Other Ambulatory Visit: Payer: Self-pay | Admitting: Adult Health

## 2014-10-06 ENCOUNTER — Ambulatory Visit (HOSPITAL_COMMUNITY)
Admission: RE | Admit: 2014-10-06 | Discharge: 2014-10-06 | Disposition: A | Discharge status: Home / Self Care | Payer: 59 | Source: Ambulatory Visit | Attending: Adult Health | Admitting: Adult Health

## 2014-10-06 DIAGNOSIS — Z1231 Encounter for screening mammogram for malignant neoplasm of breast: Secondary | ICD-10-CM | POA: Diagnosis present

## 2014-10-14 ENCOUNTER — Other Ambulatory Visit: Payer: Self-pay | Admitting: Adult Health

## 2014-11-17 ENCOUNTER — Other Ambulatory Visit: Payer: Self-pay | Admitting: Adult Health

## 2015-01-06 ENCOUNTER — Other Ambulatory Visit: Payer: Self-pay | Admitting: *Deleted

## 2015-01-06 ENCOUNTER — Telehealth: Payer: Self-pay | Admitting: Orthopedic Surgery

## 2015-01-06 MED ORDER — NAPROXEN 500 MG PO TABS: 500.0000 mg | ORAL_TABLET | Freq: Every day | ORAL | Status: DC | PRN

## 2015-01-06 NOTE — Telephone Encounter (Signed)
yes

## 2015-01-06 NOTE — Telephone Encounter (Signed)
Okay to refill? 

## 2015-01-06 NOTE — Telephone Encounter (Signed)
Med sent to pharmacy.

## 2015-01-06 NOTE — Telephone Encounter (Signed)
Courtney Bradley is calling requesting a med refill on naproxen (NAPROSYN) 500 MG tablet , please advise?

## 2015-02-26 ENCOUNTER — Other Ambulatory Visit: Payer: Self-pay | Admitting: Adult Health

## 2015-02-26 DIAGNOSIS — Z13220 Encounter for screening for lipoid disorders: Secondary | ICD-10-CM

## 2015-02-26 DIAGNOSIS — Z139 Encounter for screening, unspecified: Secondary | ICD-10-CM

## 2015-02-26 DIAGNOSIS — Z1329 Encounter for screening for other suspected endocrine disorder: Secondary | ICD-10-CM

## 2015-03-03 ENCOUNTER — Telehealth: Payer: Self-pay | Admitting: Adult Health

## 2015-03-03 LAB — LIPID PANEL
CHOL/HDL RATIO: 4.8 ratio — AB (ref 0.0–4.4)
Cholesterol, Total: 305 mg/dL — ABNORMAL HIGH (ref 100–199)
HDL: 63 mg/dL (ref >39)
LDL CALC: 215 mg/dL — AB (ref 0–99)
TRIGLYCERIDES: 133 mg/dL (ref 0–149)
VLDL Cholesterol Cal: 27 mg/dL (ref 5–40)

## 2015-03-03 LAB — COMPREHENSIVE METABOLIC PANEL
A/G RATIO: 1.4 (ref 1.1–2.5)
ALK PHOS: 136 IU/L — AB (ref 39–117)
ALT: 25 IU/L (ref 0–32)
AST: 28 IU/L (ref 0–40)
Albumin: 4.3 g/dL (ref 3.6–4.8)
BUN/Creatinine Ratio: 9 — ABNORMAL LOW (ref 11–26)
BUN: 10 mg/dL (ref 8–27)
Bilirubin Total: 0.6 mg/dL (ref 0.0–1.2)
CALCIUM: 10.4 mg/dL — AB (ref 8.7–10.3)
CHLORIDE: 106 mmol/L (ref 97–108)
CO2: 22 mmol/L (ref 18–29)
CREATININE: 1.07 mg/dL — AB (ref 0.57–1.00)
GFR calc non Af Amer: 55 mL/min/{1.73_m2} — ABNORMAL LOW (ref >59)
GFR, EST AFRICAN AMERICAN: 64 mL/min/{1.73_m2} (ref >59)
GLOBULIN, TOTAL: 3 g/dL (ref 1.5–4.5)
Glucose: 91 mg/dL (ref 65–99)
Potassium: 5.7 mmol/L — ABNORMAL HIGH (ref 3.5–5.2)
Sodium: 143 mmol/L (ref 134–144)
Total Protein: 7.3 g/dL (ref 6.0–8.5)

## 2015-03-03 LAB — CBC
Hematocrit: 47.9 % — ABNORMAL HIGH (ref 34.0–46.6)
Hemoglobin: 16.2 g/dL — ABNORMAL HIGH (ref 11.1–15.9)
MCH: 27.6 pg (ref 26.6–33.0)
MCHC: 33.8 g/dL (ref 31.5–35.7)
MCV: 82 fL (ref 79–97)
Platelets: 314 10*3/uL (ref 150–379)
RBC: 5.87 x10E6/uL — ABNORMAL HIGH (ref 3.77–5.28)
RDW: 15 % (ref 12.3–15.4)
WBC: 7.4 10*3/uL (ref 3.4–10.8)

## 2015-03-03 LAB — TSH: TSH: 1.31 u[IU]/mL (ref 0.450–4.500)

## 2015-03-03 MED ORDER — SIMVASTATIN 20 MG PO TABS: 20.0000 mg | ORAL_TABLET | Freq: Every day | ORAL | Status: DC

## 2015-03-03 NOTE — Telephone Encounter (Signed)
Pt aware of labs, needs to increase water and watch fats, will rx zocor 20 mg 1 daily and recheck labs in 3 months, give blood often

## 2015-03-03 NOTE — Telephone Encounter (Signed)
Left message to call about labs 

## 2015-03-11 ENCOUNTER — Telehealth: Payer: Self-pay | Admitting: Adult Health

## 2015-03-11 NOTE — Telephone Encounter (Signed)
She just wanted me to know she has lost 6 lbs since last week when i called and told her cholesterol was up

## 2015-03-12 ENCOUNTER — Other Ambulatory Visit: Payer: Self-pay | Admitting: Adult Health

## 2015-03-12 MED ORDER — SIMVASTATIN 20 MG PO TABS: 20.0000 mg | ORAL_TABLET | Freq: Every day | ORAL | Status: DC

## 2015-06-12 ENCOUNTER — Telehealth: Payer: Self-pay | Admitting: *Deleted

## 2015-06-12 DIAGNOSIS — E7800 Pure hypercholesterolemia, unspecified: Secondary | ICD-10-CM

## 2015-06-15 NOTE — Telephone Encounter (Signed)
Will send orders

## 2015-06-18 ENCOUNTER — Other Ambulatory Visit: Payer: Self-pay | Admitting: Adult Health

## 2015-06-19 ENCOUNTER — Telehealth: Payer: Self-pay | Admitting: Adult Health

## 2015-06-19 LAB — COMPREHENSIVE METABOLIC PANEL
ALBUMIN: 4.2 g/dL (ref 3.6–4.8)
ALT: 20 IU/L (ref 0–32)
AST: 24 IU/L (ref 0–40)
Albumin/Globulin Ratio: 1.6 (ref 1.1–2.5)
Alkaline Phosphatase: 133 IU/L — ABNORMAL HIGH (ref 39–117)
BUN / CREAT RATIO: 14 (ref 11–26)
BUN: 12 mg/dL (ref 8–27)
Bilirubin Total: 0.3 mg/dL (ref 0.0–1.2)
CALCIUM: 9.2 mg/dL (ref 8.7–10.3)
CO2: 25 mmol/L (ref 18–29)
CREATININE: 0.88 mg/dL (ref 0.57–1.00)
Chloride: 105 mmol/L (ref 97–106)
GFR, EST AFRICAN AMERICAN: 81 mL/min/{1.73_m2} (ref >59)
GFR, EST NON AFRICAN AMERICAN: 70 mL/min/{1.73_m2} (ref >59)
GLUCOSE: 90 mg/dL (ref 65–99)
Globulin, Total: 2.7 g/dL (ref 1.5–4.5)
Potassium: 4.9 mmol/L (ref 3.5–5.2)
Sodium: 142 mmol/L (ref 136–144)
TOTAL PROTEIN: 6.9 g/dL (ref 6.0–8.5)

## 2015-06-19 LAB — LIPID PANEL
CHOL/HDL RATIO: 2.8 ratio (ref 0.0–4.4)
Cholesterol, Total: 166 mg/dL (ref 100–199)
HDL: 60 mg/dL (ref >39)
LDL CALC: 87 mg/dL (ref 0–99)
Triglycerides: 97 mg/dL (ref 0–149)
VLDL CHOLESTEROL CAL: 19 mg/dL (ref 5–40)

## 2015-06-19 LAB — CBC
HEMOGLOBIN: 14.1 g/dL (ref 11.1–15.9)
Hematocrit: 43.8 % (ref 34.0–46.6)
MCH: 26.3 pg — AB (ref 26.6–33.0)
MCHC: 32.2 g/dL (ref 31.5–35.7)
MCV: 82 fL (ref 79–97)
PLATELETS: 284 10*3/uL (ref 150–379)
RBC: 5.36 x10E6/uL — AB (ref 3.77–5.28)
RDW: 13.3 % (ref 12.3–15.4)
WBC: 3.9 10*3/uL (ref 3.4–10.8)

## 2015-06-19 LAB — TSH: TSH: 1.22 u[IU]/mL (ref 0.450–4.500)

## 2015-06-19 NOTE — Telephone Encounter (Signed)
Pt aware of CMP lipids not back yet

## 2015-06-22 ENCOUNTER — Telehealth: Payer: Self-pay | Admitting: Adult Health

## 2015-06-22 NOTE — Telephone Encounter (Signed)
Pt aware labs  Were much better, keep taking meds and watching diet

## 2015-08-18 ENCOUNTER — Ambulatory Visit: Payer: 59 | Admitting: Urology

## 2015-08-25 MED FILL — CLIMARA PRO PATCH: 0.045-0.015 | 28 days supply | Qty: 4 | Fill #6

## 2015-09-07 ENCOUNTER — Encounter (INDEPENDENT_AMBULATORY_CARE_PROVIDER_SITE_OTHER): Payer: Self-pay | Admitting: *Deleted

## 2015-09-07 ENCOUNTER — Telehealth: Payer: Self-pay | Admitting: Adult Health

## 2015-09-07 DIAGNOSIS — N950 Postmenopausal bleeding: Secondary | ICD-10-CM

## 2015-09-07 NOTE — Telephone Encounter (Signed)
Pt off patch and having some spotting, PMB,needs [place] to assess uterine lining will get [place] at [facility], where she works

## 2015-09-10 ENCOUNTER — Telehealth: Payer: Self-pay | Admitting: *Deleted

## 2015-09-10 ENCOUNTER — Ambulatory Visit (HOSPITAL_COMMUNITY)
Admission: RE | Admit: 2015-09-10 | Discharge: 2015-09-10 | Disposition: A | Discharge status: Home / Self Care | Payer: 59 | Source: Ambulatory Visit | Attending: Adult Health | Admitting: Adult Health

## 2015-09-10 DIAGNOSIS — N950 Postmenopausal bleeding: Secondary | ICD-10-CM | POA: Diagnosis not present

## 2015-09-10 DIAGNOSIS — R938 Abnormal findings on diagnostic imaging of other specified body structures: Secondary | ICD-10-CM | POA: Diagnosis not present

## 2015-09-10 MED FILL — PANTOPRAZOLE SOD DR 40 MG T: 40 | 90 days supply | Qty: 90 | Fill #2

## 2015-09-10 NOTE — Telephone Encounter (Signed)
Pt aware of [place] results, stay on patch

## 2015-09-11 ENCOUNTER — Ambulatory Visit (HOSPITAL_COMMUNITY): Payer: 59

## 2015-09-11 ENCOUNTER — Ambulatory Visit (HOSPITAL_COMMUNITY): Admission: RE | Admit: 2015-09-11 | Payer: 59 | Source: Ambulatory Visit

## 2015-09-23 MED FILL — NAPROXEN 500 MG TABLET: 500 | 60 days supply | Qty: 60 | Fill #2

## 2015-09-29 MED FILL — SIMVASTATIN 20 MG TABLET: 20 | 90 days supply | Qty: 90 | Fill #1

## 2015-09-30 ENCOUNTER — Other Ambulatory Visit: Payer: Self-pay | Admitting: Adult Health

## 2015-09-30 DIAGNOSIS — Z1231 Encounter for screening mammogram for malignant neoplasm of breast: Secondary | ICD-10-CM

## 2015-10-01 ENCOUNTER — Ambulatory Visit (HOSPITAL_COMMUNITY)
Admission: RE | Admit: 2015-10-01 | Discharge: 2015-10-01 | Disposition: A | Discharge status: Home / Self Care | Payer: 59 | Source: Ambulatory Visit | Attending: Adult Health | Admitting: Adult Health

## 2015-10-01 DIAGNOSIS — Z1231 Encounter for screening mammogram for malignant neoplasm of breast: Secondary | ICD-10-CM | POA: Diagnosis not present

## 2015-10-06 ENCOUNTER — Other Ambulatory Visit: Payer: Self-pay | Admitting: *Deleted

## 2015-10-06 MED ORDER — CLIMARA PRO 0.045-0.015 MG/DAY TD PTWK: MEDICATED_PATCH | TRANSDERMAL | Status: DC

## 2015-10-06 MED FILL — CLIMARA PRO PATCH: 0.045-0.015 | 28 days supply | Qty: 4 | Fill #0

## 2015-10-29 DIAGNOSIS — M79672 Pain in left foot: Secondary | ICD-10-CM | POA: Diagnosis not present

## 2015-10-29 DIAGNOSIS — M79671 Pain in right foot: Secondary | ICD-10-CM | POA: Diagnosis not present

## 2015-10-29 DIAGNOSIS — B351 Tinea unguium: Secondary | ICD-10-CM | POA: Diagnosis not present

## 2015-11-17 MED FILL — CLIMARA PRO PATCH: 0.045-0.015 | 28 days supply | Qty: 4 | Fill #1

## 2015-11-27 MED FILL — AMMONIUM LACTATE 12% LOTION: 12 | 20 days supply | Qty: 226 | Fill #0

## 2015-12-17 MED FILL — CLIMARA PRO PATCH: 0.045-0.015 | 28 days supply | Qty: 4 | Fill #2

## 2015-12-23 DIAGNOSIS — H5203 Hypermetropia, bilateral: Secondary | ICD-10-CM | POA: Diagnosis not present

## 2015-12-23 DIAGNOSIS — H52223 Regular astigmatism, bilateral: Secondary | ICD-10-CM | POA: Diagnosis not present

## 2015-12-23 DIAGNOSIS — H524 Presbyopia: Secondary | ICD-10-CM | POA: Diagnosis not present

## 2015-12-23 DIAGNOSIS — H43812 Vitreous degeneration, left eye: Secondary | ICD-10-CM | POA: Diagnosis not present

## 2016-01-29 ENCOUNTER — Other Ambulatory Visit: Payer: Self-pay | Admitting: Orthopedic Surgery

## 2016-02-02 MED FILL — CLIMARA PRO PATCH: 0.045-0.015 | 28 days supply | Qty: 4 | Fill #3

## 2016-02-04 ENCOUNTER — Other Ambulatory Visit: Payer: Self-pay | Admitting: *Deleted

## 2016-02-04 ENCOUNTER — Telehealth: Payer: Self-pay | Admitting: Orthopedic Surgery

## 2016-02-04 MED ORDER — NAPROXEN 500 MG PO TABS: 500.0000 mg | ORAL_TABLET | Freq: Every day | ORAL | Status: DC | PRN

## 2016-02-04 NOTE — Telephone Encounter (Signed)
Courtney Bradley from the [facility] called and left message on voicemail asking if Dr.Harrison approved the five refills on the Naprosyn script or just approved the one time.  Her direct line is [phone]  Please advise

## 2016-02-04 NOTE — Telephone Encounter (Signed)
SEND ELECTRONICALLY

## 2016-02-09 MED FILL — NAPROXEN 500 MG TABLET: 500 | 60 days supply | Qty: 60 | Fill #0

## 2016-02-26 ENCOUNTER — Other Ambulatory Visit: Payer: Self-pay | Admitting: Adult Health

## 2016-02-29 MED FILL — PANTOPRAZOLE SOD DR 40 MG T: 40 | 90 days supply | Qty: 90 | Fill #0

## 2016-03-18 MED FILL — CLIMARA PRO PATCH: 0.045-0.015 | 28 days supply | Qty: 4 | Fill #4

## 2016-03-23 ENCOUNTER — Other Ambulatory Visit: Payer: Self-pay | Admitting: Adult Health

## 2016-03-23 MED FILL — SIMVASTATIN 20 MG TABLET: 20 | 90 days supply | Qty: 90 | Fill #0

## 2016-04-20 MED FILL — CLIMARA PRO PATCH: 0.045-0.015 | 28 days supply | Qty: 4 | Fill #5

## 2016-05-25 MED FILL — CLIMARA PRO PATCH: 0.045-0.015 | 28 days supply | Qty: 4 | Fill #6

## 2016-06-24 ENCOUNTER — Telehealth: Payer: Self-pay | Admitting: Adult Health

## 2016-06-27 ENCOUNTER — Other Ambulatory Visit: Payer: Self-pay | Admitting: Adult Health

## 2016-06-27 DIAGNOSIS — E7800 Pure hypercholesterolemia, unspecified: Secondary | ICD-10-CM

## 2016-06-27 DIAGNOSIS — Z01419 Encounter for gynecological examination (general) (routine) without abnormal findings: Secondary | ICD-10-CM

## 2016-06-27 NOTE — Telephone Encounter (Signed)
Left message can order labs however she wants

## 2016-06-28 ENCOUNTER — Other Ambulatory Visit: Payer: Self-pay | Admitting: Adult Health

## 2016-06-28 MED FILL — CLIMARA PRO PATCH: 0.045-0.015 | 28 days supply | Qty: 4 | Fill #0

## 2016-07-06 MED FILL — NAPROXEN 500 MG TABLET: 500 | 60 days supply | Qty: 60 | Fill #1

## 2016-07-08 ENCOUNTER — Other Ambulatory Visit: Payer: 59 | Admitting: Adult Health

## 2016-07-08 DIAGNOSIS — Z01419 Encounter for gynecological examination (general) (routine) without abnormal findings: Secondary | ICD-10-CM | POA: Diagnosis not present

## 2016-07-09 LAB — COMPREHENSIVE METABOLIC PANEL
ALK PHOS: 137 IU/L — AB (ref 39–117)
ALT: 20 IU/L (ref 0–32)
AST: 22 IU/L (ref 0–40)
Albumin/Globulin Ratio: 1.4 (ref 1.2–2.2)
Albumin: 4.2 g/dL (ref 3.6–4.8)
BILIRUBIN TOTAL: 0.6 mg/dL (ref 0.0–1.2)
BUN/Creatinine Ratio: 16 (ref 12–28)
BUN: 15 mg/dL (ref 8–27)
CHLORIDE: 105 mmol/L (ref 96–106)
CO2: 24 mmol/L (ref 18–29)
Calcium: 9.9 mg/dL (ref 8.7–10.3)
Creatinine, Ser: 0.93 mg/dL (ref 0.57–1.00)
GFR calc Af Amer: 75 mL/min/{1.73_m2} (ref >59)
GFR calc non Af Amer: 65 mL/min/{1.73_m2} (ref >59)
GLUCOSE: 98 mg/dL (ref 65–99)
Globulin, Total: 2.9 g/dL (ref 1.5–4.5)
Potassium: 5.6 mmol/L — ABNORMAL HIGH (ref 3.5–5.2)
Sodium: 144 mmol/L (ref 134–144)
Total Protein: 7.1 g/dL (ref 6.0–8.5)

## 2016-07-09 LAB — LIPID PANEL
CHOL/HDL RATIO: 3.5 ratio (ref 0.0–4.4)
Cholesterol, Total: 222 mg/dL — ABNORMAL HIGH (ref 100–199)
HDL: 63 mg/dL (ref >39)
LDL CALC: 140 mg/dL — AB (ref 0–99)
TRIGLYCERIDES: 97 mg/dL (ref 0–149)
VLDL Cholesterol Cal: 19 mg/dL (ref 5–40)

## 2016-07-09 LAB — VITAMIN D 25 HYDROXY (VIT D DEFICIENCY, FRACTURES): Vit D, 25-Hydroxy: 60.9 ng/mL (ref 30.0–100.0)

## 2016-07-09 LAB — CBC
HEMATOCRIT: 47.8 % — AB (ref 34.0–46.6)
HEMOGLOBIN: 15.8 g/dL (ref 11.1–15.9)
MCH: 28.1 pg (ref 26.6–33.0)
MCHC: 33.1 g/dL (ref 31.5–35.7)
MCV: 85 fL (ref 79–97)
Platelets: 296 10*3/uL (ref 150–379)
RBC: 5.63 x10E6/uL — ABNORMAL HIGH (ref 3.77–5.28)
RDW: 13.4 % (ref 12.3–15.4)
WBC: 5.5 10*3/uL (ref 3.4–10.8)

## 2016-07-09 LAB — TSH: TSH: 1.21 u[IU]/mL (ref 0.450–4.500)

## 2016-07-09 LAB — HEMOGLOBIN A1C
Est. average glucose Bld gHb Est-mCnc: 108 mg/dL
Hgb A1c MFr Bld: 5.4 % (ref 4.8–5.6)

## 2016-07-22 ENCOUNTER — Encounter: Payer: Self-pay | Admitting: Adult Health

## 2016-07-22 ENCOUNTER — Ambulatory Visit (INDEPENDENT_AMBULATORY_CARE_PROVIDER_SITE_OTHER): Payer: 59 | Admitting: Adult Health

## 2016-07-22 VITALS — BP 136/90 | HR 76 | Ht 66.0 in | Wt 230.0 lb

## 2016-07-22 DIAGNOSIS — Z79890 Hormone replacement therapy (postmenopausal): Secondary | ICD-10-CM

## 2016-07-22 DIAGNOSIS — E7800 Pure hypercholesterolemia, unspecified: Secondary | ICD-10-CM

## 2016-07-22 DIAGNOSIS — Z1211 Encounter for screening for malignant neoplasm of colon: Secondary | ICD-10-CM | POA: Diagnosis not present

## 2016-07-22 DIAGNOSIS — Z713 Dietary counseling and surveillance: Secondary | ICD-10-CM

## 2016-07-22 DIAGNOSIS — R635 Abnormal weight gain: Secondary | ICD-10-CM

## 2016-07-22 DIAGNOSIS — Z6837 Body mass index (BMI) 37.0-37.9, adult: Secondary | ICD-10-CM

## 2016-07-22 DIAGNOSIS — Z01411 Encounter for gynecological examination (general) (routine) with abnormal findings: Secondary | ICD-10-CM

## 2016-07-22 DIAGNOSIS — Z01419 Encounter for gynecological examination (general) (routine) without abnormal findings: Secondary | ICD-10-CM

## 2016-07-22 LAB — HEMOCCULT GUIAC POC 1CARD (OFFICE): Fecal Occult Blood, POC: NEGATIVE

## 2016-07-22 MED ORDER — PHENTERMINE HCL 37.5 MG PO CAPS: 37.5000 mg | ORAL_CAPSULE | ORAL | 0 refills | Status: DC

## 2016-07-22 NOTE — Patient Instructions (Signed)
Physical in 1 year Pap in 2019 Mammogram yearly Decrease carbs  Follow up in 4 weeks

## 2016-07-22 NOTE — Progress Notes (Signed)
Patient ID: Courtney Bradley, female   DOB: 21-Sep-1950, 65 y.o.   MRN: [id number] History of Present Illness: Courtney Bradley is a 65 year year old white female, married in for well woman gyn exam, she had normal pap with negative HPV,07/21/15.She complains of weight gain, she is active, walks 4 miles most days. Had labs recently and cholesterol up but hopes diet will correct that. She is still on climara pro patches.   Current Medications, Allergies, Past Medical History, Past Surgical History, Family History and Social History were reviewed in [employer] record.     Review of Systems: Patient denies any headaches, hearing loss, fatigue, blurred vision, shortness of breath, chest pain, abdominal pain, problems with bowel movements, urination, or intercourse. No joint pain or mood swings.+weight gain    Physical Exam:BP 136/90 (BP Location: Left Arm, Patient Position: Sitting, Cuff Size: Large)   Pulse 76   Ht 5\' 6"  (1.676 m)   Wt 230 lb (104.3 kg)   BMI 37.12 kg/m  General:  Well developed, well nourished, no acute distress Skin:  Warm and dry Neck:  Midline trachea, normal thyroid, good ROM, no lymphadenopathy,no carotid bruits Lungs; Clear to auscultation bilaterally Breast:  No dominant palpable mass, retraction, or nipple discharge Cardiovascular: Regular rate and rhythm Abdomen:  Soft, non tender, no hepatosplenomegaly Pelvic:  External genitalia is normal in appearance, no lesions.  The vagina is normal in appearance. Urethra has no lesions or masses. The cervix is bulbous.  Uterus is felt to be normal size, shape, and contour.  No adnexal masses or tenderness noted.Bladder is non tender, no masses felt. Rectal: Good sphincter tone, no polyps, or hemorrhoids felt.  Hemoccult negative. Extremities/musculoskeletal:  No swelling or varicosities noted, no clubbing or cyanosis Psych:  No mood changes, alert and cooperative,seems happy PHQ 2 score 0.  Impression: 1.  Well woman exam with routine gynecological exam   2. Hormone replacement therapy (HRT)   3. Weight gain   4. Elevated cholesterol   5. Body mass index 37.0-37.9, adult   6. Weight loss counseling, encounter for       Plan: Rx phentermine 37.5 mg #30 take 1 daily  Follow up in 4 weeks Physical in 1 year Pap in 2019 Mammogram yearly Decrease carbs  Continue patch,has refills

## 2016-07-27 MED FILL — CLIMARA PRO PATCH: 0.045-0.015 | 28 days supply | Qty: 4 | Fill #1

## 2016-08-11 ENCOUNTER — Other Ambulatory Visit (INDEPENDENT_AMBULATORY_CARE_PROVIDER_SITE_OTHER): Payer: Self-pay | Admitting: *Deleted

## 2016-08-11 DIAGNOSIS — Z1211 Encounter for screening for malignant neoplasm of colon: Secondary | ICD-10-CM | POA: Insufficient documentation

## 2016-08-19 ENCOUNTER — Ambulatory Visit: Payer: 59 | Admitting: Adult Health

## 2016-09-06 ENCOUNTER — Telehealth: Payer: Self-pay | Admitting: Adult Health

## 2016-09-06 ENCOUNTER — Other Ambulatory Visit: Payer: Self-pay | Admitting: Adult Health

## 2016-09-06 MED ORDER — ESTRADIOL-NORETHINDRONE ACET 0.05-0.14 MG/DAY TD PTTW: 1.0000 | MEDICATED_PATCH | TRANSDERMAL | 12 refills | Status: DC

## 2016-09-06 MED FILL — PANTOPRAZOLE SOD DR 40 MG T: 40 | 90 days supply | Qty: 90 | Fill #1

## 2016-09-06 MED FILL — COMBIPATCH 0.05-0.14 MG PTC: 0.05-0.14 | 28 days supply | Qty: 8 | Fill #0

## 2016-09-06 NOTE — Telephone Encounter (Signed)
Pt called climara pro patches $100 now, wants something different, will talk with pharmacy at [facility] for cheaper alternative.

## 2016-09-06 NOTE — Progress Notes (Signed)
Will try combipatch, climara pro too expensive

## 2016-09-23 ENCOUNTER — Ambulatory Visit (HOSPITAL_COMMUNITY): Admission: RE | Admit: 2016-09-23 | Payer: 59 | Source: Ambulatory Visit

## 2016-09-23 ENCOUNTER — Other Ambulatory Visit: Payer: Self-pay | Admitting: Adult Health

## 2016-09-23 DIAGNOSIS — Z1231 Encounter for screening mammogram for malignant neoplasm of breast: Secondary | ICD-10-CM

## 2016-09-26 ENCOUNTER — Telehealth (INDEPENDENT_AMBULATORY_CARE_PROVIDER_SITE_OTHER): Payer: Self-pay | Admitting: *Deleted

## 2016-09-26 ENCOUNTER — Encounter (INDEPENDENT_AMBULATORY_CARE_PROVIDER_SITE_OTHER): Payer: Self-pay | Admitting: *Deleted

## 2016-09-26 ENCOUNTER — Other Ambulatory Visit: Payer: Self-pay | Admitting: Adult Health

## 2016-09-26 ENCOUNTER — Ambulatory Visit (HOSPITAL_COMMUNITY)
Admission: RE | Admit: 2016-09-26 | Discharge: 2016-09-26 | Disposition: A | Discharge status: Home / Self Care | Payer: 59 | Source: Ambulatory Visit | Attending: Adult Health | Admitting: Adult Health

## 2016-09-26 DIAGNOSIS — Z1231 Encounter for screening mammogram for malignant neoplasm of breast: Secondary | ICD-10-CM | POA: Insufficient documentation

## 2016-09-26 NOTE — Telephone Encounter (Signed)
Patient needs trilyte 

## 2016-09-27 MED ORDER — PEG 3350-KCL-NA BICARB-NACL 420 G PO SOLR: 4000.0000 mL | Freq: Once | ORAL | 0 refills | Status: AC

## 2016-09-27 MED FILL — GAVILYTE-N SOLUTION: 420 | 1 days supply | Qty: 4000 | Fill #0

## 2016-09-30 ENCOUNTER — Ambulatory Visit: Payer: 59 | Admitting: Adult Health

## 2016-10-11 ENCOUNTER — Telehealth: Payer: Self-pay | Admitting: Adult Health

## 2016-10-11 MED ORDER — ESTRADIOL-LEVONORGESTREL 0.045-0.015 MG/DAY TD PTWK: 1.0000 | MEDICATED_PATCH | TRANSDERMAL | 12 refills | Status: DC

## 2016-10-11 NOTE — Telephone Encounter (Signed)
Pt found coupon for climara pro and wants to go back on them, if coupon works, felt better on it then on combipatch.

## 2016-10-13 MED FILL — SERTRALINE HCL 50 MG TABLET: 50 | 90 days supply | Qty: 90 | Fill #0

## 2016-10-17 MED FILL — CLIMARA PRO PATCH: 0.045-0.015 | 28 days supply | Qty: 4 | Fill #0

## 2016-10-19 ENCOUNTER — Telehealth (INDEPENDENT_AMBULATORY_CARE_PROVIDER_SITE_OTHER): Payer: Self-pay | Admitting: *Deleted

## 2016-10-19 NOTE — Telephone Encounter (Signed)
agree

## 2016-10-19 NOTE — Telephone Encounter (Signed)
Referring MD/PCP: hawkins   Procedure: tcs  Reason/Indication:  screening  Has patient had this procedure before?  Yes, 10 yrs ago  If so, when, by whom and where?    Is there a family history of colon cancer?  no  Who?  What age when diagnosed?    Is patient diabetic?   no      Does patient have prosthetic heart valve or mechanical valve?  no  Do you have a pacemaker?  no  Has patient ever had endocarditis? no  Has patient had joint replacement within last 12 months?  no  Does patient tend to be constipated or take laxatives? no  Does patient have a history of alcohol/drug use?  no  Is patient on Coumadin, Plavix and/or Aspirin? no  Medications: naproxen prn, pantoprazole prn, clarmira hormone patch daily, zoloft prn, allergy pill, calcium, vit d  Allergies: pcn  Medication Adjustment per Dr Laural Golden:   Procedure date & time: 11/17/16 at 830

## 2016-11-01 MED FILL — SIMVASTATIN 20 MG TABLET: 20 | 90 days supply | Qty: 90 | Fill #1

## 2016-11-07 ENCOUNTER — Telehealth: Payer: Self-pay | Admitting: Adult Health

## 2016-11-07 DIAGNOSIS — N950 Postmenopausal bleeding: Secondary | ICD-10-CM

## 2016-11-07 NOTE — Telephone Encounter (Signed)
Has had PMB on HRT,  has stopped bleeding but will still get [place] to assess.[place] scheduled for [facility] 3/22 at 3:30 pm

## 2016-11-10 ENCOUNTER — Ambulatory Visit (HOSPITAL_COMMUNITY): Payer: 59

## 2016-11-11 ENCOUNTER — Ambulatory Visit: Payer: 59 | Admitting: Adult Health

## 2016-11-17 ENCOUNTER — Ambulatory Visit (HOSPITAL_COMMUNITY)
Admission: RE | Admit: 2016-11-17 | Discharge: 2016-11-17 | Disposition: A | Discharge status: Home / Self Care | Payer: 59 | Source: Ambulatory Visit | Attending: Internal Medicine | Admitting: Internal Medicine

## 2016-11-17 ENCOUNTER — Encounter (HOSPITAL_COMMUNITY): Payer: Self-pay | Admitting: *Deleted

## 2016-11-17 ENCOUNTER — Encounter (HOSPITAL_COMMUNITY)
Admission: RE | Disposition: A | Discharge status: Home / Self Care | Payer: Self-pay | Source: Ambulatory Visit | Attending: Internal Medicine

## 2016-11-17 DIAGNOSIS — E669 Obesity, unspecified: Secondary | ICD-10-CM | POA: Diagnosis not present

## 2016-11-17 DIAGNOSIS — K5730 Diverticulosis of large intestine without perforation or abscess without bleeding: Secondary | ICD-10-CM | POA: Insufficient documentation

## 2016-11-17 DIAGNOSIS — F418 Other specified anxiety disorders: Secondary | ICD-10-CM | POA: Insufficient documentation

## 2016-11-17 DIAGNOSIS — Z79899 Other long term (current) drug therapy: Secondary | ICD-10-CM | POA: Diagnosis not present

## 2016-11-17 DIAGNOSIS — K219 Gastro-esophageal reflux disease without esophagitis: Secondary | ICD-10-CM | POA: Diagnosis not present

## 2016-11-17 DIAGNOSIS — Z880 Allergy status to penicillin: Secondary | ICD-10-CM | POA: Diagnosis not present

## 2016-11-17 DIAGNOSIS — Q438 Other specified congenital malformations of intestine: Secondary | ICD-10-CM | POA: Insufficient documentation

## 2016-11-17 DIAGNOSIS — Z1211 Encounter for screening for malignant neoplasm of colon: Secondary | ICD-10-CM | POA: Diagnosis not present

## 2016-11-17 DIAGNOSIS — M8580 Other specified disorders of bone density and structure, unspecified site: Secondary | ICD-10-CM | POA: Diagnosis not present

## 2016-11-17 DIAGNOSIS — K644 Residual hemorrhoidal skin tags: Secondary | ICD-10-CM | POA: Diagnosis not present

## 2016-11-17 DIAGNOSIS — Z6836 Body mass index (BMI) 36.0-36.9, adult: Secondary | ICD-10-CM | POA: Diagnosis not present

## 2016-11-17 HISTORY — PX: COLONOSCOPY: SHX5424

## 2016-11-17 SURGERY — COLONOSCOPY
Anesthesia: Moderate Sedation

## 2016-11-17 MED ORDER — SODIUM CHLORIDE 0.9 % IV SOLN
INTRAVENOUS | Status: DC
  Administered 2016-11-17: 09:00:00 via INTRAVENOUS

## 2016-11-17 MED ORDER — MEPERIDINE HCL 50 MG/ML IJ SOLN
INTRAMUSCULAR | Status: AC
  Filled 2016-11-17: qty 1

## 2016-11-17 MED ORDER — MEPERIDINE HCL 50 MG/ML IJ SOLN
INTRAMUSCULAR | Status: DC | PRN
  Administered 2016-11-17 (×2): 25 mg via INTRAVENOUS

## 2016-11-17 MED ORDER — MIDAZOLAM HCL 5 MG/5ML IJ SOLN
INTRAMUSCULAR | Status: DC | PRN
  Administered 2016-11-17 (×2): 2 mg via INTRAVENOUS
  Administered 2016-11-17 (×2): 1 mg via INTRAVENOUS
  Administered 2016-11-17: 2 mg via INTRAVENOUS

## 2016-11-17 MED ORDER — MIDAZOLAM HCL 5 MG/5ML IJ SOLN
INTRAMUSCULAR | Status: AC
  Filled 2016-11-17: qty 10

## 2016-11-17 MED ORDER — STERILE WATER FOR IRRIGATION IR SOLN
Status: DC | PRN
  Administered 2016-11-17: 10:00:00

## 2016-11-17 NOTE — H&P (Signed)
Courtney Bradley is an 66 y.o. female.   Chief Complaint: Patient is here for colonoscopy. HPI: 66 year old Caucasian female who is in for screening colonoscopy. Last exam was in January 2007. Mild sigmoid colon diverticulosis. She denies abdominal pain change in bowel habits or rectal bleeding. Family history is negative for CRC.  Past Medical History:  Diagnosis Date           kidney stones  . Depression   . Depression with anxiety   . GERD (gastroesophageal reflux disease)   . Hormone replacement therapy (HRT) 09/19/2014  . Obesity   . Osteopenia     Past Surgical History:  Procedure Laterality Date  . CHOLECYSTECTOMY    . CORRECTION HAMMER TOE Bilateral    2nd  . GALLBLADDER SURGERY    . LITHOTRIPSY    . neck lift      Family History  Problem Relation Age of Onset  . Heart disease Mother   . Cancer Mother   . Diabetes Mother   . Coronary artery disease Mother   . Alzheimer's disease Father   . Cancer Father     prostate  . Cancer Maternal Aunt     breast  . Cancer Paternal Aunt     breast  . Cancer Maternal Aunt     breast  . Colon cancer Neg Hx    Social History:  reports that she has never smoked. She has never used smokeless tobacco. She reports that she drinks alcohol. She reports that she does not use drugs.  Allergies:  Allergies  Allergen Reactions  . Penicillins Swelling    Medications Prior to Admission  Medication Sig Dispense Refill  . Calcium Carbonate (CALCIUM 600 PO) Take 1 tablet by mouth daily.     Marland Kitchen estradiol-levonorgestrel (CLIMARA PRO) 0.045-0.015 MG/DAY Place 1 patch onto the skin once a week. 4 patch 12  . levocetirizine (XYZAL) 5 MG tablet Take 5 mg by mouth daily as needed for allergies.    . Multiple Vitamins-Minerals (MULTIVITAMIN WITH MINERALS) tablet Take 1 tablet by mouth daily.    . naproxen (NAPROSYN) 500 MG tablet Take 1 tablet (500 mg total) by mouth daily as needed (back pain). 60 tablet 4  . pantoprazole (PROTONIX) 40  MG tablet TAKE 1 TABLET BY MOUTH ONCE DAILY (Patient taking differently: TAKE 1 TABLET BY MOUTH ONCE DAILY PRN) 90 tablet PRN  . sertraline (ZOLOFT) 50 MG tablet Take 1 tablet by mouth daily.  3  . simvastatin (ZOCOR) 20 MG tablet Take 1 tablet by mouth every evening.  11  . phentermine 37.5 MG capsule Take 1 capsule (37.5 mg total) by mouth every morning. (Patient not taking: Reported on 11/16/2016) 30 capsule 0    No results found for this or any previous visit (from the past 48 hour(s)). No results found.  ROS  Blood pressure (!) 142/87, pulse 77, temperature 98.4 F (36.9 C), temperature source Oral, resp. rate 13, height 5\' 7"  (1.702 m), weight 230 lb (104.3 kg), SpO2 99 %. Physical Exam  Constitutional: She appears well-developed and well-nourished.  HENT:  Mouth/Throat: Oropharynx is clear and moist.  Eyes: Conjunctivae are normal. No scleral icterus.  Neck: No thyromegaly present.  Cardiovascular: Normal rate and regular rhythm.   No murmur heard. Respiratory: Effort normal and breath sounds normal.  GI: Soft. She exhibits no distension and no mass. There is no tenderness.  Musculoskeletal: She exhibits no edema.  Lymphadenopathy:    She has no cervical adenopathy.  Neurological:  She is alert.  Skin: Skin is warm and dry.     Assessment/Plan Average risk screening colonoscopy.  Hildred Laser, MD 11/17/2016, 10:14 AM

## 2016-11-17 NOTE — Op Note (Signed)
[facility] Patient Name: Courtney Bradley Procedure Date: 11/17/2016 10:00 AM MRN: [id number] Date of Birth: 1950/10/21 Attending MD: Hildred Laser , MD CSN: [id number] Age: 66 Admit Type: Outpatient Procedure:                Colonoscopy Indications:              Screening for colorectal malignant neoplasm Providers:                Hildred Laser, MD, Otis Peak B. Sharon Seller, RN, Aram Candela Referring MD:             Jasper Loser. Luan Pulling, MD Medicines:                Meperidine 50 mg IV, Midazolam 8 mg IV Complications:            No immediate complications. Estimated Blood Loss:     Estimated blood loss: none. Procedure:                Pre-Anesthesia Assessment:                           - Prior to the procedure, a History and Physical                            was performed, and patient medications and                            allergies were reviewed. The patient's tolerance of                            previous anesthesia was also reviewed. The risks                            and benefits of the procedure and the sedation                            options and risks were discussed with the patient.                            All questions were answered, and informed consent                            was obtained. Prior Anticoagulants: The patient                            last took naproxen 14 days prior to the procedure.                            ASA Grade Assessment: II - A patient with mild                            systemic disease. After reviewing the risks and  benefits, the patient was deemed in satisfactory                            condition to undergo the procedure.                           After obtaining informed consent, the colonoscope                            was passed under direct vision. Throughout the                            procedure, the patient's blood pressure, pulse, and        oxygen saturations were monitored continuously. The                            [id number] ([id number]) scope was introduced through                            the anus and advanced to the the cecum, identified                            by appendiceal orifice and ileocecal valve. The                            colonoscopy was somewhat difficult due to a                            tortuous colon. Successful completion of the                            procedure was aided by increasing the dose of                            sedation medication, changing the patient to a                            supine position and withdrawing and reinserting the                            scope. The patient tolerated the procedure well.                            The quality of the bowel preparation was good. The                            ileocecal valve, appendiceal orifice, and rectum                            were photographed. Scope In: 10:24:14 AM Scope Out: 10:55:47 AM Scope Withdrawal Time: 0 hours 10 minutes 34 seconds  Total Procedure Duration: 0 hours 31 minutes 33 seconds  Findings:      The perianal and digital rectal examinations were normal.  A single small-mouthed diverticulum was found in the hepatic flexure.      A few small-mouthed diverticula were found in the sigmoid colon.      The exam was otherwise without abnormality.      External hemorrhoids were found during retroflexion. The hemorrhoids       were small. Impression:               - Diverticulosis at the hepatic flexure.                           - Diverticulosis in the sigmoid colon.                           - The examination was otherwise normal.                           - External hemorrhoids.                           - No specimens collected. Moderate Sedation:      Moderate (conscious) sedation was administered by the endoscopy nurse       and supervised by the endoscopist. The following parameters were        monitored: oxygen saturation, heart rate, blood pressure, CO2       capnography and response to care. Total physician intraservice time was       37 minutes. Recommendation:           - Patient has a contact number available for                            emergencies. The signs and symptoms of potential                            delayed complications were discussed with the                            patient. Return to normal activities tomorrow.                            Written discharge instructions were provided to the                            patient.                           - High fiber diet today.                           - Continue present medications.                           - Repeat colonoscopy in 10 years for screening                            purposes. Procedure Code(s):        --- Professional ---  [phone], Colonoscopy, flexible; diagnostic, including                            collection of specimen(s) by brushing or washing,                            when performed (separate procedure)                           99152, Moderate sedation services provided by the                            same physician or other qualified health care                            professional performing the diagnostic or                            therapeutic service that the sedation supports,                            requiring the presence of an independent trained                            observer to assist in the monitoring of the                            patient's level of consciousness and physiological                            status; initial 15 minutes of intraservice time,                            patient age 41 years or older                           [phone], Moderate sedation services; each additional                            15 minutes intraservice time Diagnosis Code(s):        --- Professional ---                           Z12.11,  Encounter for screening for malignant                            neoplasm of colon                           K64.4, Residual hemorrhoidal skin tags                           K57.30, Diverticulosis of large intestine without                            perforation  or abscess without bleeding CPT copyright 2016 American Medical Association. All rights reserved. The codes documented in this report are preliminary and upon coder review may  be revised to meet current compliance requirements. Hildred Laser, MD Hildred Laser, MD 11/17/2016 11:05:25 AM This report has been signed electronically. Number of Addenda: 0

## 2016-11-17 NOTE — Discharge Instructions (Signed)
High-Fiber Diet Fiber, also called dietary fiber, is a type of carbohydrate found in fruits, vegetables, whole grains, and beans. A high-fiber diet can have many health benefits. Your health care provider may recommend a high-fiber diet to help:  Prevent constipation. Fiber can make your bowel movements more regular.  Lower your cholesterol.  Relieve hemorrhoids, uncomplicated diverticulosis, or irritable bowel syndrome.  Prevent overeating as part of a weight-loss plan.  Prevent heart disease, type 2 diabetes, and certain cancers. What is my plan? The recommended daily intake of fiber includes:  38 grams for men under age 16.  68 grams for men over age 78.  19 grams for women under age 43.  49 grams for women over age 68. You can get the recommended daily intake of dietary fiber by eating a variety of fruits, vegetables, grains, and beans. Your health care provider may also recommend a fiber supplement if it is not possible to get enough fiber through your diet. What do I need to know about a high-fiber diet?  Fiber supplements have not been widely studied for their effectiveness, so it is better to get fiber through food sources.  Always check the fiber content on thenutrition facts label of any prepackaged food. Look for foods that contain at least 5 grams of fiber per serving.  Ask your dietitian if you have questions about specific foods that are related to your condition, especially if those foods are not listed in the following section.  Increase your daily fiber consumption gradually. Increasing your intake of dietary fiber too quickly may cause bloating, cramping, or gas.  Drink plenty of water. Water helps you to digest fiber. What foods can I eat? Grains  Whole-grain breads. Multigrain cereal. Oats and oatmeal. Brown rice. Barley. Bulgur wheat. [place]. Bran muffins. Popcorn. Rye wafer crackers. Vegetables  Sweet potatoes. Spinach. Kale. Artichokes. Cabbage.  Broccoli. Green peas. Carrots. Squash. Fruits  Berries. Pears. Apples. Oranges. Avocados. Prunes and raisins. Dried figs. Meats and Other Protein Sources  [place]y, kidney, pinto, and soy beans. Split peas. Lentils. Nuts and seeds. Dairy  Fiber-fortified yogurt. Beverages  Fiber-fortified soy milk. Fiber-fortified orange juice. Other  Fiber bars. The items listed above may not be a complete list of recommended foods or beverages. Contact your dietitian for more options.  What foods are not recommended? Grains  White bread. Pasta made with refined flour. White rice. Vegetables  Fried potatoes. Canned vegetables. Well-cooked vegetables. Fruits  Fruit juice. Cooked, strained fruit. Meats and Other Protein Sources  Fatty cuts of meat. Fried Sales executive or fried fish. Dairy  Milk. Yogurt. Cream cheese. Sour cream. Beverages  Soft drinks. Other  Cakes and pastries. Butter and oils. The items listed above may not be a complete list of foods and beverages to avoid. Contact your dietitian for more information.  What are some tips for including high-fiber foods in my diet?  Eat a wide variety of high-fiber foods.  Make sure that half of all grains consumed each day are whole grains.  Replace breads and cereals made from refined flour or white flour with whole-grain breads and cereals.  Replace white rice with brown rice, bulgur wheat, or millet.  Start the day with a breakfast that is high in fiber, such as a cereal that contains at least 5 grams of fiber per serving.  Use beans in place of meat in soups, salads, or pasta.  Eat high-fiber snacks, such as berries, raw vegetables, nuts, or popcorn. This information is not  intended to replace advice given to you by your health care provider. Make sure you discuss any questions you have with your health care provider. Document Released: 08/08/2005 Document Revised: 01/14/2016 Document Reviewed: 01/21/2014 Elsevier Interactive Patient Education   2017 [employer].  Colonoscopy, Adult, Care After This sheet gives you information about how to care for yourself after your procedure. Your health care provider may also give you more specific instructions. If you have problems or questions, contact your health care provider. What can I expect after the procedure? After the procedure, it is common to have:  A small amount of blood in your stool for 24 hours after the procedure.  Some gas.  Mild abdominal cramping or bloating. Follow these instructions at home: General instructions    For the first 24 hours after the procedure:  Do not drive or use machinery.  Do not sign important documents.  Do not drink alcohol.  Do your regular daily activities at a slower pace than normal.  Eat soft, easy-to-digest foods.  Rest often.  Take over-the-counter or prescription medicines only as told by your health care provider.  It is up to you to get the results of your procedure. Ask your health care provider, or the department performing the procedure, when your results will be ready. Relieving cramping and bloating   Try walking around when you have cramps or feel bloated.  Apply heat to your abdomen as told by your health care provider. Use a heat source that your health care provider recommends, such as a moist heat pack or a heating pad.  Place a towel between your skin and the heat source.  Leave the heat on for 20-30 minutes.  Remove the heat if your skin turns bright red. This is especially important if you are unable to feel pain, heat, or cold. You may have a greater risk of getting burned. Eating and drinking   Drink enough fluid to keep your urine clear or pale yellow.  Resume your normal diet as instructed by your health care provider. Avoid heavy or fried foods that are hard to digest.  Avoid drinking alcohol for as long as instructed by your health care provider. Contact a health care provider if:  You have  blood in your stool 2-3 days after the procedure. Get help right away if:  You have more than a small spotting of blood in your stool.  You pass large blood clots in your stool.  Your abdomen is swollen.  You have nausea or vomiting.  You have a fever.  You have increasing abdominal pain that is not relieved with medicine. This information is not intended to replace advice given to you by your health care provider. Make sure you discuss any questions you have with your health care provider. Document Released: 03/22/2004 Document Revised: 05/02/2016 Document Reviewed: 10/20/2015 Elsevier Interactive Patient Education  2017 [place] usual medications and high fiber diet. No driving for 24 hours. Next screening exam in 10 years.

## 2016-11-18 ENCOUNTER — Other Ambulatory Visit (HOSPITAL_COMMUNITY): Payer: 59

## 2016-11-18 ENCOUNTER — Ambulatory Visit (HOSPITAL_COMMUNITY)
Admission: RE | Admit: 2016-11-18 | Discharge: 2016-11-18 | Disposition: A | Discharge status: Home / Self Care | Payer: 59 | Source: Ambulatory Visit | Attending: Adult Health | Admitting: Adult Health

## 2016-11-18 DIAGNOSIS — N950 Postmenopausal bleeding: Secondary | ICD-10-CM | POA: Diagnosis not present

## 2016-11-21 ENCOUNTER — Encounter (HOSPITAL_COMMUNITY): Payer: Self-pay | Admitting: Internal Medicine

## 2016-11-22 ENCOUNTER — Telehealth (INDEPENDENT_AMBULATORY_CARE_PROVIDER_SITE_OTHER): Payer: Self-pay | Admitting: *Deleted

## 2016-11-22 NOTE — Telephone Encounter (Signed)
Patient called and is concerned about her weight gain. She exercises and watches her weight but has reached a place that she is unable to lose. Talked with Dr.Rehman and he ask that we make a referral to [facility] for an evaluation for Gastric Bypass.

## 2016-11-23 NOTE — Telephone Encounter (Signed)
Spoke to [facility], they have begun the process but will need a letter of medical necessity as to how surgery will benefit patient -- once letter is done I can fax to [facility]

## 2016-11-25 MED FILL — CLIMARA PRO PATCH: 0.045-0.015 | 28 days supply | Qty: 4 | Fill #1

## 2016-11-28 ENCOUNTER — Encounter: Payer: Self-pay | Admitting: Adult Health

## 2016-11-28 ENCOUNTER — Telehealth: Payer: Self-pay | Admitting: Adult Health

## 2016-11-28 DIAGNOSIS — E7800 Pure hypercholesterolemia, unspecified: Secondary | ICD-10-CM

## 2016-11-28 HISTORY — DX: Pure hypercholesterolemia, unspecified: E78.00

## 2016-11-28 NOTE — Telephone Encounter (Signed)
Will send letter for weight loss surgery

## 2016-12-05 ENCOUNTER — Encounter (INDEPENDENT_AMBULATORY_CARE_PROVIDER_SITE_OTHER): Payer: Self-pay | Admitting: *Deleted

## 2016-12-06 NOTE — Telephone Encounter (Signed)
Has been addressed

## 2016-12-30 ENCOUNTER — Other Ambulatory Visit (HOSPITAL_COMMUNITY): Payer: Self-pay | Admitting: General Surgery

## 2016-12-30 DIAGNOSIS — M8588 Other specified disorders of bone density and structure, other site: Secondary | ICD-10-CM | POA: Diagnosis not present

## 2016-12-30 DIAGNOSIS — E7800 Pure hypercholesterolemia, unspecified: Secondary | ICD-10-CM | POA: Diagnosis not present

## 2016-12-30 DIAGNOSIS — E669 Obesity, unspecified: Secondary | ICD-10-CM | POA: Diagnosis not present

## 2016-12-30 DIAGNOSIS — M545 Low back pain: Secondary | ICD-10-CM | POA: Diagnosis not present

## 2016-12-30 DIAGNOSIS — K219 Gastro-esophageal reflux disease without esophagitis: Secondary | ICD-10-CM | POA: Diagnosis not present

## 2017-01-03 ENCOUNTER — Other Ambulatory Visit (HOSPITAL_COMMUNITY)
Admission: RE | Admit: 2017-01-03 | Discharge: 2017-01-03 | Disposition: A | Discharge status: Home / Self Care | Payer: 59 | Source: Ambulatory Visit | Attending: General Surgery | Admitting: General Surgery

## 2017-01-03 LAB — CBC WITH DIFFERENTIAL/PLATELET
BASOS ABS: 0 10*3/uL (ref 0.0–0.1)
Basophils Relative: 1 %
EOS PCT: 2 %
Eosinophils Absolute: 0.1 10*3/uL (ref 0.0–0.7)
HCT: 42.9 % (ref 36.0–46.0)
HEMOGLOBIN: 13.8 g/dL (ref 12.0–15.0)
LYMPHS PCT: 34 %
Lymphs Abs: 2 10*3/uL (ref 0.7–4.0)
MCH: 25.6 pg — ABNORMAL LOW (ref 26.0–34.0)
MCHC: 32.2 g/dL (ref 30.0–36.0)
MCV: 79.4 fL (ref 78.0–100.0)
MONO ABS: 0.4 10*3/uL (ref 0.1–1.0)
MONOS PCT: 7 %
NEUTROS ABS: 3.4 10*3/uL (ref 1.7–7.7)
NEUTROS PCT: 56 %
PLATELETS: 322 10*3/uL (ref 150–400)
RBC: 5.4 MIL/uL — AB (ref 3.87–5.11)
RDW: 14.7 % (ref 11.5–15.5)
WBC: 6 10*3/uL (ref 4.0–10.5)

## 2017-01-03 LAB — COMPREHENSIVE METABOLIC PANEL
ALBUMIN: 3.9 g/dL (ref 3.5–5.0)
ALT: 19 U/L (ref 14–54)
AST: 23 U/L (ref 15–41)
Alkaline Phosphatase: 131 U/L — ABNORMAL HIGH (ref 38–126)
Anion gap: 9 (ref 5–15)
BUN: 13 mg/dL (ref 6–20)
CHLORIDE: 106 mmol/L (ref 101–111)
CO2: 25 mmol/L (ref 22–32)
CREATININE: 0.81 mg/dL (ref 0.44–1.00)
Calcium: 9.4 mg/dL (ref 8.9–10.3)
GFR calc Af Amer: >60 mL/min (ref >60)
GFR calc non Af Amer: >60 mL/min (ref >60)
Glucose, Bld: 89 mg/dL (ref 65–99)
POTASSIUM: 4.1 mmol/L (ref 3.5–5.1)
SODIUM: 140 mmol/L (ref 135–145)
Total Bilirubin: 0.8 mg/dL (ref 0.3–1.2)
Total Protein: 7.2 g/dL (ref 6.5–8.1)

## 2017-01-03 LAB — IRON AND TIBC
Iron: 37 ug/dL (ref 28–170)
SATURATION RATIOS: 8 % — AB (ref 10.4–31.8)
TIBC: 455 ug/dL — ABNORMAL HIGH (ref 250–450)
UIBC: 418 ug/dL

## 2017-01-03 LAB — TSH: TSH: 1.654 u[IU]/mL (ref 0.350–4.500)

## 2017-01-03 LAB — VITAMIN B12: Vitamin B-12: 813 pg/mL (ref 180–914)

## 2017-01-04 LAB — HEMOGLOBIN A1C
HEMOGLOBIN A1C: 5.4 % (ref 4.8–5.6)
Mean Plasma Glucose: 108 mg/dL

## 2017-01-04 LAB — VITAMIN D 25 HYDROXY (VIT D DEFICIENCY, FRACTURES): VIT D 25 HYDROXY: 29 ng/mL — AB (ref 30.0–100.0)

## 2017-01-05 LAB — VITAMIN B1: Vitamin B1 (Thiamine): 122.3 nmol/L (ref 66.5–200.0)

## 2017-01-06 ENCOUNTER — Ambulatory Visit (HOSPITAL_COMMUNITY): Payer: 59

## 2017-01-09 MED FILL — CLIMARA PRO PATCH: 0.045-0.015 | 28 days supply | Qty: 4 | Fill #2

## 2017-01-18 ENCOUNTER — Encounter: Payer: 59 | Attending: General Surgery | Admitting: Registered"

## 2017-01-18 ENCOUNTER — Encounter: Payer: Self-pay | Admitting: Registered"

## 2017-01-18 DIAGNOSIS — Z713 Dietary counseling and surveillance: Secondary | ICD-10-CM | POA: Insufficient documentation

## 2017-01-18 DIAGNOSIS — E669 Obesity, unspecified: Secondary | ICD-10-CM | POA: Diagnosis not present

## 2017-01-18 NOTE — Progress Notes (Signed)
Pre-Op Assessment Visit:  Pre-Operative Sleeve Gastrectomy Surgery  Medical Nutrition Therapy:  Appt start time: 3:25  End time:  4:25  Patient was seen on 05/30/201/ for Pre-Operative Nutrition Assessment. Assessment and letter of approval faxed to [facility] Bariatric Surgery Program coordinator on 01/18/2017.   Pt expectation of surgery: increase mobility, increase energy, feel better, decrease cholesterol, be healthier   Pt expectation of Dietitian: what to eat after surgery and amounts  Start weight at [facility]: 237.1 BMI: 37.98   Pt states she has a sweet daily.    Per insurance, pt needs 0 SWL visits prior to surgery.    24 hr Dietary Recall: First Meal: pimento cheese sandwich  Snack: nuts and dried fruit Second Meal: hot dog, yogurt Snack: fresh fruit Third Meal: chicken salad sandwich or chicken, rice, green beans, cake Snack: sometimes popcorn, ice cream Beverages: sweet tea, whole milk (sometimes)  Encouraged to engage in 150 minutes of moderate physical activity including cardiovascular and weight baring weekly  Handouts given during visit include:  . Pre-Op Goals . Bariatric Surgery Protein Shakes . Vitamin and Mineral Recommendations  During the appointment today the following Pre-Op Goals were reviewed with the patient: . Maintain or lose weight as instructed by your surgeon . Make healthy food choices . Begin to limit portion sizes . Limited concentrated sugars and fried foods . Keep fat/sugar in the single digits per serving on         food labels . Practice CHEWING your food  (aim for 30 chews per bite or until applesauce consistency) . Practice not drinking 15 minutes before, during, and 30 minutes after each meal/snack . Avoid all carbonated beverages  . Avoid/limit caffeinated beverages  . Avoid all sugar-sweetened beverages . Consume 3 meals per day; eat every 3-5 hours . Make a list of non-food related activities . Aim for 64-100  ounces of FLUID daily  . Aim for at least 60-80 grams of PROTEIN daily . Look for a liquid protein source that contain ?15 g protein and ?5 g carbohydrate  (ex: shakes, drinks, shots) . Physical activity is an important part of a healthy lifestyle so keep it moving!  Follow diet recommendations listed below Energy and Macronutrient Recommendations: Calories: 1800 Carbohydrate: 200 Protein: 135 Fat: 50  Demonstrated degree of understanding via:  Teach Back   Teaching Method Utilized:  Visual Auditory Hands on  Barriers to learning/adherence to lifestyle change: none  Patient to call the [facility] to enroll in Pre-Op and Post-Op Nutrition Education when surgery date is scheduled.

## 2017-01-19 ENCOUNTER — Other Ambulatory Visit (HOSPITAL_COMMUNITY): Payer: Self-pay | Admitting: General Surgery

## 2017-01-19 DIAGNOSIS — E669 Obesity, unspecified: Secondary | ICD-10-CM

## 2017-01-20 ENCOUNTER — Ambulatory Visit (HOSPITAL_COMMUNITY)
Admission: RE | Admit: 2017-01-20 | Discharge: 2017-01-20 | Disposition: A | Discharge status: Home / Self Care | Payer: 59 | Source: Ambulatory Visit | Attending: General Surgery | Admitting: General Surgery

## 2017-01-20 ENCOUNTER — Ambulatory Visit: Payer: 59

## 2017-01-20 ENCOUNTER — Other Ambulatory Visit: Payer: Self-pay

## 2017-01-20 ENCOUNTER — Ambulatory Visit (HOSPITAL_COMMUNITY): Payer: 59

## 2017-01-20 ENCOUNTER — Ambulatory Visit (HOSPITAL_COMMUNITY)
Admission: RE | Admit: 2017-01-20 | Discharge: 2017-01-20 | Disposition: A | Discharge status: Home / Self Care | Payer: 59 | Source: Ambulatory Visit | Attending: Pulmonary Disease | Admitting: Pulmonary Disease

## 2017-01-20 DIAGNOSIS — Z6830 Body mass index (BMI) 30.0-30.9, adult: Secondary | ICD-10-CM | POA: Diagnosis not present

## 2017-01-20 DIAGNOSIS — K219 Gastro-esophageal reflux disease without esophagitis: Secondary | ICD-10-CM | POA: Insufficient documentation

## 2017-01-20 DIAGNOSIS — E669 Obesity, unspecified: Secondary | ICD-10-CM | POA: Insufficient documentation

## 2017-01-24 MED FILL — NAPROXEN 500 MG TABLET: 500 | 60 days supply | Qty: 60 | Fill #2

## 2017-01-25 ENCOUNTER — Telehealth: Payer: Self-pay | Admitting: Adult Health

## 2017-01-25 DIAGNOSIS — Z780 Asymptomatic menopausal state: Secondary | ICD-10-CM

## 2017-01-25 NOTE — Telephone Encounter (Signed)
Pt requested DEXA

## 2017-02-13 ENCOUNTER — Ambulatory Visit (INDEPENDENT_AMBULATORY_CARE_PROVIDER_SITE_OTHER): Payer: 59 | Admitting: Psychology

## 2017-02-13 DIAGNOSIS — E669 Obesity, unspecified: Secondary | ICD-10-CM | POA: Diagnosis not present

## 2017-02-13 DIAGNOSIS — F5089 Other specified eating disorder: Secondary | ICD-10-CM

## 2017-02-24 ENCOUNTER — Ambulatory Visit (INDEPENDENT_AMBULATORY_CARE_PROVIDER_SITE_OTHER): Payer: 59 | Admitting: Psychology

## 2017-02-24 DIAGNOSIS — F5081 Binge eating disorder: Secondary | ICD-10-CM | POA: Diagnosis not present

## 2017-02-24 DIAGNOSIS — E669 Obesity, unspecified: Secondary | ICD-10-CM | POA: Diagnosis not present

## 2017-02-27 ENCOUNTER — Encounter: Payer: 59 | Attending: General Surgery | Admitting: Skilled Nursing Facility1

## 2017-02-27 DIAGNOSIS — E669 Obesity, unspecified: Secondary | ICD-10-CM | POA: Diagnosis not present

## 2017-02-27 DIAGNOSIS — Z713 Dietary counseling and surveillance: Secondary | ICD-10-CM | POA: Insufficient documentation

## 2017-03-01 ENCOUNTER — Encounter: Payer: Self-pay | Admitting: Skilled Nursing Facility1

## 2017-03-01 NOTE — Progress Notes (Signed)
Pre-Operative Nutrition Class:  Appt start time: 9968   End time:  1830.  Patient was seen on 02/27/2017 for Pre-Operative Bariatric Surgery Education at [facility].   Surgery date: Surgery type: sleeve Start weight at [facility]: 237 Weight today: 247.4  Samples given per MNT protocol. Patient educated on appropriate usage: celebrateMultivitamin Lot # [id number] Exp: 01/2018  celebrateCalcium Citrate Lot # [id number] Exp: 11/2017  Renee Pain Protein shake Lot # [id number] Exp: Jan 16, 2018  The following the learning objectives were met by the patient during this course:  Identify Pre-Op Dietary Goals and will begin 2 weeks pre-operatively  Identify appropriate sources of fluids and proteins   State protein recommendations and appropriate sources pre and post-operatively  Identify Post-Operative Dietary Goals and will follow for 2 weeks post-operatively  Identify appropriate multivitamin and calcium sources  Describe the need for physical activity post-operatively and will follow MD recommendations  State when to call healthcare provider regarding medication questions or post-operative complications  Handouts given during class include:  Pre-Op Bariatric Surgery Diet Handout  Protein Shake Handout  Post-Op Bariatric Surgery Nutrition Handout  [facility] Program Information Flyer  Support Group Information Flyer  [facility] Bariatric Supplements Price List  Follow-Up Plan: [facility] diet advancement per MD.

## 2017-03-02 ENCOUNTER — Ambulatory Visit (HOSPITAL_COMMUNITY)
Admission: RE | Admit: 2017-03-02 | Discharge: 2017-03-02 | Disposition: A | Discharge status: Home / Self Care | Payer: 59 | Source: Ambulatory Visit | Attending: Adult Health | Admitting: Adult Health

## 2017-03-02 DIAGNOSIS — Z780 Asymptomatic menopausal state: Secondary | ICD-10-CM | POA: Diagnosis not present

## 2017-03-02 DIAGNOSIS — M8580 Other specified disorders of bone density and structure, unspecified site: Secondary | ICD-10-CM | POA: Diagnosis not present

## 2017-03-02 DIAGNOSIS — M8588 Other specified disorders of bone density and structure, other site: Secondary | ICD-10-CM | POA: Diagnosis not present

## 2017-03-02 DIAGNOSIS — E669 Obesity, unspecified: Secondary | ICD-10-CM | POA: Diagnosis not present

## 2017-03-02 DIAGNOSIS — K219 Gastro-esophageal reflux disease without esophagitis: Secondary | ICD-10-CM | POA: Diagnosis not present

## 2017-03-02 DIAGNOSIS — M545 Low back pain: Secondary | ICD-10-CM | POA: Diagnosis not present

## 2017-03-02 DIAGNOSIS — E7800 Pure hypercholesterolemia, unspecified: Secondary | ICD-10-CM | POA: Diagnosis not present

## 2017-03-03 MED FILL — PANTOPRAZOLE SOD DR 40 MG T: 40 | 30 days supply | Qty: 30 | Fill #0

## 2017-03-03 MED FILL — ONDANSETRON HCL 4 MG TABLET: 4 | 5 days supply | Qty: 15 | Fill #0

## 2017-03-03 MED FILL — oxyCODONE HCL 5 MG/5ML SOLN: 5 | 2 days supply | Qty: 100 | Fill #0

## 2017-03-06 NOTE — Progress Notes (Signed)
Please place orders in EPIC as patient is being scheduled for a pre-op appointment! Thank you! 

## 2017-03-07 NOTE — Progress Notes (Signed)
Please place orders in EPIC as patient has a pre-op appointment on 03/09/2017! Thank you!

## 2017-03-08 ENCOUNTER — Encounter (HOSPITAL_COMMUNITY): Payer: Self-pay

## 2017-03-09 ENCOUNTER — Encounter (HOSPITAL_COMMUNITY): Payer: Self-pay

## 2017-03-09 ENCOUNTER — Encounter (HOSPITAL_COMMUNITY)
Admission: RE | Admit: 2017-03-09 | Discharge: 2017-03-09 | Disposition: A | Discharge status: Home / Self Care | Payer: 59 | Source: Ambulatory Visit | Attending: General Surgery | Admitting: General Surgery

## 2017-03-09 DIAGNOSIS — K219 Gastro-esophageal reflux disease without esophagitis: Secondary | ICD-10-CM | POA: Insufficient documentation

## 2017-03-09 DIAGNOSIS — Z01812 Encounter for preprocedural laboratory examination: Secondary | ICD-10-CM | POA: Insufficient documentation

## 2017-03-09 DIAGNOSIS — M8580 Other specified disorders of bone density and structure, unspecified site: Secondary | ICD-10-CM | POA: Diagnosis not present

## 2017-03-09 DIAGNOSIS — Z6838 Body mass index (BMI) 38.0-38.9, adult: Secondary | ICD-10-CM | POA: Diagnosis not present

## 2017-03-09 DIAGNOSIS — E7800 Pure hypercholesterolemia, unspecified: Secondary | ICD-10-CM | POA: Diagnosis not present

## 2017-03-09 HISTORY — DX: Pneumonia, unspecified organism: J18.9

## 2017-03-09 HISTORY — DX: Other specified postprocedural states: R11.2

## 2017-03-09 HISTORY — DX: Personal history of urinary calculi: Z87.442

## 2017-03-09 HISTORY — DX: Other specified postprocedural states: Z98.890

## 2017-03-09 LAB — CBC
HEMATOCRIT: 41.3 % (ref 36.0–46.0)
HEMOGLOBIN: 13.5 g/dL (ref 12.0–15.0)
MCH: 25.3 pg — AB (ref 26.0–34.0)
MCHC: 32.7 g/dL (ref 30.0–36.0)
MCV: 77.3 fL — ABNORMAL LOW (ref 78.0–100.0)
Platelets: 359 10*3/uL (ref 150–400)
RBC: 5.34 MIL/uL — AB (ref 3.87–5.11)
RDW: 14.9 % (ref 11.5–15.5)
WBC: 9.4 10*3/uL (ref 4.0–10.5)

## 2017-03-09 NOTE — Patient Instructions (Signed)
Courtney Bradley  03/09/2017   Your procedure is scheduled on: 03/14/17  Report to [facility] Main  Entrance Take [place]  elevators to 3rd floor to  [place] at   945 AM.    Call this number if you have problems the morning of surgery [phone]    Remember: ONLY 1 PERSON MAY GO WITH YOU TO SHORT STAY TO GET  READY MORNING OF YOUR SURGERY.  Do not eat food or drink liquids :After Midnight.     Take these medicines the morning of surgery with A SIP OF WATER: protonix if needed                                You may not have any metal on your body including hair pins and              piercings  Do not wear jewelry, make-up, lotions, powders or perfumes, deodorant             Do not wear nail polish.  Do not shave  48 hours prior to surgery.               Do not bring valuables to the hospital. [place].  Contacts, dentures or bridgework may not be worn into surgery.  Leave suitcase in the car. After surgery it may be brought to your room.                Please read over the following fact sheets you were given: _____________________________________________________________________             [facility] - Preparing for Surgery Before surgery, you can play an important role.  Because skin is not sterile, your skin needs to be as free of germs as possible.  You can reduce the number of germs on your skin by washing with CHG (chlorahexidine gluconate) soap before surgery.  CHG is an antiseptic cleaner which kills germs and bonds with the skin to continue killing germs even after washing. Please DO NOT use if you have an allergy to CHG or antibacterial soaps.  If your skin becomes reddened/irritated stop using the CHG and inform your nurse when you arrive at Short Stay. Do not shave (including legs and underarms) for at least 48 hours prior to the first CHG shower.  You may shave your  face/neck. Please follow these instructions carefully:  1.  Shower with CHG Soap the night before surgery and the  morning of Surgery.  2.  If you choose to wash your hair, wash your hair first as usual with your  normal  shampoo.  3.  After you shampoo, rinse your hair and body thoroughly to remove the  shampoo.                           4.  Use CHG as you would any other liquid soap.  You can apply chg directly  to the skin and wash                       Gently with a scrungie or clean washcloth.  5.  Apply the CHG Soap to your  body ONLY FROM THE NECK DOWN.   Do not use on face/ open                           Wound or open sores. Avoid contact with eyes, ears mouth and genitals (private parts).                       Wash face,  Genitals (private parts) with your normal soap.             6.  Wash thoroughly, paying special attention to the area where your surgery  will be performed.  7.  Thoroughly rinse your body with warm water from the neck down.  8.  DO NOT shower/wash with your normal soap after using and rinsing off  the CHG Soap.                9.  Pat yourself dry with a clean towel.            10.  Wear clean pajamas.            11.  Place clean sheets on your bed the night of your first shower and do not  sleep with pets. Day of Surgery : Do not apply any lotions/deodorants the morning of surgery.  Please wear clean clothes to the hospital/surgery center.  FAILURE TO FOLLOW THESE INSTRUCTIONS MAY RESULT IN THE CANCELLATION OF YOUR SURGERY PATIENT SIGNATURE_________________________________  NURSE SIGNATURE__________________________________  ________________________________________________________________________

## 2017-03-09 NOTE — Progress Notes (Signed)
ekg and cxr 01/20/17 in epic

## 2017-03-10 ENCOUNTER — Other Ambulatory Visit: Payer: Self-pay | Admitting: *Deleted

## 2017-03-10 NOTE — Patient Outreach (Signed)
Courtney Bradley) Care Management  03/10/2017  Courtney Bradley 03-08-1951 [id number]  Subjective:Telephone call to patient's home number, no answer, left HIPAA compliant voicemail message, and requested call back.    Objective: Per chart review, patient schedule to be admitted on 7/ 24/18 for LAPAROSCOPIC GASTRIC SLEEVE RESECTION.  Patient has a history of: obesity, depression, GERD, and Osteopenia.   Assessment: Received  THN Consul for UMR  Preoperative Call on 03/03/17.  Preoperative call follow up pending patient contact.    Plan: RNCM will call patient for 2nd telephone outreach attempt, preoperative call follow up, within 10 business days if no return call.    Courtney Bradley H. Annia Friendly, BSN, [place] [facility] Telephonic CM Phone: [phone] Fax: [phone]

## 2017-03-13 ENCOUNTER — Other Ambulatory Visit: Payer: Self-pay | Admitting: *Deleted

## 2017-03-13 NOTE — Patient Outreach (Signed)
[facility]) Care Management  03/13/2017  Courtney Bradley 09/27/1950 [id number]    Subjective: Received voicemail message, times 2,  from patient, states she is returning call, and can be reached at home number this am until approximately 9:00am. Telephone call to patient's home / mobile number, spoke with patient, and HIPAA verified.  Discussed [facility] Care Management UMR Transition of care follow up, preoperative call follow up, patient voiced understanding, and is in agreement to both types of follow up.  Patient states she is doing well, has no questions regarding upcoming surgery on 03/14/17.  Patient voices understanding of medical diagnosis and treatment plan. States she does not have hypertension or diabetes.  States she has a history of high cholesterol, that she is able to self manage with medications.  States she will be having  LAPAROSCOPIC GASTRIC SLEEVE RESECTION at [facility] and is aware she will receive highest level inpatient insurance benefit due to using a [facility] facility.  States she is accessing the following [facility] benefits: outpatient pharmacy, hospital indemnity (did not choose this benefit), and has been approved for family medical leave act (FMLA) through [employer]. States she has Advanced Directives in place.   Patient states she does not have any preoperative questions, care coordination, disease management, disease monitoring, transportation, community resource, or pharmacy needs at this time.   Objective: Per chart review, patient schedule to be admitted on 7/ 24/18 for LAPAROSCOPIC GASTRIC SLEEVE RESECTION.  Patient has a history of: obesity, depression, GERD, and Osteopenia.   Assessment: Received  [facility] Consul for UMR Preoperative Call on 03/03/17.  Preoperative call completed, and transition of care follow up pending notification of patient discharge.  Plan: RNCM will call patient for  telephone outreach attempt, transition of care follow up,  within 3 business days of hospital discharge notification.    Kinzley Savell H. Annia Friendly, BSN, [facility] Telephonic CM Phone: [phone] Fax: [phone]

## 2017-03-14 ENCOUNTER — Inpatient Hospital Stay (HOSPITAL_COMMUNITY): Payer: 59 | Admitting: Registered Nurse

## 2017-03-14 ENCOUNTER — Observation Stay (HOSPITAL_COMMUNITY)
Admission: RE | Admit: 2017-03-14 | Discharge: 2017-03-15 | Disposition: A | Discharge status: Home / Self Care | Payer: 59 | Source: Ambulatory Visit | Attending: General Surgery | Admitting: General Surgery

## 2017-03-14 ENCOUNTER — Encounter (HOSPITAL_COMMUNITY)
Admission: RE | Disposition: A | Discharge status: Home / Self Care | Payer: Self-pay | Source: Ambulatory Visit | Attending: General Surgery

## 2017-03-14 ENCOUNTER — Encounter (HOSPITAL_COMMUNITY): Payer: Self-pay | Admitting: *Deleted

## 2017-03-14 ENCOUNTER — Ambulatory Visit: Payer: Self-pay | Admitting: General Surgery

## 2017-03-14 DIAGNOSIS — E7800 Pure hypercholesterolemia, unspecified: Secondary | ICD-10-CM | POA: Diagnosis not present

## 2017-03-14 DIAGNOSIS — M8580 Other specified disorders of bone density and structure, unspecified site: Secondary | ICD-10-CM | POA: Diagnosis not present

## 2017-03-14 DIAGNOSIS — M545 Low back pain: Secondary | ICD-10-CM | POA: Insufficient documentation

## 2017-03-14 DIAGNOSIS — K219 Gastro-esophageal reflux disease without esophagitis: Secondary | ICD-10-CM | POA: Diagnosis not present

## 2017-03-14 DIAGNOSIS — Z79899 Other long term (current) drug therapy: Secondary | ICD-10-CM | POA: Diagnosis not present

## 2017-03-14 DIAGNOSIS — K449 Diaphragmatic hernia without obstruction or gangrene: Secondary | ICD-10-CM | POA: Diagnosis not present

## 2017-03-14 DIAGNOSIS — Z6838 Body mass index (BMI) 38.0-38.9, adult: Secondary | ICD-10-CM | POA: Diagnosis not present

## 2017-03-14 DIAGNOSIS — Z23 Encounter for immunization: Secondary | ICD-10-CM | POA: Diagnosis not present

## 2017-03-14 DIAGNOSIS — Z880 Allergy status to penicillin: Secondary | ICD-10-CM | POA: Diagnosis not present

## 2017-03-14 DIAGNOSIS — E785 Hyperlipidemia, unspecified: Secondary | ICD-10-CM | POA: Diagnosis present

## 2017-03-14 DIAGNOSIS — Z9884 Bariatric surgery status: Secondary | ICD-10-CM

## 2017-03-14 HISTORY — PX: UPPER GI ENDOSCOPY: SHX6162

## 2017-03-14 HISTORY — PX: LAPAROSCOPIC GASTRIC SLEEVE RESECTION WITH HIATAL HERNIA REPAIR: SHX6512

## 2017-03-14 LAB — COMPREHENSIVE METABOLIC PANEL
ALBUMIN: 3.5 g/dL (ref 3.5–5.0)
ALK PHOS: 104 U/L (ref 38–126)
ALT: 61 U/L — ABNORMAL HIGH (ref 14–54)
ANION GAP: 6 (ref 5–15)
AST: 58 U/L — ABNORMAL HIGH (ref 15–41)
BILIRUBIN TOTAL: 0.4 mg/dL (ref 0.3–1.2)
BUN: 15 mg/dL (ref 6–20)
CALCIUM: 8.9 mg/dL (ref 8.9–10.3)
CO2: 26 mmol/L (ref 22–32)
Chloride: 107 mmol/L (ref 101–111)
Creatinine, Ser: 0.92 mg/dL (ref 0.44–1.00)
GFR calc non Af Amer: >60 mL/min (ref >60)
GLUCOSE: 139 mg/dL — AB (ref 65–99)
POTASSIUM: 4.1 mmol/L (ref 3.5–5.1)
Sodium: 139 mmol/L (ref 135–145)
TOTAL PROTEIN: 6.8 g/dL (ref 6.5–8.1)

## 2017-03-14 LAB — HEMOGLOBIN AND HEMATOCRIT, BLOOD
HCT: 38.4 % (ref 36.0–46.0)
Hemoglobin: 12.3 g/dL (ref 12.0–15.0)

## 2017-03-14 SURGERY — GASTRECTOMY, SLEEVE, LAPAROSCOPIC, WITH HIATAL HERNIA REPAIR
Anesthesia: General | Site: Esophagus

## 2017-03-14 MED ORDER — SODIUM CHLORIDE 0.9 % IJ SOLN
INTRAMUSCULAR | Status: AC
  Filled 2017-03-14: qty 50

## 2017-03-14 MED ORDER — LACTATED RINGERS IV SOLN
INTRAVENOUS | Status: DC
  Administered 2017-03-14 (×2): via INTRAVENOUS

## 2017-03-14 MED ORDER — BUPIVACAINE LIPOSOME 1.3 % IJ SUSP
20.0000 mL | Freq: Once | INTRAMUSCULAR | Status: AC
  Administered 2017-03-14: 20 mL
  Filled 2017-03-14: qty 20

## 2017-03-14 MED ORDER — PNEUMOCOCCAL VAC POLYVALENT 25 MCG/0.5ML IJ INJ
0.5000 mL | INJECTION | INTRAMUSCULAR | Status: AC
Start: 2017-03-15 — End: 2017-03-15
  Administered 2017-03-15: 0.5 mL via INTRAMUSCULAR
  Filled 2017-03-14: qty 0.5

## 2017-03-14 MED ORDER — LIDOCAINE 2% (20 MG/ML) 5 ML SYRINGE
INTRAMUSCULAR | Status: DC | PRN
  Administered 2017-03-14: 60 mg via INTRAVENOUS

## 2017-03-14 MED ORDER — FENTANYL CITRATE (PF) 100 MCG/2ML IJ SOLN
INTRAMUSCULAR | Status: DC | PRN
  Administered 2017-03-14: 100 ug via INTRAVENOUS

## 2017-03-14 MED ORDER — FENTANYL CITRATE (PF) 100 MCG/2ML IJ SOLN
25.0000 ug | INTRAMUSCULAR | Status: DC | PRN
  Administered 2017-03-14: 50 ug via INTRAVENOUS

## 2017-03-14 MED ORDER — PROPOFOL 10 MG/ML IV BOLUS
INTRAVENOUS | Status: DC | PRN
  Administered 2017-03-14: 140 mg via INTRAVENOUS

## 2017-03-14 MED ORDER — SIMETHICONE 80 MG PO CHEW: 80.0000 mg | CHEWABLE_TABLET | Freq: Four times a day (QID) | ORAL | Status: DC | PRN

## 2017-03-14 MED ORDER — ONDANSETRON HCL 4 MG/2ML IJ SOLN: 4.0000 mg | Freq: Four times a day (QID) | INTRAMUSCULAR | Status: DC | PRN

## 2017-03-14 MED ORDER — PROMETHAZINE HCL 25 MG/ML IJ SOLN
6.2500 mg | INTRAMUSCULAR | Status: AC | PRN
  Administered 2017-03-14 (×2): 6.25 mg via INTRAVENOUS

## 2017-03-14 MED ORDER — SUGAMMADEX SODIUM 200 MG/2ML IV SOLN
INTRAVENOUS | Status: DC | PRN
  Administered 2017-03-14: 200 mg via INTRAVENOUS

## 2017-03-14 MED ORDER — CHLORHEXIDINE GLUCONATE 4 % EX LIQD: 60.0000 mL | Freq: Once | CUTANEOUS | Status: DC

## 2017-03-14 MED ORDER — FENTANYL CITRATE (PF) 100 MCG/2ML IJ SOLN
INTRAMUSCULAR | Status: AC
  Filled 2017-03-14: qty 2

## 2017-03-14 MED ORDER — EPHEDRINE SULFATE-NACL 50-0.9 MG/10ML-% IV SOSY
PREFILLED_SYRINGE | INTRAVENOUS | Status: DC | PRN
  Administered 2017-03-14: 10 mg via INTRAVENOUS

## 2017-03-14 MED ORDER — 0.9 % SODIUM CHLORIDE (POUR BTL) OPTIME
TOPICAL | Status: DC | PRN
  Administered 2017-03-14: 1000 mL

## 2017-03-14 MED ORDER — EPHEDRINE 5 MG/ML INJ
INTRAVENOUS | Status: AC
  Filled 2017-03-14: qty 10

## 2017-03-14 MED ORDER — ACETAMINOPHEN 325 MG PO TABS: 650.0000 mg | ORAL_TABLET | ORAL | Status: DC | PRN

## 2017-03-14 MED ORDER — KETAMINE HCL 10 MG/ML IJ SOLN
INTRAMUSCULAR | Status: DC | PRN
  Administered 2017-03-14: 30 mg via INTRAVENOUS

## 2017-03-14 MED ORDER — MEPERIDINE HCL 50 MG/ML IJ SOLN: 6.2500 mg | INTRAMUSCULAR | Status: DC | PRN

## 2017-03-14 MED ORDER — OXYCODONE HCL 5 MG/5ML PO SOLN: 5.0000 mg | ORAL | Status: DC | PRN

## 2017-03-14 MED ORDER — DEXAMETHASONE SODIUM PHOSPHATE 4 MG/ML IJ SOLN: 4.0000 mg | INTRAMUSCULAR | Status: DC

## 2017-03-14 MED ORDER — PROMETHAZINE HCL 25 MG/ML IJ SOLN: 12.5000 mg | Freq: Four times a day (QID) | INTRAMUSCULAR | Status: DC | PRN

## 2017-03-14 MED ORDER — MIDAZOLAM HCL 5 MG/5ML IJ SOLN
INTRAMUSCULAR | Status: DC | PRN
  Administered 2017-03-14: 2 mg via INTRAVENOUS

## 2017-03-14 MED ORDER — DEXAMETHASONE SODIUM PHOSPHATE 10 MG/ML IJ SOLN
INTRAMUSCULAR | Status: AC
  Filled 2017-03-14: qty 1

## 2017-03-14 MED ORDER — FENTANYL CITRATE (PF) 250 MCG/5ML IJ SOLN
INTRAMUSCULAR | Status: AC
  Filled 2017-03-14: qty 5

## 2017-03-14 MED ORDER — LACTATED RINGERS IR SOLN
Status: DC | PRN
  Administered 2017-03-14: 1000 mL

## 2017-03-14 MED ORDER — STERILE WATER FOR IRRIGATION IR SOLN
Status: DC | PRN
  Administered 2017-03-14: 1000 mL

## 2017-03-14 MED ORDER — ONDANSETRON HCL 4 MG/2ML IJ SOLN
INTRAMUSCULAR | Status: DC | PRN
  Administered 2017-03-14: 4 mg via INTRAVENOUS

## 2017-03-14 MED ORDER — APREPITANT 40 MG PO CAPS
40.0000 mg | ORAL_CAPSULE | ORAL | Status: AC
  Administered 2017-03-14: 40 mg via ORAL
  Filled 2017-03-14: qty 1

## 2017-03-14 MED ORDER — KCL IN DEXTROSE-NACL 20-5-0.45 MEQ/L-%-% IV SOLN
INTRAVENOUS | Status: DC
  Administered 2017-03-14: 17:00:00 via INTRAVENOUS
  Administered 2017-03-15: 1000 mL via INTRAVENOUS
  Filled 2017-03-14 (×3): qty 1000

## 2017-03-14 MED ORDER — GABAPENTIN 300 MG PO CAPS
300.0000 mg | ORAL_CAPSULE | ORAL | Status: AC
  Administered 2017-03-14: 300 mg via ORAL
  Filled 2017-03-14: qty 1

## 2017-03-14 MED ORDER — DIPHENHYDRAMINE HCL 50 MG/ML IJ SOLN: 12.5000 mg | Freq: Three times a day (TID) | INTRAMUSCULAR | Status: DC | PRN

## 2017-03-14 MED ORDER — PANTOPRAZOLE SODIUM 40 MG IV SOLR
40.0000 mg | Freq: Every day | INTRAVENOUS | Status: DC
  Administered 2017-03-14: 40 mg via INTRAVENOUS
  Filled 2017-03-14: qty 40

## 2017-03-14 MED ORDER — LACTATED RINGERS IV SOLN: INTRAVENOUS | Status: DC

## 2017-03-14 MED ORDER — ROCURONIUM BROMIDE 10 MG/ML (PF) SYRINGE
PREFILLED_SYRINGE | INTRAVENOUS | Status: DC | PRN
  Administered 2017-03-14: 50 mg via INTRAVENOUS
  Administered 2017-03-14: 5 mg via INTRAVENOUS

## 2017-03-14 MED ORDER — MIDAZOLAM HCL 2 MG/2ML IJ SOLN
INTRAMUSCULAR | Status: AC
  Filled 2017-03-14: qty 2

## 2017-03-14 MED ORDER — SODIUM CHLORIDE 0.9 % IJ SOLN
INTRAMUSCULAR | Status: DC | PRN
  Administered 2017-03-14: 50 mL

## 2017-03-14 MED ORDER — HEPARIN SODIUM (PORCINE) 5000 UNIT/ML IJ SOLN
5000.0000 [IU] | INTRAMUSCULAR | Status: AC
  Administered 2017-03-14: 5000 [IU] via SUBCUTANEOUS
  Filled 2017-03-14: qty 1

## 2017-03-14 MED ORDER — ACETAMINOPHEN 160 MG/5ML PO SOLN: 325.0000 mg | ORAL | Status: DC | PRN

## 2017-03-14 MED ORDER — ROCURONIUM BROMIDE 50 MG/5ML IV SOSY
PREFILLED_SYRINGE | INTRAVENOUS | Status: AC
  Filled 2017-03-14: qty 5

## 2017-03-14 MED ORDER — LEVOFLOXACIN IN D5W 750 MG/150ML IV SOLN
750.0000 mg | INTRAVENOUS | Status: AC
  Administered 2017-03-14: 750 mg via INTRAVENOUS
  Filled 2017-03-14: qty 150

## 2017-03-14 MED ORDER — PROMETHAZINE HCL 25 MG/ML IJ SOLN
INTRAMUSCULAR | Status: AC
  Filled 2017-03-14: qty 1

## 2017-03-14 MED ORDER — ACETAMINOPHEN 500 MG PO TABS
1000.0000 mg | ORAL_TABLET | ORAL | Status: AC
  Administered 2017-03-14: 1000 mg via ORAL
  Filled 2017-03-14: qty 2

## 2017-03-14 MED ORDER — SUGAMMADEX SODIUM 200 MG/2ML IV SOLN
INTRAVENOUS | Status: AC
  Filled 2017-03-14: qty 2

## 2017-03-14 MED ORDER — MORPHINE SULFATE (PF) 2 MG/ML IV SOLN
1.0000 mg | INTRAVENOUS | Status: DC | PRN
  Filled 2017-03-14: qty 1

## 2017-03-14 MED ORDER — DEXAMETHASONE SODIUM PHOSPHATE 10 MG/ML IJ SOLN
INTRAMUSCULAR | Status: DC | PRN
  Administered 2017-03-14: 10 mg via INTRAVENOUS

## 2017-03-14 MED ORDER — ENOXAPARIN SODIUM 30 MG/0.3ML ~~LOC~~ SOLN
30.0000 mg | Freq: Two times a day (BID) | SUBCUTANEOUS | Status: DC
  Administered 2017-03-14 – 2017-03-15 (×2): 30 mg via SUBCUTANEOUS
  Filled 2017-03-14 (×2): qty 0.3

## 2017-03-14 MED ORDER — ONDANSETRON HCL 4 MG/2ML IJ SOLN
INTRAMUSCULAR | Status: AC
  Filled 2017-03-14: qty 2

## 2017-03-14 MED ORDER — PREMIER PROTEIN SHAKE: 2.0000 [oz_av] | ORAL | Status: DC

## 2017-03-14 MED ORDER — PROPOFOL 10 MG/ML IV BOLUS
INTRAVENOUS | Status: AC
  Filled 2017-03-14: qty 20

## 2017-03-14 MED ORDER — SCOPOLAMINE 1 MG/3DAYS TD PT72
1.0000 | MEDICATED_PATCH | TRANSDERMAL | Status: DC
  Administered 2017-03-14: 1.5 mg via TRANSDERMAL
  Filled 2017-03-14: qty 1

## 2017-03-14 SURGICAL SUPPLY — 69 items
APPLICATOR COTTON TIP 6IN STRL (MISCELLANEOUS) IMPLANT
APPLIER CLIP ROT 10 11.4 M/L (STAPLE)
APPLIER CLIP ROT 13.4 12 LRG (CLIP)
BANDAGE ADH SHEER 1  50/CT (GAUZE/BANDAGES/DRESSINGS) ×5 IMPLANT
BENZOIN TINCTURE PRP APPL 2/3 (GAUZE/BANDAGES/DRESSINGS) ×5 IMPLANT
BLADE SURG SZ11 CARB STEEL (BLADE) ×5 IMPLANT
CABLE HIGH FREQUENCY MONO STRZ (ELECTRODE) ×5 IMPLANT
CHLORAPREP W/TINT 26ML (MISCELLANEOUS) ×5 IMPLANT
CLIP APPLIE ROT 10 11.4 M/L (STAPLE) IMPLANT
CLIP APPLIE ROT 13.4 12 LRG (CLIP) IMPLANT
CLOSURE WOUND 1/2 X4 (GAUZE/BANDAGES/DRESSINGS) ×1
DECANTER SPIKE VIAL GLASS SM (MISCELLANEOUS) ×5 IMPLANT
DERMABOND ADVANCED (GAUZE/BANDAGES/DRESSINGS)
DERMABOND ADVANCED .7 DNX12 (GAUZE/BANDAGES/DRESSINGS) IMPLANT
DEVICE SUT QUICK LOAD TK 5 (STAPLE) ×8 IMPLANT
DEVICE SUT TI-KNOT TK 5X26 (MISCELLANEOUS) ×4 IMPLANT
DEVICE SUTURE ENDOST 10MM (ENDOMECHANICALS) IMPLANT
DEVICE TI KNOT TK5 (MISCELLANEOUS) ×1
DRAPE UTILITY XL STRL (DRAPES) ×10 IMPLANT
ELECT L-HOOK LAP 45CM DISP (ELECTROSURGICAL)
ELECT PENCIL ROCKER SW 15FT (MISCELLANEOUS) IMPLANT
ELECT REM PT RETURN 15FT ADLT (MISCELLANEOUS) ×5 IMPLANT
ELECTRODE L-HOOK LAP 45CM DISP (ELECTROSURGICAL) IMPLANT
GAUZE SPONGE 2X2 8PLY STRL LF (GAUZE/BANDAGES/DRESSINGS) IMPLANT
GAUZE SPONGE 4X4 12PLY STRL (GAUZE/BANDAGES/DRESSINGS) IMPLANT
GLOVE BIO SURGEON STRL SZ7.5 (GLOVE) ×5 IMPLANT
GLOVE INDICATOR 8.0 STRL GRN (GLOVE) ×5 IMPLANT
GOWN STRL REUS W/TWL XL LVL3 (GOWN DISPOSABLE) ×30 IMPLANT
GRASPER SUT TROCAR 14GX15 (MISCELLANEOUS) ×5 IMPLANT
HOVERMATT SINGLE USE (MISCELLANEOUS) ×5 IMPLANT
KIT BASIN OR (CUSTOM PROCEDURE TRAY) ×5 IMPLANT
KIT DEFENDO BUTTON (KITS) ×5 IMPLANT
MARKER SKIN DUAL TIP RULER LAB (MISCELLANEOUS) ×5 IMPLANT
NEEDLE SPNL 22GX3.5 QUINCKE BK (NEEDLE) ×5 IMPLANT
PACK UNIVERSAL I (CUSTOM PROCEDURE TRAY) ×5 IMPLANT
QUICK LOAD TK 5 (STAPLE) ×2
RELOAD STAPLER BLUE 60MM (STAPLE) ×9 IMPLANT
RELOAD STAPLER GOLD 60MM (STAPLE) ×3 IMPLANT
RELOAD STAPLER GREEN 60MM (STAPLE) ×3 IMPLANT
SCISSORS LAP 5X45 EPIX DISP (ENDOMECHANICALS) IMPLANT
SEALANT SURGICAL APPL DUAL CAN (MISCELLANEOUS) IMPLANT
SET IRRIG TUBING LAPAROSCOPIC (IRRIGATION / IRRIGATOR) ×5 IMPLANT
SHEARS HARMONIC ACE PLUS 45CM (MISCELLANEOUS) ×5 IMPLANT
SLEEVE GASTRECTOMY 40FR VISIGI (MISCELLANEOUS) ×5 IMPLANT
SLEEVE XCEL OPT CAN 5 100 (ENDOMECHANICALS) ×15 IMPLANT
SOLUTION ANTI FOG 6CC (MISCELLANEOUS) ×5 IMPLANT
SPONGE GAUZE 2X2 STER 10/PKG (GAUZE/BANDAGES/DRESSINGS)
SPONGE LAP 18X18 X RAY DECT (DISPOSABLE) ×5 IMPLANT
STAPLER ECHELON BIOABSB 60 FLE (MISCELLANEOUS) ×25 IMPLANT
STAPLER ECHELON LONG 60 440 (INSTRUMENTS) IMPLANT
STAPLER RELOAD BLUE 60MM (STAPLE) ×15
STAPLER RELOAD GOLD 60MM (STAPLE) ×5
STAPLER RELOAD GREEN 60MM (STAPLE) ×5
STRIP CLOSURE SKIN 1/2X4 (GAUZE/BANDAGES/DRESSINGS) ×4 IMPLANT
SUT MNCRL AB 4-0 PS2 18 (SUTURE) ×5 IMPLANT
SUT SURGIDAC NAB ES-9 0 48 120 (SUTURE) ×10 IMPLANT
SUT VICRYL 0 TIES 12 18 (SUTURE) ×5 IMPLANT
SYR 10ML ECCENTRIC (SYRINGE) ×5 IMPLANT
SYR 20CC LL (SYRINGE) ×5 IMPLANT
SYR 50ML LL SCALE MARK (SYRINGE) ×5 IMPLANT
TOWEL OR 17X26 10 PK STRL BLUE (TOWEL DISPOSABLE) ×5 IMPLANT
TOWEL OR NON WOVEN STRL DISP B (DISPOSABLE) ×5 IMPLANT
TRAY FOLEY W/METER SILVER 16FR (SET/KITS/TRAYS/PACK) IMPLANT
TROCAR BLADELESS 15MM (ENDOMECHANICALS) ×5 IMPLANT
TROCAR BLADELESS OPT 5 100 (ENDOMECHANICALS) ×5 IMPLANT
TUBING CONNECTING 10 (TUBING) ×8 IMPLANT
TUBING CONNECTING 10' (TUBING) ×2
TUBING ENDO SMARTCAP (MISCELLANEOUS) ×5 IMPLANT
TUBING INSUF HEATED (TUBING) ×5 IMPLANT

## 2017-03-14 NOTE — Transfer of Care (Signed)
Immediate Anesthesia Transfer of Care Note  Patient: Courtney Bradley  Procedure(s) Performed: Procedure(s): LAPAROSCOPIC GASTRIC SLEEVE RESECTION WITH HIATAL HERNIA REPAIR (N/A) UPPER GI ENDOSCOPY  Patient Location: PACU  Anesthesia Type:General  Level of Consciousness: awake, alert , oriented and patient cooperative  Airway & Oxygen Therapy: Patient Spontanous Breathing and Patient connected to face mask oxygen  Post-op Assessment: Report given to RN, Post -op Vital signs reviewed and stable and Patient moving all extremities  Post vital signs: Reviewed and stable  Last Vitals:  Vitals:   03/14/17 0914  BP: 140/80  Pulse: 74  Resp: 18  Temp: 37.2 C    Last Pain:  Vitals:   03/14/17 0914  TempSrc: Oral      Patients Stated Pain Goal: 3 (69/50/72 2575)  Complications: No apparent anesthesia complications

## 2017-03-14 NOTE — Anesthesia Procedure Notes (Signed)
Procedure Name: Intubation Date/Time: 03/14/2017 12:30 PM Performed by: Carleene Cooper A Pre-anesthesia Checklist: Patient identified, Emergency Drugs available, Patient being monitored, Suction available and Timeout performed Patient Re-evaluated:Patient Re-evaluated prior to induction Oxygen Delivery Method: Circle system utilized Preoxygenation: Pre-oxygenation with 100% oxygen Induction Type: IV induction Ventilation: Mask ventilation without difficulty Laryngoscope Size: Mac and 4 Grade View: Grade I Tube type: Oral Tube size: 7.0 mm Number of attempts: 1 Airway Equipment and Method: Stylet Placement Confirmation: ETT inserted through vocal cords under direct vision,  positive ETCO2 and breath sounds checked- equal and bilateral Secured at: 20 cm Tube secured with: Tape Dental Injury: Teeth and Oropharynx as per pre-operative assessment  Comments: Intubation by M. Gregary Cromer, [place]

## 2017-03-14 NOTE — Interval H&P Note (Signed)
History and Physical Interval Note:  03/14/2017 11:28 AM  Courtney Bradley  has presented today for surgery, with the diagnosis of Morbid Obesity, GERD, Osteopenia, Hypercholesterolemia  The various methods of treatment have been discussed with the patient and family. After consideration of risks, benefits and other options for treatment, the patient has consented to  Procedure(s): LAPAROSCOPIC GASTRIC SLEEVE RESECTION (N/A) as a surgical intervention .  The patient's history has been reviewed, patient examined, no change in status, stable for surgery.  I have reviewed the patient's chart and labs.  Questions were answered to the patient's satisfaction.    Leighton Ruff. Redmond Pulling, MD, [place], Bariatric, & Minimally Invasive Surgery [facility], [place]  [facility]

## 2017-03-14 NOTE — Op Note (Signed)
03/14/2017 Courtney Bradley 1951-07-16 [id number]   PRE-OPERATIVE DIAGNOSIS:     Morbid obesity BMI 38   Osteopenia   Dyslipidemia   GERD (gastroesophageal reflux disease)  POST-OPERATIVE DIAGNOSIS:  Same + hiatal hernia  PROCEDURE:  Procedure(s): LAPAROSCOPIC SLEEVE GASTRECTOMY WITH HIATAL HERNIA REPAIR UPPER GI ENDOSCOPY  SURGEON:  Surgeon(s): Gayland Curry, MD FACS FASMBS  ASSISTANTS: Excell Seltzer MD FACS  ANESTHESIA:   general  DRAINS: none   BOUGIE: 61 fr ViSiGi  LOCAL MEDICATIONS USED:  MARCAINE + Exparel  SPECIMEN:  Source of Specimen:  Greater curvature of stomach  DISPOSITION OF SPECIMEN:  PATHOLOGY  COUNTS:  YES  INDICATION FOR PROCEDURE: This is a very pleasant 66 y.o.-year-old morbidly obese female who has had unsuccessful attempts for sustained weight loss. The patient presents today for a planned laparoscopic sleeve gastrectomy with upper endoscopy. We have discussed the risk and benefits of the procedure extensively preoperatively. Please see my separate notes.  PROCEDURE: After obtaining informed consent and receiving 5000 units of subcutaneous heparin, the patient was brought to the operating room at [facility] and placed supine on the operating room table. General endotracheal anesthesia was established. Sequential compression devices were placed. A orogastric tube was placed. The patient's abdomen was prepped and draped in the usual standard surgical fashion. The patient received preoperative IV antibiotic. A surgical timeout was performed.  Access to the abdomen was achieved using a 5 mm 0 laparoscope thru a 5 mm trocar In the left upper Quadrant 2 fingerbreadths below the left subcostal margin using the Optiview technique. Pneumoperitoneum was smoothly established up to 15 mm of mercury. The laparoscope was advanced and the abdominal cavity was surveilled. There were some thin omental adhesions to upper midline anterior abdominal wall which  were taken down with harmonic after  A 5 mm trocar was placed slightly above and to the left of the umbilicus under direct visualization.  The patient was then placed in reverse Trendelenburg. There was evidence of a hiatal hernia on laparoscopy - gap in the left and right crus anteriorly.   The [facility] liver retractor was placed under the left lobe of the liver through a 5 mm trocar incision site in the subxiphoid position. A 5 mm trocar was placed in the lateral right upper quadrant along with a 15 mm trocar in the mid right abdomen. A final 5 mm trocar was placed in the lateral LUQ.  All under direct visualization after exparel had been infiltrated in the bilateral lateral upper abdominal walls as a TAP block.  The stomach was inspected. It was completely decompressed and the orogastric tube was removed.  There is a small anterior dimple that was obviously visible. Her preop UGI showed no hiatal hernia.  The calibration tube was placed in the oropharynx and guided down into the stomach by the CRNA. 10 mL of air was insufflated into the calibration balloon. The calibration tubing was then gently pulled back by the CRNA and it slid past the GE junction. At this point the calibration tubing was desufflated and pulled back into the esophagus. This confirmed my suspicion of a clinically significant hiatal hernia. The gastrohepatic ligament was incised with harmonic scalpel. The right crus was identified. We identified the crossing fat along the right crus. The adipose tissue just above this area was incised with harmonic scalpel. I then bluntly dissected out this area and identified the left crus. There was evidence of a hiatal hernia. I then mobilized the esophagus. The left  and right crus were further mobilized with blunt dissection. I was then able to reapproximate the left and right crus with 0 Ethibond using an Endostitch suture device and securing it with a titanium tyknot. I placed a second suture in a  similar fashion. We then had the CRNA readvanced the calibration tubing back into the stomach. 10 mL of air was insufflated into the calibration tube balloon. The calibration tube was then gently pulled back and there was resistance at the GE junction. The tube did not slide back up into the esophagus. At this point the calibration tubing was deflated and removed from the patient's body.   We identified the pylorus and measured 6 cm proximal to the pylorus and identified an area of where we would start taking down the short gastric vessels. Harmonic scalpel was used to take down the short gastric vessels along the greater curvature of the stomach. We were able to enter the lesser sac. We continued to march along the greater curvature of the stomach taking down the short gastrics. As we approached the gastrosplenic ligament we took care in this area not to injure the spleen. We were able to take down the entire gastrosplenic ligament. We then mobilized the fundus away from the left crus of diaphragm. There were a few posterior gastric avascular attachments which were taken down. This left the stomach completely mobilized. No vessels had been taken down along the lesser curvature of the stomach.  We then reidentified the pylorus. A 40Fr ViSiGi was then placed in the oropharynx and advanced down into the stomach and placed in the distal antrum and positioned along the lesser curvature. It was placed under suction which secured the 40Fr ViSiGi in place along the lesser curve. Then using the Ethicon echelon 60 mm stapler with a green load with Seamguard, I placed a stapler along the antrum approximately 5 cm from the pylorus. The stapler was angled so that there is ample room at the angularis incisura. I then fired the first staple load after inspecting it posteriorly to ensure adequate space both anteriorly and posteriorly. At this point I started using 60 mm gold load staple cartridge x1  with Seamguard. The  echelon stapler was then repositioned with a 60 mm blue load with Seamguard and we continued to march up along the [place]. My assistant was holding traction along the greater curvature stomach along the cauterized short gastric vessels ensuring that the stomach was symmetrically retracted. Prior to each firing of the staple, we rotated the stomach to ensure that there is adequate stomach left.  As we approached the fundus, I used 60 mm blue cartridge with Seamguard aiming slightly lateral to the esophageal fat pad. Although the staples on this fire had completely gone thru the last part of the stomach it had not completely cut it. Therefore 1 additional 60 blue load was used to free the remaining stomach. The sleeve was inspected. There is no evidence of cork screw. The staple line appeared hemostatic. The CRNA inflated the ViSiGi to the green zone and the upper abdomen was flooded with saline. There were no bubbles. The sleeve was decompressed and the ViSiGi removed. My assistant scrubbed out and performed an upper endoscopy. The sleeve easily distended with air and the scope was easily advanced to the pylorus. There is no evidence of internal bleeding or cork screwing. There was no narrowing at the angularis. There is no evidence of bubbles. Please see his operative note for further details. The gastric  sleeve was decompressed and the endoscope was removed.  The greater curvature the stomach was grasped with a laparoscopic grasper and removed from the 15 mm trocar site.  The liver retractor was removed. I then closed the 15 mm trocar site with 1 interrupted 0 Vicryl sutures through the fascia using the endoclose. The closure was viewed laparoscopically and it was airtight. Remaining Exparel was then infiltrated in the preperitoneal spaces around the trocar sites. Pneumoperitoneum was released. All trocar sites were closed with a 4-0 Monocryl in a subcuticular fashion followed by the application of benzoin,  steri-strips,and bandages. The patient was extubated and taken to the recovery room in stable condition. All needle, instrument, and sponge counts were correct x2. There are no immediate complications  (1) 60 mm green with Seamguard (1) 60 mm gold with seamguard (3) 60 mm blue with  seamguard  PLAN OF CARE: Admit to inpatient   PATIENT DISPOSITION:  PACU - hemodynamically stable.   Delay start of Pharmacological VTE agent (>24hrs) due to surgical blood loss or risk of bleeding:  no  Leighton Ruff. Redmond Pulling, MD, FACS FASMBS General, Bariatric, & Minimally Invasive Surgery [facility], [place]

## 2017-03-14 NOTE — Progress Notes (Signed)
Patient just arrived to floor.  Patient still groggy from anesthesia.  Family at bedside. No questions at this time.

## 2017-03-14 NOTE — Anesthesia Preprocedure Evaluation (Addendum)
Anesthesia Evaluation  Patient identified by MRN, date of birth, ID band Patient awake    Reviewed: Allergy & Precautions, NPO status , Patient's Chart, lab work & pertinent test results  History of Anesthesia Complications (+) PONV and history of anesthetic complications  Airway Mallampati: II  TM Distance: >3 FB Neck ROM: Full    Dental  (+) Teeth Intact, Dental Advisory Given   Pulmonary    breath sounds clear to auscultation       Cardiovascular negative cardio ROS   Rhythm:Regular Rate:Normal     Neuro/Psych PSYCHIATRIC DISORDERS Depression    GI/Hepatic Neg liver ROS, GERD  Medicated,  Endo/Other  negative endocrine ROS  Renal/GU negative Renal ROS     Musculoskeletal  (+) Arthritis , Osteoarthritis,    Abdominal (+) + obese,   Peds  Hematology negative hematology ROS (+)   Anesthesia Other Findings   Reproductive/Obstetrics                            Anesthesia Physical Anesthesia Plan  ASA: III  Anesthesia Plan: General   Post-op Pain Management:    Induction: Intravenous  PONV Risk Score and Plan: 4 or greater and Ondansetron, Dexamethasone, Propofol, Midazolam and Scopolamine patch - Pre-op  Airway Management Planned: Oral ETT  Additional Equipment:   Intra-op Plan:   Post-operative Plan: Extubation in OR  Informed Consent: I have reviewed the patients History and Physical, chart, labs and discussed the procedure including the risks, benefits and alternatives for the proposed anesthesia with the patient or authorized representative who has indicated his/her understanding and acceptance.   Dental advisory given  Plan Discussed with: CRNA  Anesthesia Plan Comments:         Anesthesia Quick Evaluation

## 2017-03-14 NOTE — Op Note (Signed)
Procedure: Upper GI endoscopy  Description of procedure: Upper GI endoscopy is performed at the completion of laparoscopic sleeve gastrectomy by Dr.  Redmond Pulling.  The video endoscope was introduced into the upper esophagus and then passed to the EG junction at about 40 cm. The esophagus appeared normal. The gastric sleeve was entered. The sleeve was tensely distended with air while the outlet was obstructed under saline irrigation by the operating surgeon. There was no evidence of leak. The staple line was intact and without bleeding. The scope was advanced to the antrum and pylorus visualized. There was no stricture or twisting or mucosal abnormality, and particularly no narrowing noted at the incisura.  The pouch was then desufflated and the scope withdrawn.  Edward Jolly MD, FACS  03/14/2017, 10:47 PM

## 2017-03-14 NOTE — H&P (Signed)
Courtney Bradley 03/02/2017 3:45 PM Location: [facility] Patient #: [id number] DOB: 1951/05/22 Married / Language: Cleophus Molt / Race: White Female   History of Present Illness Randall Hiss M. Julie-Ann Vanmaanen MD; 03/03/2017 10:22 AM) The patient is a 66 year old female who presents for a bariatric surgery evaluation. 03/02/2017 she comes in today for a preoperative appointment. Her chart and paperwork have been submitted for insurance review for laparoscopic sleeve gastrectomy. I initially met her May 11. Her weight at that time was 234 pounds. She denies any significant medical changes since she was initially seen. She denies any trips the emergency room or hospital. She is accompanied by her husband today. She denies again any chest pain, chest pressure, shortness of breath, orthopnea, paroxysmal nocturnal dyspnea, TIAs or amaurosis fugax. She states that she doesn't really have significant reflux. She will have occasional reflux but it is entirely food dependent  She was evaluated by the nutritionist and psychologist. She also completed her bariatric laboratory evaluation which was unremarkable except for vitamin D level of 29. Her upper GI showed a small reflux without any evidence of hiatal hernia  Review of systems-a comprehensive 12 point review of systems was performed and all systems are negative except for what is mentioned in HPI  12/30/2016 She is referred by Derrek Monaco PA (ob/gyn) for evaluation of weight loss surgery. Dr Laural Golden is her gi doctor and spoke with her about weight loss surgery as well. She completed our Neurosurgeon. She is interested in a sleeve gastrectomy because it is less intense than a gastric bypass. One of her goals is to be able to run again. She used to run quite frequently but now has knee issues. She has struggled with her weight for many years. Despite numerous attempts for sustained weight loss she's been unsuccessful. She has tried Weight Watchers  several times, LA weight loss, formula 3, hypnosis, isogenic's, and phentermine on several occasions-all without any long-term success.  Her comorbidities include dyslipidemia, gastroesophageal reflux disease, osteopenia  She denies any chest pain, chest pressure, shortness of breath, orthopnea, dyspnea on exertion, paroxysmal nocturnal dyspnea, peripheral edema. She denies any personal family history of blood clots. Her reflexes food related. She is had a laparoscopic cholecystectomy. She has daily bowel movements. She denies any melena or hematochezia. She denies any regurgitation. She had a normal colonoscopy about a month and a half ago. She is a G2 P2 with 4 grandchildren. She had a stress fracture her back several years ago which led to the diagnosis of osteopenia. She denies any TIAs or amaurosis fugax. She denies any alcohol or tobacco use. She denies any drug use. 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 Redmond Pulling, MD; 03/03/2017 10:25 AM) Foot Surgery  Bilateral. Gallbladder Surgery - Laparoscopic   Diagnostic Studies History Randall Hiss M. Redmond Pulling, MD; 03/03/2017 10:25 AM) Colonoscopy  within last year Mammogram  1-3 years ago Pap Smear  1-5 years ago  Allergies Randall Hiss M. Redmond Pulling, MD; 03/03/2017 10:25 AM) Allergies Reconciled  Penicillins   Medication History Randall Hiss M. Redmond Pulling, MD; 03/03/2017 10:25 AM) Medications Reconciled Pantoprazole Sodium (40MG Tablet DR, 1 (one) Tablet Oral daily, Taken starting 03/02/2017) Active. Zofran (4MG Tablet, 1 (one) Tablet Oral every eight  hours, as needed, Taken starting 03/02/2017) Active. OxyCODONE HCl (5MG/5ML Solution, 5-10 Milliliter Oral every four hours, as needed, Taken starting  03/02/2017) Active. Xyzal (5MG Tablet, Oral) Active. Simvastatin (20MG Tablet, Oral) Active. Sertraline HCl (50MG Tablet, Oral) Active. Naproxen (500MG Tablet, Oral) Active. Climara Pro (0.045-0.015MG/DAY Patch Weekly, Transdermal) Active. Caltrate 600 + D (600-125MG-IU Tablet, Oral) Active. Calcium Carb-Cholecalciferol (Oral) Specific strength unknown - Active.  Social History Randall Hiss M. Redmond Pulling, MD; 03/03/2017 10:25 AM) Alcohol use  Occasional alcohol use. Caffeine use  Coffee, Tea. No drug use  Tobacco use  Never smoker.  Family History Randall Hiss M. Redmond Pulling, MD; 03/03/2017 10:25 AM) Breast Cancer  Mother. Depression  Mother. Diabetes Mellitus  Mother. Heart Disease  Mother. Heart disease in female family member before age 7  Hypertension  Brother. Prostate Cancer  Father.  Pregnancy / Birth History Randall Hiss M. Redmond Pulling, MD; 03/03/2017 10:25 AM) Age at menarche  80 years. Age of menopause  51-55 Contraceptive History  Oral contraceptives. Gravida  2 Maternal age  50-25 Para  2  Other Problems Randall Hiss M. Redmond Pulling, MD; 03/03/2017 10:25 AM) Kidney Joaquim Lai  HYPERCHOLESTEROLEMIA (E78.00)  GASTROESOPHAGEAL REFLUX DISEASE, ESOPHAGITIS PRESENCE NOT SPECIFIED (K21.9)  BACK PAIN, LUMBOSACRAL (M54.5)     Review of Systems Randall Hiss M. Blima Jaimes MD; 03/03/2017 10:25 AM) Skin Not Present- Change in Wart/Mole, Dryness, Hives, Jaundice, New Lesions, Non-Healing Wounds, Rash and Ulcer. HEENT Present- Seasonal Allergies. Not Present- Earache, Hearing Loss, Hoarseness, Nose Bleed, Oral Ulcers, Ringing in the Ears, Sinus Pain, Sore Throat, Visual Disturbances, Wears glasses/contact lenses and Yellow Eyes. Respiratory Present- Snoring. Not Present- Bloody sputum, Chronic Cough, Difficulty Breathing and Wheezing. Breast Not Present- Breast Mass, Breast Pain, Nipple Discharge and Skin Changes. Cardiovascular Not Present- Chest Pain, Difficulty Breathing Lying Down, Leg Cramps, Palpitations,  Rapid Heart Rate, Shortness of Breath and Swelling of Extremities. Female Genitourinary Not Present- Frequency, Nocturia, Painful Urination, Pelvic Pain and Urgency. Musculoskeletal Present- Joint Pain. Not Present- Back Pain, Joint Stiffness, Muscle Pain, Muscle Weakness and Swelling of Extremities. Neurological Not Present- Decreased Memory, Fainting, Headaches, Numbness, Seizures, Tingling, Tremor, Trouble walking and Weakness. Psychiatric Not Present- Anxiety, Bipolar, Change in Sleep Pattern, Depression, Fearful and Frequent crying. Endocrine Present- Heat Intolerance. Not Present- Cold Intolerance, Excessive Hunger, Hair Changes, Hot flashes and New Diabetes. Hematology Not Present- Blood Thinners, Easy Bruising, Excessive bleeding, Gland problems, HIV and Persistent Infections.  Vitals Randall Hiss M. Latori Beggs MD; 03/03/2017 10:23 AM) 03/02/2017 3:52 PM Weight: 240.8 lb Height: 66in Body Surface Area: 2.16 m Body Mass Index: 38.87 kg/m  Temp.: 98.23F  Pulse: 104 (Regular)  BP: 130/92 (Sitting, Left Arm, Standard)       Physical Exam Randall Hiss M. Taeveon Keesling MD; 03/03/2017 10:23 AM) General Mental Status-Alert. General Appearance-Consistent with stated age. Hydration-Well hydrated. Voice-Normal. Note: Morbidly obese   Head and Neck Head-normocephalic, atraumatic with no lesions or palpable masses. Trachea-midline. Thyroid Gland Characteristics - normal size and consistency.  Eye Eyeball - Bilateral-Extraocular movements intact. Sclera/Conjunctiva - Bilateral-No scleral icterus.  ENMT Note: Normal external ears lips intact   Chest and Lung Exam Chest and lung exam reveals -quiet, even and easy respiratory effort with no use of accessory muscles and on auscultation, normal breath sounds, no adventitious sounds and normal vocal resonance. Inspection Chest Wall - Normal. Back - normal.  Breast - Did not examine.  Cardiovascular Cardiovascular  examination reveals -normal heart sounds, regular rate and rhythm with no murmurs and normal pedal pulses bilaterally.  Abdomen Inspection Inspection of the abdomen reveals - No Hernias.  Skin - Scar - no surgical scars. Palpation/Percussion Palpation and Percussion of the abdomen reveal - Soft, Non Tender, No Rebound tenderness, No Rigidity (guarding) and No hepatosplenomegaly. Auscultation Auscultation of the abdomen reveals - Bowel sounds normal.  Peripheral Vascular Upper Extremity Palpation - Pulses bilaterally normal.  Neurologic Neurologic evaluation reveals -alert and oriented x 3 with no impairment of recent or remote memory. Mental Status-Normal.  Neuropsychiatric The patient's mood and affect are described as -normal. Judgment and Insight-insight is appropriate concerning matters relevant to self.  Musculoskeletal Normal Exam - Left-Upper Extremity Strength Normal and Lower Extremity Strength Normal. Normal Exam - Right-Upper Extremity Strength Normal and Lower Extremity Strength Normal. Note: Bilateral knee crepitus   Lymphatic Head & Neck  General Head & Neck Lymphatics: Bilateral - Description - Normal. Axillary - Did not examine. Femoral & Inguinal - Did not examine.    Assessment & Plan Randall Hiss M. Lorriann Hansmann MD; 03/03/2017 10:25 AM) GASTROESOPHAGEAL REFLUX DISEASE, ESOPHAGITIS PRESENCE NOT SPECIFIED (K21.9) Impression: We discussed that in some patients reflux disease can worsen after sleeve gastrectomy Current Plans Started Zofran 4MG, 1 (one) Tablet every eight hours, as needed, #15, 03/02/2017, No Refill. Started Pantoprazole Sodium 40MG, 1 (one) Tablet daily, #30, 30 days starting 03/02/2017, No Refill. Started OxyCODONE HCl 5MG/5ML, 5-10 Milliliter every four hours, as needed, 100 Milliliter, 03/02/2017, No Refill. HYPERCHOLESTEROLEMIA (E78.00) OBESITY (BMI 30-39.9) (E66.9) Impression: We discussed her preoperative evaluation. Her pulses  retaken and her pulse rate was in the 80s. We discussed her labs, EKG, upper GI and chest x-ray. We rediscussed the typical hospital course as well as the recovery. We discussed the typical issues that occur after surgery. We discussed the preoperative meal plan. We discussed the postoperative dietary plan as well. She was given her postoperative prescriptions today. All of their questions were asked and answered. Current Plans Pt Education - EMW_preopbariatric BACK PAIN, LUMBOSACRAL (M54.5) OSTEOPENIA OF SPINE (M85.88)  Leighton Ruff. Redmond Pulling, MD, FACS General, Bariatric, & Minimally Invasive Surgery [facility], [place]

## 2017-03-14 NOTE — H&P (View-Only) (Signed)
Courtney Bradley 03/02/2017 3:45 PM Location: [facility] Patient #: [id number] DOB: 1951/05/22 Married / Language: Cleophus Molt / Race: White Female   History of Present Illness Randall Hiss M. Zoeie Ritter MD; 03/03/2017 10:22 AM) The patient is a 66 year old female who presents for a bariatric surgery evaluation. 03/02/2017 she comes in today for a preoperative appointment. Her chart and paperwork have been submitted for insurance review for laparoscopic sleeve gastrectomy. I initially met her May 11. Her weight at that time was 234 pounds. She denies any significant medical changes since she was initially seen. She denies any trips the emergency room or hospital. She is accompanied by her husband today. She denies again any chest pain, chest pressure, shortness of breath, orthopnea, paroxysmal nocturnal dyspnea, TIAs or amaurosis fugax. She states that she doesn't really have significant reflux. She will have occasional reflux but it is entirely food dependent  She was evaluated by the nutritionist and psychologist. She also completed her bariatric laboratory evaluation which was unremarkable except for vitamin D level of 29. Her upper GI showed a small reflux without any evidence of hiatal hernia  Review of systems-a comprehensive 12 point review of systems was performed and all systems are negative except for what is mentioned in HPI  12/30/2016 She is referred by Derrek Monaco PA (ob/gyn) for evaluation of weight loss surgery. Dr Laural Golden is her gi doctor and spoke with her about weight loss surgery as well. She completed our Neurosurgeon. She is interested in a sleeve gastrectomy because it is less intense than a gastric bypass. One of her goals is to be able to run again. She used to run quite frequently but now has knee issues. She has struggled with her weight for many years. Despite numerous attempts for sustained weight loss she's been unsuccessful. She has tried Weight Watchers  several times, LA weight loss, formula 3, hypnosis, isogenic's, and phentermine on several occasions-all without any long-term success.  Her comorbidities include dyslipidemia, gastroesophageal reflux disease, osteopenia  She denies any chest pain, chest pressure, shortness of breath, orthopnea, dyspnea on exertion, paroxysmal nocturnal dyspnea, peripheral edema. She denies any personal family history of blood clots. Her reflexes food related. She is had a laparoscopic cholecystectomy. She has daily bowel movements. She denies any melena or hematochezia. She denies any regurgitation. She had a normal colonoscopy about a month and a half ago. She is a G2 P2 with 4 grandchildren. She had a stress fracture her back several years ago which led to the diagnosis of osteopenia. She denies any TIAs or amaurosis fugax. She denies any alcohol or tobacco use. She denies any drug use. 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 Redmond Pulling, MD; 03/03/2017 10:25 AM) Foot Surgery  Bilateral. Gallbladder Surgery - Laparoscopic   Diagnostic Studies History Randall Hiss M. Redmond Pulling, MD; 03/03/2017 10:25 AM) Colonoscopy  within last year Mammogram  1-3 years ago Pap Smear  1-5 years ago  Allergies Randall Hiss M. Redmond Pulling, MD; 03/03/2017 10:25 AM) Allergies Reconciled  Penicillins   Medication History Randall Hiss M. Redmond Pulling, MD; 03/03/2017 10:25 AM) Medications Reconciled Pantoprazole Sodium (40MG Tablet DR, 1 (one) Tablet Oral daily, Taken starting 03/02/2017) Active. Zofran (4MG Tablet, 1 (one) Tablet Oral every eight  hours, as needed, Taken starting 03/02/2017) Active. OxyCODONE HCl (5MG/5ML Solution, 5-10 Milliliter Oral every four hours, as needed, Taken starting  03/02/2017) Active. Xyzal (5MG Tablet, Oral) Active. Simvastatin (20MG Tablet, Oral) Active. Sertraline HCl (50MG Tablet, Oral) Active. Naproxen (500MG Tablet, Oral) Active. Climara Pro (0.045-0.015MG/DAY Patch Weekly, Transdermal) Active. Caltrate 600 + D (600-125MG-IU Tablet, Oral) Active. Calcium Carb-Cholecalciferol (Oral) Specific strength unknown - Active.  Social History Randall Hiss M. Redmond Pulling, MD; 03/03/2017 10:25 AM) Alcohol use  Occasional alcohol use. Caffeine use  Coffee, Tea. No drug use  Tobacco use  Never smoker.  Family History Randall Hiss M. Redmond Pulling, MD; 03/03/2017 10:25 AM) Breast Cancer  Mother. Depression  Mother. Diabetes Mellitus  Mother. Heart Disease  Mother. Heart disease in female family member before age 7  Hypertension  Brother. Prostate Cancer  Father.  Pregnancy / Birth History Randall Hiss M. Redmond Pulling, MD; 03/03/2017 10:25 AM) Age at menarche  80 years. Age of menopause  51-55 Contraceptive History  Oral contraceptives. Gravida  2 Maternal age  50-25 Para  2  Other Problems Randall Hiss M. Redmond Pulling, MD; 03/03/2017 10:25 AM) Kidney Joaquim Lai  HYPERCHOLESTEROLEMIA (E78.00)  GASTROESOPHAGEAL REFLUX DISEASE, ESOPHAGITIS PRESENCE NOT SPECIFIED (K21.9)  BACK PAIN, LUMBOSACRAL (M54.5)     Review of Systems Randall Hiss M. Zeyna Mkrtchyan MD; 03/03/2017 10:25 AM) Skin Not Present- Change in Wart/Mole, Dryness, Hives, Jaundice, New Lesions, Non-Healing Wounds, Rash and Ulcer. HEENT Present- Seasonal Allergies. Not Present- Earache, Hearing Loss, Hoarseness, Nose Bleed, Oral Ulcers, Ringing in the Ears, Sinus Pain, Sore Throat, Visual Disturbances, Wears glasses/contact lenses and Yellow Eyes. Respiratory Present- Snoring. Not Present- Bloody sputum, Chronic Cough, Difficulty Breathing and Wheezing. Breast Not Present- Breast Mass, Breast Pain, Nipple Discharge and Skin Changes. Cardiovascular Not Present- Chest Pain, Difficulty Breathing Lying Down, Leg Cramps, Palpitations,  Rapid Heart Rate, Shortness of Breath and Swelling of Extremities. Female Genitourinary Not Present- Frequency, Nocturia, Painful Urination, Pelvic Pain and Urgency. Musculoskeletal Present- Joint Pain. Not Present- Back Pain, Joint Stiffness, Muscle Pain, Muscle Weakness and Swelling of Extremities. Neurological Not Present- Decreased Memory, Fainting, Headaches, Numbness, Seizures, Tingling, Tremor, Trouble walking and Weakness. Psychiatric Not Present- Anxiety, Bipolar, Change in Sleep Pattern, Depression, Fearful and Frequent crying. Endocrine Present- Heat Intolerance. Not Present- Cold Intolerance, Excessive Hunger, Hair Changes, Hot flashes and New Diabetes. Hematology Not Present- Blood Thinners, Easy Bruising, Excessive bleeding, Gland problems, HIV and Persistent Infections.  Vitals Randall Hiss M. Marquia Costello MD; 03/03/2017 10:23 AM) 03/02/2017 3:52 PM Weight: 240.8 lb Height: 66in Body Surface Area: 2.16 m Body Mass Index: 38.87 kg/m  Temp.: 98.23F  Pulse: 104 (Regular)  BP: 130/92 (Sitting, Left Arm, Standard)       Physical Exam Randall Hiss M. Doral Digangi MD; 03/03/2017 10:23 AM) General Mental Status-Alert. General Appearance-Consistent with stated age. Hydration-Well hydrated. Voice-Normal. Note: Morbidly obese   Head and Neck Head-normocephalic, atraumatic with no lesions or palpable masses. Trachea-midline. Thyroid Gland Characteristics - normal size and consistency.  Eye Eyeball - Bilateral-Extraocular movements intact. Sclera/Conjunctiva - Bilateral-No scleral icterus.  ENMT Note: Normal external ears lips intact   Chest and Lung Exam Chest and lung exam reveals -quiet, even and easy respiratory effort with no use of accessory muscles and on auscultation, normal breath sounds, no adventitious sounds and normal vocal resonance. Inspection Chest Wall - Normal. Back - normal.  Breast - Did not examine.  Cardiovascular Cardiovascular  examination reveals -normal heart sounds, regular rate and rhythm with no murmurs and normal pedal pulses bilaterally.  Abdomen Inspection Inspection of the abdomen reveals - No Hernias.  Skin - Scar - no surgical scars. Palpation/Percussion Palpation and Percussion of the abdomen reveal - Soft, Non Tender, No Rebound tenderness, No Rigidity (guarding) and No hepatosplenomegaly. Auscultation Auscultation of the abdomen reveals - Bowel sounds normal.  Peripheral Vascular Upper Extremity Palpation - Pulses bilaterally normal.  Neurologic Neurologic evaluation reveals -alert and oriented x 3 with no impairment of recent or remote memory. Mental Status-Normal.  Neuropsychiatric The patient's mood and affect are described as -normal. Judgment and Insight-insight is appropriate concerning matters relevant to self.  Musculoskeletal Normal Exam - Left-Upper Extremity Strength Normal and Lower Extremity Strength Normal. Normal Exam - Right-Upper Extremity Strength Normal and Lower Extremity Strength Normal. Note: Bilateral knee crepitus   Lymphatic Head & Neck  General Head & Neck Lymphatics: Bilateral - Description - Normal. Axillary - Did not examine. Femoral & Inguinal - Did not examine.    Assessment & Plan Randall Hiss M. Shiree Altemus MD; 03/03/2017 10:25 AM) GASTROESOPHAGEAL REFLUX DISEASE, ESOPHAGITIS PRESENCE NOT SPECIFIED (K21.9) Impression: We discussed that in some patients reflux disease can worsen after sleeve gastrectomy Current Plans Started Zofran 4MG, 1 (one) Tablet every eight hours, as needed, #15, 03/02/2017, No Refill. Started Pantoprazole Sodium 40MG, 1 (one) Tablet daily, #30, 30 days starting 03/02/2017, No Refill. Started OxyCODONE HCl 5MG/5ML, 5-10 Milliliter every four hours, as needed, 100 Milliliter, 03/02/2017, No Refill. HYPERCHOLESTEROLEMIA (E78.00) OBESITY (BMI 30-39.9) (E66.9) Impression: We discussed her preoperative evaluation. Her pulses  retaken and her pulse rate was in the 80s. We discussed her labs, EKG, upper GI and chest x-ray. We rediscussed the typical hospital course as well as the recovery. We discussed the typical issues that occur after surgery. We discussed the preoperative meal plan. We discussed the postoperative dietary plan as well. She was given her postoperative prescriptions today. All of their questions were asked and answered. Current Plans Pt Education - EMW_preopbariatric BACK PAIN, LUMBOSACRAL (M54.5) OSTEOPENIA OF SPINE (M85.88)  Leighton Ruff. Redmond Pulling, MD, FACS General, Bariatric, & Minimally Invasive Surgery [facility], [place]

## 2017-03-15 ENCOUNTER — Encounter (HOSPITAL_COMMUNITY): Payer: Self-pay | Admitting: General Surgery

## 2017-03-15 DIAGNOSIS — K219 Gastro-esophageal reflux disease without esophagitis: Secondary | ICD-10-CM | POA: Diagnosis not present

## 2017-03-15 DIAGNOSIS — M545 Low back pain: Secondary | ICD-10-CM | POA: Diagnosis not present

## 2017-03-15 DIAGNOSIS — K449 Diaphragmatic hernia without obstruction or gangrene: Secondary | ICD-10-CM | POA: Diagnosis not present

## 2017-03-15 DIAGNOSIS — E7800 Pure hypercholesterolemia, unspecified: Secondary | ICD-10-CM | POA: Diagnosis not present

## 2017-03-15 DIAGNOSIS — M8580 Other specified disorders of bone density and structure, unspecified site: Secondary | ICD-10-CM | POA: Diagnosis not present

## 2017-03-15 DIAGNOSIS — Z880 Allergy status to penicillin: Secondary | ICD-10-CM | POA: Diagnosis not present

## 2017-03-15 DIAGNOSIS — Z6838 Body mass index (BMI) 38.0-38.9, adult: Secondary | ICD-10-CM | POA: Diagnosis not present

## 2017-03-15 DIAGNOSIS — Z23 Encounter for immunization: Secondary | ICD-10-CM | POA: Diagnosis not present

## 2017-03-15 LAB — COMPREHENSIVE METABOLIC PANEL
ALK PHOS: 103 U/L (ref 38–126)
ALT: 74 U/L — AB (ref 14–54)
AST: 66 U/L — AB (ref 15–41)
Albumin: 3.5 g/dL (ref 3.5–5.0)
Anion gap: 6 (ref 5–15)
BILIRUBIN TOTAL: 0.5 mg/dL (ref 0.3–1.2)
BUN: 10 mg/dL (ref 6–20)
CO2: 26 mmol/L (ref 22–32)
CREATININE: 0.87 mg/dL (ref 0.44–1.00)
Calcium: 9.3 mg/dL (ref 8.9–10.3)
Chloride: 109 mmol/L (ref 101–111)
GFR calc Af Amer: >60 mL/min (ref >60)
GLUCOSE: 128 mg/dL — AB (ref 65–99)
Potassium: 4.8 mmol/L (ref 3.5–5.1)
Sodium: 141 mmol/L (ref 135–145)
TOTAL PROTEIN: 7.1 g/dL (ref 6.5–8.1)

## 2017-03-15 LAB — CBC WITH DIFFERENTIAL/PLATELET
BASOS ABS: 0 10*3/uL (ref 0.0–0.1)
Basophils Relative: 0 %
Eosinophils Absolute: 0 10*3/uL (ref 0.0–0.7)
Eosinophils Relative: 0 %
HEMATOCRIT: 39.4 % (ref 36.0–46.0)
HEMOGLOBIN: 12.7 g/dL (ref 12.0–15.0)
LYMPHS PCT: 11 %
Lymphs Abs: 1.5 10*3/uL (ref 0.7–4.0)
MCH: 24.7 pg — ABNORMAL LOW (ref 26.0–34.0)
MCHC: 32.2 g/dL (ref 30.0–36.0)
MCV: 76.5 fL — AB (ref 78.0–100.0)
MONO ABS: 0.9 10*3/uL (ref 0.1–1.0)
Monocytes Relative: 7 %
NEUTROS ABS: 11.4 10*3/uL — AB (ref 1.7–7.7)
NEUTROS PCT: 82 %
Platelets: 295 10*3/uL (ref 150–400)
RBC: 5.15 MIL/uL — AB (ref 3.87–5.11)
RDW: 14.4 % (ref 11.5–15.5)
WBC: 13.8 10*3/uL — AB (ref 4.0–10.5)

## 2017-03-15 MED ORDER — PROMETHAZINE HCL 25 MG/ML IJ SOLN: 12.5000 mg | Freq: Four times a day (QID) | INTRAMUSCULAR | Status: DC | PRN

## 2017-03-15 MED ORDER — OXYCODONE HCL 5 MG/5ML PO SOLN: 5.0000 mg | ORAL | 0 refills | Status: DC | PRN

## 2017-03-15 NOTE — Progress Notes (Signed)
Pt was discharged home today. Instructions were reviewed with patient, and questions were answered. Pt was taken to main entrance via wheelchair by NT.  

## 2017-03-15 NOTE — Discharge Instructions (Signed)
° ° ° °GASTRIC BYPASS/SLEEVE ° Home Care Instructions ° ° These instructions are to help you care for yourself when you go home. ° °Call: If you have any problems. °• Call [phone] and ask for the surgeon on call °• If you need immediate assistance come to the ER at [facility]. Tell the ER staff you are a new post-op gastric bypass or gastric sleeve patient  °Signs and symptoms to report: • Severe  vomiting or nausea °o If you cannot handle clear liquids for longer than 1 day, call your surgeon °• Abdominal pain which does not get better after taking your pain medication °• Fever greater than 100.4°  F and chills °• Heart rate over 100 beats a minute °• Trouble breathing °• Chest pain °• Redness,  swelling, drainage, or foul odor at incision (surgical) sites °• If your incisions open or pull apart °• Swelling or pain in calf (lower leg) °• Diarrhea (Loose bowel movements that happen often), frequent watery, uncontrolled bowel movements °• Constipation, (no bowel movements for 3 days) if this happens: °o Take Milk of Magnesia, 2 tablespoons by mouth, 3 times a day for 2 days if needed °o Stop taking Milk of Magnesia once you have had a bowel movement °o Call your doctor if constipation continues °Or °o Take Miralax  (instead of Milk of Magnesia) following the label instructions °o Stop taking Miralax once you have had a bowel movement °o Call your doctor if constipation continues °• Anything you think is “abnormal for you” °  °Normal side effects after surgery: • Unable to sleep at night or unable to concentrate °• Irritability °• Being tearful (crying) or depressed ° °These are common complaints, possibly related to your anesthesia, stress of surgery, and change in lifestyle, that usually go away a few weeks after surgery. If these feelings continue, call your medical doctor.  °Wound Care: You may have surgical glue, steri-strips, or staples over your incisions after surgery °• Surgical glue: Looks like clear  film over your incisions and will wear off a little at a time °• Steri-strips: Adhesive strips of tape over your incisions. You may notice a yellowish color on skin under the steri-strips. This is used to make the steri-strips stick better. Do not pull the steri-strips off - let them fall off °• Staples: Staples may be removed before you leave the hospital °o If you go home with staples, call [facility] for an appointment with your surgeon’s nurse to have staples removed 10 days after surgery, [phone] °• Showering: You may shower two (2) days after your surgery unless your surgeon tells you differently °o Wash gently around incisions with warm soapy water, rinse well, and gently pat dry °o If you have a drain (tube from your incision), you may need someone to hold this while you shower °o No tub baths until staples are removed and incisions are healed °  °Medications: • Medications should be liquid or crushed if larger than the size of a dime °• Extended release pills (medication that releases a little bit at a time through the  day) should not be crushed °• Depending on the size and number of medications you take, you may need to space (take a few throughout the day)/change the time you take your medications so that you do not over-fill your pouch (smaller stomach) °• Make sure you follow-up with you primary care physician to make medication changes needed during rapid weight loss and life -style changes °•   If you have diabetes, follow up with your doctor that orders your diabetes medication(s) within one week after surgery and check your blood sugar regularly ° °• Do not drive while taking narcotics (pain medications) ° °• Do not take acetaminophen (Tylenol) and Roxicet or Lortab Elixir at the same time since these pain medications contain acetaminophen °  °Diet:  °First 2 Weeks You will see the nutritionist about two (2) weeks after your surgery. The nutritionist will increase the types of  foods you can eat if you are handling liquids well: °• If you have severe vomiting or nausea and cannot handle clear liquids lasting longer than 1 day call your surgeon °Protein Shake °• Drink at least 2 ounces of shake 5-6 times per day °• Each serving of protein shakes (usually 8-12 ounces) should have a minimum of: °o 15 grams of protein °o And no more than 5 grams of carbohydrate °• Goal for protein each day: °o Men = 80 grams per day °o Women = 60 grams per day °  ° • Protein powder may be added to fluids such as non-fat milk or Lactaid milk or Soy milk (limit to 35 grams added protein powder per serving) ° °Hydration °• Slowly increase the amount of water and other clear liquids as tolerated (See Acceptable Fluids) °• Slowly increase the amount of protein shake as tolerated °• Sip fluids slowly and throughout the day °• May use sugar substitutes in small amounts (no more than 6-8 packets per day; i.e. Splenda) ° °Fluid Goal °• The first goal is to drink at least 8 ounces of protein shake/drink per day (or as directed by the nutritionist); some examples of protein shakes are Syntrax Nectar, Adkins Advantage, EAS Edge HP, and Unjury. - See handout from pre-op Bariatric Education Class: °o Slowly increase the amount of protein shake you drink as tolerated °o You may find it easier to slowly sip shakes throughout the day °o It is important to get your proteins in first °• Your fluid goal is to drink 64-100 ounces of fluid daily °o It may take a few weeks to build up to this  °• 32 oz. (or more) should be clear liquids °And °• 32 oz. (or more) should be full liquids (see below for examples) °• Liquids should not contain sugar, caffeine, or carbonation ° °Clear Liquids: °• Water of Sugar-free flavored water (i.e. Fruit H²O, Propel) °• Decaffeinated coffee or tea (sugar-free) °• Crystal lite, Wyler’s Lite, Minute Maid Lite °• Sugar-free Jell-O °• Bouillon or broth °• Sugar-free Popsicle:    - Less than 20 calories  each; Limit 1 per day ° °Full Liquids: °                  Protein Shakes/Drinks + 2 choices per day of other full liquids °• Full liquids must be: °o No More Than 12 grams of Carbs per serving °o No More Than 3 grams of Fat per serving °• Strained low-fat cream soup °• Non-Fat milk °• Fat-free Lactaid Milk °• Sugar-free yogurt (Dannon Lite & Fit, Greek yogurt) ° °  °Vitamins and Minerals • Start 1 day after surgery unless otherwise directed by your surgeon °• 2 Chewable Multivitamin / Multimineral Supplement with iron  °• Chewable Calcium Citrate with Vitamin D-3 °(Example: 3 Chewable Calcium  Plus 600 with Vitamin D-3) °o Take 500 mg three (3) times a day for a total of 1500 mg each day °o Do not take all 3 doses of calcium   at one time as it may cause constipation, and you can only absorb 500 mg at a time o Do not mix multivitamins containing iron with calcium supplements;  take 2 hours apart o Do not substitute Tums (calcium carbonate) for your calcium  Menstruating women and those at risk for anemia ( a blood disease that causes weakness) may need extra iron o Talk to your doctor to see if you need more iron  If you need extra iron: Total daily Iron recommendation (including Vitamins) is 50 to 100 mg Iron/day  Do not stop taking or change any vitamins or minerals until you talk to your nutritionist or surgeon  Your nutritionist and/or surgeon must approve all vitamin and mineral supplements   Activity and Exercise: It is important to continue walking at home. Limit your physical activity as instructed by your doctor. During this time, use these guidelines:  Do not lift anything greater than ten  (10) pounds for at least two (2) weeks  Do not go back to work or drive until Engineer, production says you can  You may have sex when you feel comfortable o It is VERY important for female patients to use a reliable birth control method; fertility often increase after surgery o Do not get pregnant for at  least 18 months  Start exercising as soon as your doctor tells you that you can o Make sure your doctor approves any physical activity  Start with a simple walking program  Walk 5-15 minutes each day, 7 days per week  Slowly increase until you are walking 30-45 minutes per day  Consider joining our [place] program. [phone] or email [email or web address]   Special Instructions Things to remember:  Use your CPAP when sleeping if this applies to you  Consider buying a medical alert bracelet that says you had lap-band surgery     You will likely have your first fill (fluid added to your band) 6 - 8 weeks after surgery  [facility] has a free Bariatric Surgery Support Group that meets monthly, the 3rd Thursday, [place]. You can see classes online at [email or web address]  It is very important to keep all follow up appointments with your surgeon, nutritionist, primary care physician, and behavioral health practitioner o After the first year, please follow up with your bariatric surgeon and nutritionist at least once a year in order to maintain best weight loss results                    [facility]:  [place]: [phone]               Bariatric Nurse Coordinator: [phone]  Gastric Bypass/Sleeve Home Care Instructions  Rev. 09/2012                                                         Reviewed and Vilinda Boehringer  by [facility] Patient Education Committee, Jan, 2014 ° ° ° ° ° ° ° ° ° °

## 2017-03-15 NOTE — Care Management Note (Signed)
Case Management Note  Patient Details  Name: Courtney Bradley MRN: [id number] Date of Birth: 1951/07/08  Subjective/Objective:66 y/o f admitted w/morbid obesity. From home.                    Action/Plan:d/c home.   Expected Discharge Date:  03/15/17               Expected Discharge Plan:  Home/Self Care  In-House Referral:     Discharge planning Services  CM Consult  Post Acute Care Choice:    Choice offered to:     DME Arranged:    DME Agency:     HH Arranged:    HH Agency:     Status of Service:  Completed, signed off  If discussed at [employer] of Stay Meetings, dates discussed:    Additional Comments:  Dessa Phi, RN 03/15/2017, 9:38 AM

## 2017-03-15 NOTE — Progress Notes (Signed)
Nutrition Education Note  Received consult for diet education per DROP protocol.   Discussed 2 week post op diet with pt. Emphasized that liquids must be non carbonated, non caffeinated, and sugar free. Fluid goals discussed. Pt to follow up with outpatient bariatric RD for further diet progression after 2 weeks. Multivitamins and minerals also reviewed. Teach back method used, pt expressed understanding, expect good compliance.  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 Pt reports that she is feeling good today and has been up ambulating around. Pt has been able to tolerate liquids and was starting on her first 2oz of protein at time of RD visit. Pt has chosen Adkins protein shakes for her protein at home. Pt has planned to pick up her vitamins today. Pt has an appointment already scheduled with a Dietitian in 2 weeks.   Diet: First 2 Weeks  You will see the nutritionist about two (2) weeks after your surgery. The nutritionist will increase the types of foods you can eat if you are handling liquids well:  If you have severe vomiting or nausea and cannot handle clear liquids lasting longer than 1 day, call your surgeon  Protein Shake  Drink at least 2 ounces of shake 5-6 times per day  Each serving of protein shakes (usually 8 - 12 ounces) should have a minimum of:  15 grams of protein  And no more than 5 grams of carbohydrate  Goal for protein each day:  Men = 80 grams per day  Women = 60 grams per day  Protein powder may be added to fluids such as non-fat milk or Lactaid milk or Soy milk (limit to 35 grams added protein powder per serving)   Hydration  Slowly increase the amount of water and other clear liquids as tolerated (See Acceptable Fluids)  Slowly increase the amount of protein shake as tolerated  Sip fluids slowly and throughout the day  May use sugar substitutes in small amounts (no more than 6 - 8 packets per day; i.e.  Splenda)   Fluid Goal  The first goal is to drink at least 8 ounces of protein shake/drink per day (or as directed by the nutritionist); some examples of protein shakes are Premier Protein, [employer], [employer], EAS Edge HP, and Unjury. See handout from pre-op Bariatric Education Class:  Slowly increase the amount of protein shake you drink as tolerated  You may find it easier to slowly sip shakes throughout the day  It is important to get your proteins in first  Your fluid goal is to drink 64 - 100 ounces of fluid daily  It may take a few weeks to build up to this  32 oz (or more) should be clear liquids  And  32 oz (or more) should be full liquids (see below for examples)  Liquids should not contain sugar, caffeine, or carbonation   Clear Liquids:  Water or Sugar-free flavored water (i.e. Fruit H2O, Propel)  Decaffeinated coffee or tea (sugar-free)  Crystal Lite, Wyler's Lite, Minute Maid Lite  Sugar-free Jell-O  Bouillon or broth  Sugar-free Popsicle: *Less than 20 calories each; Limit 1 per day   Full Liquids:  Protein Shakes/Drinks + 2 choices per day of other full liquids  Full liquids must be:  No More Than 12 grams of Carbs per serving  No More Than 3 grams of Fat per serving  Strained low-fat cream soup  Non-Fat milk  Fat-free Lactaid  Milk  Sugar-free yogurt (Dannon Lite & Fit, [place] yogurt, Oikos Zero)   Koleen Distance MS, RD, LDN Pager #[phone] After Hours Pager: [phone]

## 2017-03-15 NOTE — Progress Notes (Signed)
Patient alert and oriented, Post op day 1.  Provided support and encouragement.  Encouraged pulmonary toilet, ambulation and small sips of liquids.  Completed 12 ounces of clear fluid, started protein shakes.  All questions answered.  Will continue to monitor. 

## 2017-03-15 NOTE — Anesthesia Postprocedure Evaluation (Signed)
Anesthesia Post Note  Patient: [name]  Procedure(s) Performed: Procedure(s) (LRB): LAPAROSCOPIC GASTRIC SLEEVE RESECTION WITH HIATAL HERNIA REPAIR (N/A) UPPER GI ENDOSCOPY     Patient location during evaluation: Other Anesthesia Type: General Level of consciousness: awake and alert Pain management: pain level controlled Vital Signs Assessment: post-procedure vital signs reviewed and stable Respiratory status: spontaneous breathing, nonlabored ventilation, respiratory function stable and patient connected to nasal cannula oxygen Cardiovascular status: blood pressure returned to baseline and stable Postop Assessment: no signs of nausea or vomiting Anesthetic complications: no    Last Vitals:  Vitals:   03/15/17 0140 03/15/17 0600  BP: 131/65 111/63  Pulse: 67 60  Resp: 18 18  Temp: 37.3 C 36.9 C    Last Pain:  Vitals:   03/15/17 0600  TempSrc: Oral  PainSc:    Pain Goal: Patients Stated Pain Goal: 3 (03/14/17 1014)               [name]

## 2017-03-16 NOTE — Discharge Summary (Signed)
Physician Discharge Summary  Courtney Bradley VEH:[id number] DOB: 04-10-1951 DOA: 03/14/2017  PCP: Sinda Du, MD  Admit date: 03/14/2017 Discharge date: 03/15/2017  Recommendations for Outpatient Follow-up:   Follow-up Information    Greer Pickerel, MD. Go on 04/12/2017.   Specialty:  General Surgery Why:  at [address] information: [address] [place] [address] [phone]        Greer Pickerel, MD Follow up.   Specialty:  General Surgery Contact information: [place]  [address] [phone]          Discharge Diagnoses:  Principal Problem:   Morbid obesity ([place]) Active Problems:   Osteopenia   Dyslipidemia   GERD (gastroesophageal reflux disease)  hiatal hernia  Surgical Procedure: Laparoscopic Sleeve Gastrectomy with hiatal hernia repair, upper endoscopy  Discharge Condition: Good Disposition: Home  Diet recommendation: Postoperative sleeve gastrectomy diet (liquids only)  Filed Weights   03/14/17 0914  Weight: 107.1 kg (236 lb 2 oz)     Hospital Course:  The patient was admitted for a planned laparoscopic sleeve gastrectomy. Please see operative note.She was found to have an incidental hiatal hernia which was repaired. Preoperatively the patient was given 5000 units of subcutaneous heparin for DVT prophylaxis. Postoperative prophylactic Lovenox dosing was started on the morning of postoperative day 1. On the evening of postoperative day 0, the patient was started on water and ice chips. On postoperative day 1 the patient had no fever or tachycardia and was tolerating water in their diet was gradually advanced throughout the day. The patient was ambulating without difficulty. Their vital signs are stable without fever or tachycardia. She had no significant nausea and no significant abdominal pain. Their hemoglobin had remained stable.  The patient had received discharge instructions and counseling. They were deemed  stable for discharge and had met discharge criteria. She met discharge criteria with respect to oral intake along with other metrics  BP (!) 158/68 (BP Location: Left Arm)   Pulse 63   Temp 98.2 F (36.8 C) (Oral)   Resp 17   Ht 5' 6.25" (1.683 m)   Wt 107.1 kg (236 lb 2 oz)   SpO2 100%   BMI 37.82 kg/m   Gen: alert, NAD, non-toxic appearing Pupils: equal, no scleral icterus Pulm: Lungs clear to auscultation, symmetric chest rise CV: regular rate and rhythm Abd: soft, min tender, nondistended. Incisions c/d/i. No cellulitis. No incisional hernia Ext: no edema, no calf tenderness Skin: no rash, no jaundice    Discharge Instructions  Discharge Instructions    Ambulate hourly while awake    Complete by:  As directed    Call MD for:  difficulty breathing, headache or visual disturbances    Complete by:  As directed    Call MD for:  persistant dizziness or light-headedness    Complete by:  As directed    Call MD for:  persistant nausea and vomiting    Complete by:  As directed    Call MD for:  redness, tenderness, or signs of infection (pain, swelling, redness, odor or green/yellow discharge around incision site)    Complete by:  As directed    Call MD for:  severe uncontrolled pain    Complete by:  As directed    Call MD for:  temperature >101 F    Complete by:  As directed    Diet bariatric full liquid    Complete by:  As directed    Discharge instructions  Complete by:  As directed    See bariatric discharge instructions   Incentive spirometry    Complete by:  As directed    Perform hourly while awake     Allergies as of 03/15/2017      Reactions   Penicillins Swelling   Has patient had a PCN reaction causing immediate rash, facial/tongue/throat swelling, SOB or lightheadedness with hypotension: Yes Has patient had a PCN reaction causing severe rash involving mucus membranes or skin necrosis: No Has patient had a PCN reaction that required hospitalization:  No Has patient had a PCN reaction occurring within the last 10 years: Yes If all of the above answers are "NO", then may proceed with Cephalosporin use.      Medication List    STOP taking these medications   ibuprofen 200 MG tablet Commonly known as:  ADVIL,MOTRIN   naproxen 500 MG tablet Commonly known as:  NAPROSYN     TAKE these medications   CALCIUM 600 PO Take 1 tablet by mouth every evening.   estradiol-levonorgestrel 0.045-0.015 MG/DAY Commonly known as:  CLIMARA PRO Place 1 patch onto the skin once a week. What changed:  when to take this   levocetirizine 5 MG tablet Commonly known as:  XYZAL Take 5 mg by mouth daily as needed for allergies.   multivitamin with minerals tablet Take 1 tablet by mouth daily.   oxyCODONE 5 MG/5ML solution Commonly known as:  ROXICODONE Take 5-10 mLs (5-10 mg total) by mouth every 4 (four) hours as needed for moderate pain or severe pain.   pantoprazole 40 MG tablet Commonly known as:  PROTONIX TAKE 1 TABLET BY MOUTH ONCE DAILY What changed:  See the new instructions.   sertraline 50 MG tablet Commonly known as:  ZOLOFT Take 50 mg by mouth at bedtime.   simvastatin 20 MG tablet Commonly known as:  ZOCOR Take 20 mg by mouth every evening.      Follow-up Information    Greer Pickerel, MD. Go on 04/12/2017.   Specialty:  General Surgery Why:  at [address] information: [address] [place] [address] [phone]        Greer Pickerel, MD Follow up.   Specialty:  General Surgery Contact information: [place] [address] [phone]            The results of significant diagnostics from this hospitalization (including imaging, microbiology, ancillary and laboratory) are listed below for reference.    Significant Diagnostic Studies: Dg Bone Density (dxa)  Result Date: 03/02/2017 EXAM: DUAL X-RAY ABSORPTIOMETRY (DXA) FOR BONE MINERAL DENSITY IMPRESSION: Ordering  Physician: Estill Dooms NP, Your patient Courtney Bradley completed a BMD test on 03/02/2017 using the [place] (software version: 14.10) manufactured by [employer]. The following summarizes the results of our evaluation. PATIENT BIOGRAPHICAL: Name: Courtney Bradley, Courtney Bradley Patient ID: [id number] Birth Date: 10-14-1950 Height: 66.0 in. Gender: Female Exam Date: 03/02/2017 Weight: 247.0 lbs. Indications: Caucasian, Follow up Osteopenia, Height Loss, History of Fracture (Adult), Low Calcium Intake, Parent Hip Fracture, Post Menopausal Fractures: Sacrum Treatments: Cl[place]ara patch, Multivitamin DENSITOMETRY RESULTS: Site         Region     Measured Date Measured Age WHO Classification Young Adult T-score BMD         %Change vs. Previous Significant Change (*) DualFemur Neck Right 03/02/2017 65.1 Osteopenia -1.4 0.847 g/cm 3.8% - DualFemur Neck Right 01/26/2011 59.0 Osteopenia -1.6 0.816 g/cm2 - -  Left Forearm Radius 33% 03/02/2017 65.1 Normal -0.6 0.674 g/cm2 - - ASSESSMENT: BMD as determined from Femur Neck Right is 0.847 g/cm2 with a T-Score of -1.4. This patient is considered OSTEOPENIC according to [facility]) criteria. Compared with the prior study on 01/26/2011, the BMD of the right femoral neck shows no statistically significant change. Lumbar spine was not utilized due to advanced degenerative changes. Patient is not a candidate for FRAX assessment due to patient on hormone replacement therapy.) World Health Organization [facility]) criteria for post-menopausal, Caucasian Women: Normal:       T-score at or above -1 SD Osteopenia:   T-score between -1 and -2.5 SD Osteoporosis: T-score at or below -2.5 SD RECOMMENDATIONS: [place] recommends that FDA-approved medial therapies be considered in postmenopausal women and men age 37 or older with a: 1. Hip or vertebral (clinical or morphometric) fracture. 2. T-Score of < -2.5 at the spine or hip. 3.  Ten-year fracture probability by FRAX of 3% or greater for hip fracture or 20% or greater for major osteoporotic fracture. All treatment decisions require clinical judgment and consideration of individual patient factors, including patient preferences, co-morbidities, previous drug use, risk factors not captured in the FRAX model (e.g. falls, vitamin D deficiency, increased bone turnover, interval significant decline in bone density) and possible under-or over-estimation of fracture risk by FRAX. All patients should ensure an adequate intake of dietary calcium (1200 mg/d) and vitamin D (800 IU daily) unless contraindicated. FOLLOW-UP: People with diagnosed cases of osteoporosis or osteopenia should be regularly tested for bone mineral density. For patients eligible for Medicare, routine testing is allowed once every 2 years. Testing frequency can be increased for patients who have rapidly progressing disease, or for those who are receiving medical therapy to restore bone mass. I have reviewed this report, and agree with the above findings. Mark A. Thornton Papas, M.D. [facility] Electronically Signed   By: Lavonia Dana M.D.   On: 03/02/2017 09:24    Labs: Basic Metabolic Panel:  Recent Labs Lab 03/14/17 1443 03/15/17 0516  NA 139 141  K 4.1 4.8  CL 107 109  CO2 26 26  GLUCOSE 139* 128*  BUN 15 10  CREATININE 0.92 0.87  CALCIUM 8.9 9.3   Liver Function Tests:  Recent Labs Lab 03/14/17 1443 03/15/17 0516  AST 58* 66*  ALT 61* 74*  ALKPHOS 104 103  BILITOT 0.4 0.5  PROT 6.8 7.1  ALBUMIN 3.5 3.5    CBC:  Recent Labs Lab 03/09/17 1331 03/14/17 1443 03/15/17 0516  WBC 9.4  --  13.8*  NEUTROABS  --   --  11.4*  HGB 13.5 12.3 12.7  HCT 41.3 38.4 39.4  MCV 77.3*  --  76.5*  PLT 359  --  295    CBG: No results for input(s): GLUCAP in the last 168 hours.  Principal Problem:   Morbid obesity ([place]) Active Problems:   Osteopenia   Dyslipidemia   GERD (gastroesophageal  reflux disease)   Time coordinating discharge: 20 min  Signed:  Gayland Curry, MD [facility], [place] [phone] 03/16/2017, 7:30 AM

## 2017-03-20 ENCOUNTER — Encounter: Payer: Self-pay | Admitting: *Deleted

## 2017-03-20 ENCOUNTER — Other Ambulatory Visit: Payer: Self-pay | Admitting: *Deleted

## 2017-03-20 NOTE — Patient Outreach (Signed)
[facility]) Care Management  03/20/2017  Courtney Bradley 11/13/1950 [id number]   Subjective: Telephone call to patient's home number, spoke with patient, and HIPAA verified.  Discussed [facility] Care Management UMR Transition of care follow up, patient voiced understanding, and is in agreement to follow up.   Patient states she is doing much better, remembers speaking with this RNCM in the past, has surgeon follow up appointment on  04/12/17.  States she has a question regarding when she needs to resume hormone patch, and  was told by nurse at hospital to discontinue the patch for a while post hospitalization.   RNCM advised patient to follow up with surgeon or primary MD  to advise, regarding when to resume hormone patch treatment, patient voices understanding, and states she will follow up.  States she is accessing the following [facility] benefits: outpatient pharmacy, and has family medical leave act [employer]) place.  Patient states he does not have any education material, transition of care, care coordination, disease management, disease monitoring, transportation, community resource, or pharmacy needs at this time. States she is very appreciative of the follow up and is in agreement to receive [facility] information.   Objective: Per chart review, patient hospitalized  03/14/17 - 03/15/17.   Status post  LAPAROSCOPIC GASTRIC SLEEVE RESECTION on 03/14/17. Patient has a history of: obesity, depression, GERD, and Osteopenia.   Assessment: Received [facility] Consul for UMR Preoperative Call on 03/03/17. Preoperative call completed.   Transition of care follow up completed, no care management needs, and will proceed with case closure.    Plan: RNCM will send patient successful outreach letter, [facility] pamphlet, and magnet. RNCM will send case closure due to follow up completed / no care management needs request to [facility].   Ahmon Tosi H. Annia Friendly, BSN,  [place] [facility] Telephonic CM Phone: [phone] Fax: [phone]

## 2017-03-22 MED FILL — CLIMARA PRO PATCH: 0.045-0.015 | 28 days supply | Qty: 4 | Fill #3

## 2017-03-22 MED FILL — SERTRALINE HCL 50 MG TABLET: 50 | 90 days supply | Qty: 90 | Fill #1

## 2017-03-23 ENCOUNTER — Telehealth (HOSPITAL_COMMUNITY): Payer: Self-pay

## 2017-03-23 NOTE — Telephone Encounter (Addendum)
Left voicemail message for patient with contact information provided to discuss patient progress post sleeve surgery.   Made discharge phone call to patient. Asking the following questions.    1. Do you have someone to care for you now that you are home?  independent 2. Are you having pain now that is not relieved by your pain medication?  No pain meds used tylenol first couple of days 3. Are you able to drink the recommended daily amount of fluids (48 ounces minimum/day) and protein (60-80 grams/day) as prescribed by the dietitian or nutritional counselor?  42 ounces of fluid, counseled to increase intake, 45 grams protein 4. Are you taking the vitamins and minerals as prescribed?  Yes  5. Do you have the "on call" number to contact your surgeon if you have a problem or question?  yes 6. Are your incisions free of redness, swelling or drainage? (If steri strips, address that these can fall off, shower as tolerated) yes 7. Have your bowels moved since your surgery?  If not, are you passing gas?  yes 8. Are you up and walking 3-4 times per day?  yes 9. Were you provided your discharge medications before your surgery or before you were discharged from the hospital and are you taking them without problem?   yes

## 2017-03-28 ENCOUNTER — Encounter: Payer: Self-pay | Admitting: Skilled Nursing Facility1

## 2017-03-28 ENCOUNTER — Encounter: Payer: 59 | Attending: General Surgery | Admitting: Skilled Nursing Facility1

## 2017-03-28 DIAGNOSIS — E669 Obesity, unspecified: Secondary | ICD-10-CM | POA: Insufficient documentation

## 2017-03-28 DIAGNOSIS — Z713 Dietary counseling and surveillance: Secondary | ICD-10-CM | POA: Insufficient documentation

## 2017-03-28 NOTE — Progress Notes (Signed)
Bariatric Class:  Appt start time: 1530 end time:  1630.  2 Week Post-Operative Nutrition Class  Patient was seen on 03/28/2017 for Post[facility]ative [facility]   Surgery date: 03/14/2017 Surgery type: sleeve Start weight at [facility]: 237 Weight today: 223.8  TANITA  BODY COMP RESULTS  03/28/2017   BMI (kg/m^2) 36.1   Fat Mass (lbs) 108   Fat Free Mass (lbs) 115.8   Total Body Water (lbs) 83.2   The following the learning objectives were met by the patient during this course:  Identifies Phase 3A (Soft, High Proteins) Dietary Goals and will begin from 2 weeks post-operatively to 2 months post-operatively  Identifies appropriate sources of fluids and proteins   States protein recommendations and appropriate sources post-operatively  Identifies the need for appropriate texture modifications, mastication, and bite sizes when consuming solids  Identifies appropriate multivitamin and calcium sources post-operatively  Describes the need for physical activity post-operatively and will follow MD recommendations  States when to call healthcare provider regarding medication questions or post-operative complications  Handouts given during class include:  Phase 3A: Soft, High Protein Diet Handout  Follow-Up Plan: Patient will follow-up at [facility] in 6 weeks for 2 month post-op nutrition visit for diet advancement per MD.

## 2017-04-04 DIAGNOSIS — Z1283 Encounter for screening for malignant neoplasm of skin: Secondary | ICD-10-CM | POA: Diagnosis not present

## 2017-04-04 DIAGNOSIS — D225 Melanocytic nevi of trunk: Secondary | ICD-10-CM | POA: Diagnosis not present

## 2017-04-04 DIAGNOSIS — L720 Epidermal cyst: Secondary | ICD-10-CM | POA: Diagnosis not present

## 2017-04-04 DIAGNOSIS — D485 Neoplasm of uncertain behavior of skin: Secondary | ICD-10-CM | POA: Diagnosis not present

## 2017-04-14 MED FILL — PANTOPRAZOLE SOD DR 40 MG T: 40 | 30 days supply | Qty: 30 | Fill #0

## 2017-04-19 ENCOUNTER — Other Ambulatory Visit: Payer: Self-pay | Admitting: Adult Health

## 2017-04-19 DIAGNOSIS — D485 Neoplasm of uncertain behavior of skin: Secondary | ICD-10-CM | POA: Diagnosis not present

## 2017-04-19 DIAGNOSIS — L089 Local infection of the skin and subcutaneous tissue, unspecified: Secondary | ICD-10-CM | POA: Diagnosis not present

## 2017-04-20 MED FILL — SIMVASTATIN 20 MG TABLET: 20 | 90 days supply | Qty: 90 | Fill #0

## 2017-05-08 MED FILL — PANTOPRAZOLE SOD DR 40 MG T: 40 | 30 days supply | Qty: 30 | Fill #0

## 2017-05-09 ENCOUNTER — Encounter: Payer: Self-pay | Admitting: Registered"

## 2017-05-09 ENCOUNTER — Encounter: Payer: 59 | Attending: General Surgery | Admitting: Registered"

## 2017-05-09 DIAGNOSIS — E669 Obesity, unspecified: Secondary | ICD-10-CM | POA: Insufficient documentation

## 2017-05-09 DIAGNOSIS — Z713 Dietary counseling and surveillance: Secondary | ICD-10-CM | POA: Diagnosis not present

## 2017-05-09 NOTE — Patient Instructions (Addendum)
Goals:  Follow Phase 3B: High Protein + Non-Starchy Vegetables  Eat 3-6 small meals/snacks, every 3-5 hrs  Increase lean protein foods to meet 60g goal  Increase fluid intake to 64oz +  Avoid drinking 15 minutes before, during and 30 minutes after eating  Aim for >30 min of physical activity daily  - Try string cheese as snack option.   - Try [place] roll up as snack option with deli [place] and cheese.   - Aim to chew slowing and be mindful when eating.

## 2017-05-09 NOTE — Progress Notes (Signed)
Follow-up visit:  8 Weeks Post-Operative Sleeve Gastrectomy Surgery  Medical Nutrition Therapy:  Appt start time: 4:10 end time:  4:50.  Primary concerns today: Post-operative Bariatric Surgery Nutrition Management.  Non scale victories: walk longer distances  Surgery date: 03/14/2017 Surgery type: Sleeve gastrectomy [facility] weight at [facility]: 237 lbs Weight today: 214.8 lbs Weight change: 9 lbs from 223.8 lbs on 03/28/2017 Total weight lost: 22.2 lbs Weight loss goal: none stated   TANITA  BODY COMP RESULTS  03/28/2017 05/09/2017   BMI (kg/m^2) 36.1 34.7   Fat Mass (lbs) 108 99.8   Fat Free Mass (lbs) 115.8 115.0   Total Body Water (lbs) 83.2 82.4    Pt arrives with husband and does not want him to know her weight and numbers just yet. Pt states she wants to wait until she is less than 200 lbs before she shares her weight with him. Pt states she has not had any complications other than vomiting 4 times with: tuna, flounder, grilled burger; all due to eating too fast because she was really hungry. Pt states she is not meeting her fluid needs consistently.    Preferred Learning Style:   No preference indicated   Learning Readiness:   Ready  Change in progress  24-hr recall: B (AM): protein shake (30g) or boiled egg (12g) Snk (AM): cheese (6g)  L (PM): pork loin (14g) or deli [place] Snk (PM): cheese (6g), sugar-free jello or almonds   D (PM): lima beans, sugar-free jello w/ cool whip,  Snk (PM): cheese (6g)  Fluid intake: water (1-2, 32 oz), Nature's Twist, sugar free jello (3oz); 40-50 oz Estimated total protein intake: 60+ grams  Medications: See list Supplementation: Yes, Bariatric Adv. + 3 Ca  Using straws: no Drinking while eating: rarely Having you been chewing well: most times Chewing/swallowing difficulties: sometimes Changes in vision: no Changes to mood/headaches: no Hair loss/Changes to skin/Changes to nails: no, no, no Any difficulty focusing or  concentrating: no Sweating: no Dizziness/Lightheaded: no, no Palpitations: no  Carbonated beverages: no N/V/D/C/GAS: no, no, no, yes, no Abdominal Pain: no Dumping syndrome: no Last Lap-Band fill: N/A  Recent physical activity:  Walking 3-14mi, 3 days/week  Progress Towards Goal(s):  In progress.  Handouts given during visit include:  Phase 3B: High protein + NS vegetables   Nutritional Diagnosis:  Inadequate fluid intake As related to bariatric surgery post-op recommendations.  As evidenced by pt report of not consistently drinking at least 64 oz fluid daily.    Intervention:  Nutrition education and counseling. Pt was educated and counseled on the importance of meeting her fluid needs and ways to increase her intake. Pt was educated and counseled on how to prevent chewing/eating too fast at meal times. Goals:  Follow Phase 3B: High Protein + Non-Starchy Vegetables  Eat 3-6 small meals/snacks, every 3-5 hrs  Increase lean protein foods to meet 60g goal  Increase fluid intake to 64oz +  Avoid drinking 15 minutes before, during and 30 minutes after eating  Aim for >30 min of physical activity daily  - Try string cheese as snack option.  - Try [place] roll up as snack option with deli [place] and cheese.  - Aim to chew slowing and be mindful when eating.   Teaching Method Utilized:  Visual Auditory Hands on  Barriers to learning/adherence to lifestyle change: none  Demonstrated degree of understanding via:  Teach Back   Monitoring/Evaluation:  Dietary intake, exercise, lap band fills, and body weight. Follow up in  3 months for 5 month post-op visit.

## 2017-05-30 MED FILL — CLIMARA PRO PATCH: 0.045-0.015 | 28 days supply | Qty: 4 | Fill #4

## 2017-06-19 MED FILL — PANTOPRAZOLE SOD DR 40 MG T: 40 | 30 days supply | Qty: 30 | Fill #1

## 2017-07-10 MED FILL — SERTRALINE HCL 50 MG TABLET: 50 | 90 days supply | Qty: 90 | Fill #2

## 2017-07-19 MED FILL — PANTOPRAZOLE SOD DR 40 MG T: 40 | 30 days supply | Qty: 30 | Fill #2

## 2017-08-08 ENCOUNTER — Encounter: Payer: 59 | Attending: General Surgery | Admitting: Registered"

## 2017-08-08 ENCOUNTER — Encounter: Payer: Self-pay | Admitting: Registered"

## 2017-08-08 DIAGNOSIS — Z713 Dietary counseling and surveillance: Secondary | ICD-10-CM | POA: Insufficient documentation

## 2017-08-08 DIAGNOSIS — Z6832 Body mass index (BMI) 32.0-32.9, adult: Secondary | ICD-10-CM | POA: Diagnosis not present

## 2017-08-08 DIAGNOSIS — Z9884 Bariatric surgery status: Secondary | ICD-10-CM | POA: Insufficient documentation

## 2017-08-08 DIAGNOSIS — E669 Obesity, unspecified: Secondary | ICD-10-CM

## 2017-08-08 NOTE — Progress Notes (Signed)
Follow-up visit: 5 Months Post-Operative Sleeve Gastrectomy Surgery  Medical Nutrition Therapy:  Appt start time: 4:10 end time:  4:52.  Primary concerns today: Post-operative Bariatric Surgery Nutrition Management.  Non scale victories: walk longer distances, able to move better, improved mood, wearer smaller sizes (20-12/14), improved back pain  Surgery date: 03/14/2017 Surgery type: Sleeve gastrectomy Start weight at [facility]: 237 lbs Weight today: 201.8 lbs Weight change: 13 lbs from 214.8 lbs on 05/09/2017 Total weight lost: 35.2 lbs Weight loss goal: none stated, lose 30 more lbs (~170 lbs)  TANITA  BODY COMP RESULTS  03/28/2017 05/09/2017 08/08/2017   BMI (kg/m^2) 36.1 34.7 32.6   Fat Mass (lbs) 108 99.8 86.0   Fat Free Mass (lbs) 115.8 115.0 115.8   Total Body Water (lbs) 83.2 82.4 82.6    Pt arrives with husband and does not want him to know her weight and numbers just yet. Pt states her constipation is terrible. Pt states she did the Running of the Balls this past weekend. Pt's husband reports that pt eats fast and that is normally when she does not tolerate food well. Pt states she has started back drinking sweet tea and has added in fruit; handles it well. Pt states she can tolerate sweet foods well such as cake.   Pt states she wants to wait until she is less than 200 lbs before she shares her weight with him. Pt states she has not had any complications other than vomiting 4 times with: tuna, flounder, grilled burger; all due to eating too fast because she was really hungry. Pt states she is not meeting her fluid needs consistently.    Preferred Learning Style:   No preference indicated   Learning Readiness:   Ready  Change in progress  24-hr recall: B (AM): scrambled egg (6g), 2 slices bacon (6g), sometimes biscuit Snk (AM): nuts (7g), popcorn   L (PM): 2 oz turkey/baked chicken (14 oz), green beans, sugar-free jello  Snk (PM): a few dressing cheese (6g),  sugar-free jello or almonds   D (PM): 2-3 oz [place] (14-21g)  Snk (PM): sometimes cheese (6g), peanut butter crackers, or nuts   Fluid intake: water (2, 24 oz), sweet tea (12 oz),  sugar free jello (3oz); 40 oz Estimated total protein intake: 60+ grams  Medications: See list Supplementation: Yes, 1-2 Bariatric Adv. + 3 Ca  Using straws: no Drinking while eating: sometimes Having you been chewing well: sometimes Chewing/swallowing difficulties: sometimes Changes in vision: no Changes to mood/headaches: no Hair loss/Changes to skin/Changes to nails: no, no, no Any difficulty focusing or concentrating: no Sweating: no Dizziness/Lightheaded: no, no Palpitations: no  Carbonated beverages: no N/V/D/C/GAS: no, no, no, yes, no Abdominal Pain: no Dumping syndrome: no Last Lap-Band fill: N/A  Recent physical activity:  Walking 3-79mi, 3 days/week, Body Pump 2x/week  Progress Towards Goal(s):  In progress.  Handouts given during visit include:  Phase 3A: High Protein   Nutritional Diagnosis:  Inadequate fluid intake As related to bariatric surgery post-op recommendations.  As evidenced by pt report of not consistently drinking at least 64 oz fluid daily.    Intervention:  Nutrition education and counseling. Pt was educated and counseled on the importance of meeting her fluid needs and ways to increase her intake. Pt was educated and counseled on how to prevent chewing/eating too fast at meal times. Goals: - Aim to keep something to drink on you at all times to increase fluid intake.  - Aim to continue to chew  well.  - Aim to mindfully eat meals.   Teaching Method Utilized:  Visual Auditory Hands on  Barriers to learning/adherence to lifestyle change: contemplative stage of change  Demonstrated degree of understanding via:  Teach Back   Monitoring/Evaluation:  Dietary intake, exercise, lap band fills, and body weight. Follow up in 3 months for 5 month post-op visit.

## 2017-08-08 NOTE — Patient Instructions (Addendum)
-   Aim to keep something to drink on you at all times to increase fluid intake.   - Aim to continue to chew well.   - Aim to mindfully eat meals.

## 2017-08-16 MED FILL — PANTOPRAZOLE SOD DR 40 MG T: 40 | 30 days supply | Qty: 30 | Fill #3

## 2017-08-18 MED FILL — CLIMARA PRO PATCH: 0.045-0.015 | 28 days supply | Qty: 4 | Fill #5

## 2017-08-30 ENCOUNTER — Other Ambulatory Visit: Payer: Self-pay | Admitting: Adult Health

## 2017-08-30 DIAGNOSIS — Z1231 Encounter for screening mammogram for malignant neoplasm of breast: Secondary | ICD-10-CM

## 2017-09-15 MED FILL — PANTOPRAZOLE SOD DR 40 MG T: 40 | 90 days supply | Qty: 90 | Fill #1

## 2017-09-18 ENCOUNTER — Telehealth: Payer: Self-pay | Admitting: Adult Health

## 2017-09-18 DIAGNOSIS — Z9884 Bariatric surgery status: Secondary | ICD-10-CM

## 2017-09-18 DIAGNOSIS — Z131 Encounter for screening for diabetes mellitus: Secondary | ICD-10-CM

## 2017-09-18 DIAGNOSIS — Z01419 Encounter for gynecological examination (general) (routine) without abnormal findings: Secondary | ICD-10-CM

## 2017-09-18 DIAGNOSIS — E7800 Pure hypercholesterolemia, unspecified: Secondary | ICD-10-CM

## 2017-09-18 NOTE — Telephone Encounter (Signed)
Pt wants labs before physical

## 2017-09-19 ENCOUNTER — Telehealth: Payer: Self-pay | Admitting: Adult Health

## 2017-09-19 MED ORDER — PANTOPRAZOLE SODIUM 40 MG PO TBEC: 40.0000 mg | DELAYED_RELEASE_TABLET | Freq: Every day | ORAL | 99 refills | Status: DC

## 2017-09-19 NOTE — Telephone Encounter (Signed)
Pt requests refill on protonix,sent to [facility]

## 2017-09-28 DIAGNOSIS — Z9884 Bariatric surgery status: Secondary | ICD-10-CM | POA: Diagnosis not present

## 2017-09-28 DIAGNOSIS — Z01419 Encounter for gynecological examination (general) (routine) without abnormal findings: Secondary | ICD-10-CM | POA: Diagnosis not present

## 2017-09-28 DIAGNOSIS — E7800 Pure hypercholesterolemia, unspecified: Secondary | ICD-10-CM | POA: Diagnosis not present

## 2017-09-28 DIAGNOSIS — Z131 Encounter for screening for diabetes mellitus: Secondary | ICD-10-CM | POA: Diagnosis not present

## 2017-09-29 LAB — LIPID PANEL
CHOL/HDL RATIO: 2.9 ratio (ref 0.0–4.4)
CHOLESTEROL TOTAL: 183 mg/dL (ref 100–199)
HDL: 63 mg/dL (ref >39)
LDL CALC: 102 mg/dL — AB (ref 0–99)
Triglycerides: 89 mg/dL (ref 0–149)
VLDL Cholesterol Cal: 18 mg/dL (ref 5–40)

## 2017-09-29 LAB — CBC
HEMATOCRIT: 38.2 % (ref 34.0–46.6)
Hemoglobin: 11.5 g/dL (ref 11.1–15.9)
MCH: 21.6 pg — ABNORMAL LOW (ref 26.6–33.0)
MCHC: 30.1 g/dL — ABNORMAL LOW (ref 31.5–35.7)
MCV: 72 fL — AB (ref 79–97)
Platelets: 343 10*3/uL (ref 150–379)
RBC: 5.32 x10E6/uL — ABNORMAL HIGH (ref 3.77–5.28)
RDW: 16.2 % — AB (ref 12.3–15.4)
WBC: 5.6 10*3/uL (ref 3.4–10.8)

## 2017-09-29 LAB — COMPREHENSIVE METABOLIC PANEL
A/G RATIO: 1.5 (ref 1.2–2.2)
ALBUMIN: 4.1 g/dL (ref 3.6–4.8)
ALT: 14 IU/L (ref 0–32)
AST: 19 IU/L (ref 0–40)
Alkaline Phosphatase: 129 IU/L — ABNORMAL HIGH (ref 39–117)
BUN / CREAT RATIO: 13 (ref 12–28)
BUN: 13 mg/dL (ref 8–27)
Bilirubin Total: 0.5 mg/dL (ref 0.0–1.2)
CALCIUM: 9.7 mg/dL (ref 8.7–10.3)
CO2: 23 mmol/L (ref 20–29)
Chloride: 108 mmol/L — ABNORMAL HIGH (ref 96–106)
Creatinine, Ser: 0.98 mg/dL (ref 0.57–1.00)
GFR, EST AFRICAN AMERICAN: 70 mL/min/{1.73_m2} (ref >59)
GFR, EST NON AFRICAN AMERICAN: 61 mL/min/{1.73_m2} (ref >59)
GLOBULIN, TOTAL: 2.8 g/dL (ref 1.5–4.5)
Glucose: 89 mg/dL (ref 65–99)
POTASSIUM: 4.9 mmol/L (ref 3.5–5.2)
SODIUM: 144 mmol/L (ref 134–144)
Total Protein: 6.9 g/dL (ref 6.0–8.5)

## 2017-09-29 LAB — HEMOGLOBIN A1C
ESTIMATED AVERAGE GLUCOSE: 111 mg/dL
HEMOGLOBIN A1C: 5.5 % (ref 4.8–5.6)

## 2017-09-29 LAB — VITAMIN D 25 HYDROXY (VIT D DEFICIENCY, FRACTURES): Vit D, 25-Hydroxy: 40 ng/mL (ref 30.0–100.0)

## 2017-09-29 LAB — TSH: TSH: 1.6 u[IU]/mL (ref 0.450–4.500)

## 2017-10-02 ENCOUNTER — Telehealth: Payer: Self-pay | Admitting: Adult Health

## 2017-10-02 NOTE — Telephone Encounter (Signed)
Pt aware of labs, sp sleeve surgery in July 2018, take MV

## 2017-10-05 ENCOUNTER — Ambulatory Visit (HOSPITAL_COMMUNITY): Payer: Self-pay

## 2017-10-05 ENCOUNTER — Inpatient Hospital Stay (HOSPITAL_COMMUNITY): Admission: RE | Admit: 2017-10-05 | Payer: Self-pay | Source: Ambulatory Visit

## 2017-10-05 DIAGNOSIS — L720 Epidermal cyst: Secondary | ICD-10-CM | POA: Diagnosis not present

## 2017-10-09 DIAGNOSIS — H52223 Regular astigmatism, bilateral: Secondary | ICD-10-CM | POA: Diagnosis not present

## 2017-10-09 DIAGNOSIS — H5203 Hypermetropia, bilateral: Secondary | ICD-10-CM | POA: Diagnosis not present

## 2017-10-09 DIAGNOSIS — H524 Presbyopia: Secondary | ICD-10-CM | POA: Diagnosis not present

## 2017-10-16 ENCOUNTER — Ambulatory Visit (HOSPITAL_COMMUNITY)
Admission: RE | Admit: 2017-10-16 | Discharge: 2017-10-16 | Disposition: A | Discharge status: Home / Self Care | Payer: 59 | Source: Ambulatory Visit | Attending: Adult Health | Admitting: Adult Health

## 2017-10-16 DIAGNOSIS — Z1231 Encounter for screening mammogram for malignant neoplasm of breast: Secondary | ICD-10-CM | POA: Diagnosis not present

## 2017-10-27 ENCOUNTER — Other Ambulatory Visit: Payer: Self-pay | Admitting: Adult Health

## 2017-10-27 MED FILL — CLIMARA PRO PATCH: 0.045-0.015 | 28 days supply | Qty: 4 | Fill #0

## 2017-10-30 MED FILL — SERTRALINE HCL 50 MG TABLET: 50 | 90 days supply | Qty: 90 | Fill #0

## 2017-10-31 ENCOUNTER — Telehealth: Payer: Self-pay | Admitting: Registered"

## 2017-10-31 NOTE — Telephone Encounter (Signed)
RD returned pt's phone call and left voicemail.

## 2017-11-13 MED FILL — SIMVASTATIN 20 MG TABLET: 20 | 90 days supply | Qty: 90 | Fill #1

## 2017-11-14 ENCOUNTER — Ambulatory Visit: Payer: Self-pay | Admitting: Registered"

## 2017-12-22 ENCOUNTER — Telehealth: Payer: Self-pay | Admitting: Adult Health

## 2017-12-22 ENCOUNTER — Encounter (INDEPENDENT_AMBULATORY_CARE_PROVIDER_SITE_OTHER): Payer: Self-pay

## 2017-12-22 ENCOUNTER — Ambulatory Visit (INDEPENDENT_AMBULATORY_CARE_PROVIDER_SITE_OTHER): Payer: 59 | Admitting: Adult Health

## 2017-12-22 ENCOUNTER — Encounter: Payer: Self-pay | Admitting: Adult Health

## 2017-12-22 ENCOUNTER — Other Ambulatory Visit (HOSPITAL_COMMUNITY)
Admission: RE | Admit: 2017-12-22 | Discharge: 2017-12-22 | Disposition: A | Discharge status: Home / Self Care | Payer: 59 | Source: Ambulatory Visit | Attending: Adult Health | Admitting: Adult Health

## 2017-12-22 VITALS — BP 110/80 | HR 97 | Ht 66.0 in | Wt 190.0 lb

## 2017-12-22 DIAGNOSIS — Z1211 Encounter for screening for malignant neoplasm of colon: Secondary | ICD-10-CM

## 2017-12-22 DIAGNOSIS — K219 Gastro-esophageal reflux disease without esophagitis: Secondary | ICD-10-CM | POA: Diagnosis not present

## 2017-12-22 DIAGNOSIS — Z01419 Encounter for gynecological examination (general) (routine) without abnormal findings: Secondary | ICD-10-CM

## 2017-12-22 DIAGNOSIS — Z1212 Encounter for screening for malignant neoplasm of rectum: Secondary | ICD-10-CM

## 2017-12-22 DIAGNOSIS — Z719 Counseling, unspecified: Secondary | ICD-10-CM | POA: Insufficient documentation

## 2017-12-22 DIAGNOSIS — Z79890 Hormone replacement therapy (postmenopausal): Secondary | ICD-10-CM

## 2017-12-22 DIAGNOSIS — Z01411 Encounter for gynecological examination (general) (routine) with abnormal findings: Secondary | ICD-10-CM

## 2017-12-22 LAB — HEMOCCULT GUIAC POC 1CARD (OFFICE): FECAL OCCULT BLD: NEGATIVE

## 2017-12-22 MED ORDER — DEXLANSOPRAZOLE 60 MG PO CPDR: 60.0000 mg | DELAYED_RELEASE_CAPSULE | Freq: Every day | ORAL | 3 refills | Status: DC

## 2017-12-22 MED ORDER — TRIAMCINOLONE ACETONIDE 0.5 % EX OINT: 1.0000 "application " | TOPICAL_OINTMENT | Freq: Two times a day (BID) | CUTANEOUS | 0 refills | Status: DC

## 2017-12-22 MED ORDER — BACIT-POLY-NEO HC 1 % EX OINT: 1.0000 "application " | TOPICAL_OINTMENT | Freq: Two times a day (BID) | CUTANEOUS | 0 refills | Status: DC

## 2017-12-22 MED FILL — DEXILANT DR 60 MG CAPSULE: 60 | 30 days supply | Qty: 30 | Fill #0

## 2017-12-22 MED FILL — TRIAMCINOLONE 0.1% OINTMENT: 0.1 | 20 days supply | Qty: 15 | Fill #0

## 2017-12-22 NOTE — Progress Notes (Addendum)
Patient ID: Courtney Bradley, female   DOB: [birth year], 67 y.o.   MRN: [id number] History of Present Illness: Courtney Bradley,is a 67 year old white female, married, PM in for a well woman gyn exam and pap.She is sp sleeve surgery last July and has lost 59 lbs.She walks almost daily and is still working as Electronics engineer at [employer]. PCP is Dr Courtney Bradley.   Current Medications, Allergies, Past Medical History, Past Surgical History, Family History and Social History were reviewed in [employer] record.     Review of Systems: Patient denies any headaches, hearing loss, fatigue, blurred vision, shortness of breath, chest pain, abdominal pain, problems with bowel movements, urination, or intercourse. No joint pain or mood swings. +indigestion    Physical Exam:BP 110/80 (BP Location: Left Arm, Patient Position: Sitting, Cuff Size: Small)   Pulse 97   Ht 5\' 6"  (1.676 m)   Wt 190 lb (86.2 kg)   BMI 30.67 kg/m  General:  Well developed, well nourished, no acute distress Skin:  Warm and dry,has cracks in corner of mouth, has had Botox too Neck:  Midline trachea, normal thyroid, good ROM, no lymphadenopathy,no carotid bruits heard Lungs; Clear to auscultation bilaterally Breast:  No dominant palpable mass, retraction, or nipple discharge Cardiovascular: Regular rate and rhythm Abdomen:  Soft, non tender, no hepatosplenomegaly Pelvic:  External genitalia is normal in appearance, no lesions.  The vagina is normal in appearance. Urethra has no lesions or masses. The cervix is bulbous. Pap with HPV performed. Uterus is felt to be normal size, shape, and contour.  No adnexal masses or tenderness noted.Bladder is non tender, no masses felt. Rectal: Good sphincter tone, no polyps, or hemorrhoids felt.  Hemoccult negative. Extremities/musculoskeletal:  No swelling or varicosities noted, no clubbing or cyanosis Psych:  No mood changes, alert and cooperative,seems happy PHQ 9 score 0. Will try  dexilant.  Impression:  1. Encounter for gynecological examination with Papanicolaou smear of cervix   2. Screening for colorectal cancer   3. Gastroesophageal reflux disease, esophagitis presence not specified   4. Hormone replacement therapy (HRT)      Plan: Has F/U with Dr Courtney Bradley today on sleeve surgery  Meds ordered this encounter  Medications  . bacitracin-neomycin-polymyxin-hydrocortisone (CORTISPORIN) 1 % ointment    Sig: Apply 1 application topically 2 (two) times daily.    Dispense:  15 g    Refill:  0    Order Specific Question:   Supervising Provider    Answer:   EURE, LUTHER H [[id number]]  . dexlansoprazole (DEXILANT) 60 MG capsule    Sig: Take 1 capsule (60 mg total) by mouth daily.    Dispense:  30 capsule    Refill:  3    Order Specific Question:   Supervising Provider    Answer:   Tania Ade H [[id number]]   Continue climara pro has refills Physical in 1 year Pap in 3 if normal Mammogram yearly

## 2017-12-22 NOTE — Addendum Note (Signed)
Addended by: Diona Fanti A on: 12/22/2017 11:01 AM   Modules accepted: Orders

## 2017-12-22 NOTE — Telephone Encounter (Signed)
Pharmacy at [facility] called can't get cream I Rx'd earlier, will rx triamcinolone

## 2017-12-26 LAB — CYTOLOGY - PAP
DIAGNOSIS: NEGATIVE
HPV (WINDOPATH): NOT DETECTED

## 2018-01-18 MED FILL — CLIMARA PRO PATCH: 0.045-0.015 | 28 days supply | Qty: 4 | Fill #1

## 2018-01-31 MED FILL — SERTRALINE HCL 50 MG TABLET: 50 | 90 days supply | Qty: 90 | Fill #0

## 2018-02-06 MED FILL — DEXILANT DR 60 MG CAPSULE: 60 | 30 days supply | Qty: 30 | Fill #1

## 2018-02-21 ENCOUNTER — Telehealth: Payer: Self-pay | Admitting: Adult Health

## 2018-02-21 MED ORDER — MUPIROCIN 2 % EX OINT: 1.0000 "application " | TOPICAL_OINTMENT | Freq: Two times a day (BID) | CUTANEOUS | 0 refills | Status: DC

## 2018-02-21 MED FILL — MUPIROCIN 2% OINTMENT: 2 | 7 days supply | Qty: 22 | Fill #0

## 2018-02-21 NOTE — Telephone Encounter (Signed)
Pt request rx for bactroban, has used daughter's and it helped with cracks in corner of her mouth,will do.

## 2018-03-08 MED FILL — CLIMARA PRO PATCH: 0.045-0.015 | 28 days supply | Qty: 4 | Fill #2

## 2018-03-08 MED FILL — DEXILANT DR 60 MG CAPSULE: 60 | 30 days supply | Qty: 30 | Fill #2

## 2018-03-15 DIAGNOSIS — M8588 Other specified disorders of bone density and structure, other site: Secondary | ICD-10-CM | POA: Diagnosis not present

## 2018-03-15 DIAGNOSIS — K219 Gastro-esophageal reflux disease without esophagitis: Secondary | ICD-10-CM | POA: Diagnosis not present

## 2018-03-15 DIAGNOSIS — Z9884 Bariatric surgery status: Secondary | ICD-10-CM | POA: Diagnosis not present

## 2018-03-15 DIAGNOSIS — E7800 Pure hypercholesterolemia, unspecified: Secondary | ICD-10-CM | POA: Diagnosis not present

## 2018-03-15 DIAGNOSIS — R718 Other abnormality of red blood cells: Secondary | ICD-10-CM | POA: Diagnosis not present

## 2018-03-15 DIAGNOSIS — E663 Overweight: Secondary | ICD-10-CM | POA: Diagnosis not present

## 2018-04-25 MED FILL — CLIMARA PRO PATCH: 0.045-0.015 | 28 days supply | Qty: 4 | Fill #3

## 2018-04-30 MED FILL — DEXILANT DR 60 MG CAPSULE: 60 | 30 days supply | Qty: 30 | Fill #3

## 2018-05-07 ENCOUNTER — Telehealth: Payer: Self-pay | Admitting: Adult Health

## 2018-05-07 NOTE — Telephone Encounter (Signed)
Will order injectafer infusion low iron and ferritin levels

## 2018-05-09 ENCOUNTER — Encounter (HOSPITAL_COMMUNITY)
Admission: RE | Admit: 2018-05-09 | Discharge: 2018-05-09 | Disposition: A | Discharge status: Home / Self Care | Payer: 59 | Source: Ambulatory Visit | Attending: Adult Health | Admitting: Adult Health

## 2018-05-09 ENCOUNTER — Telehealth: Payer: Self-pay | Admitting: Adult Health

## 2018-05-09 DIAGNOSIS — E611 Iron deficiency: Secondary | ICD-10-CM

## 2018-05-09 DIAGNOSIS — R790 Abnormal level of blood mineral: Secondary | ICD-10-CM

## 2018-05-09 DIAGNOSIS — Z9884 Bariatric surgery status: Secondary | ICD-10-CM

## 2018-05-09 MED ORDER — FERRIC CARBOXYMALTOSE 750 MG/15ML IV SOLN
750.0000 mg | Freq: Once | INTRAVENOUS | Status: AC
  Administered 2018-05-09: 750 mg via INTRAVENOUS
  Filled 2018-05-09: qty 15

## 2018-05-09 MED ORDER — SODIUM CHLORIDE 0.9 % IV SOLN
Freq: Once | INTRAVENOUS | Status: AC
  Administered 2018-05-09: 250 mL via INTRAVENOUS

## 2018-05-09 NOTE — Telephone Encounter (Signed)
Courtney Bradley had injectafer infusion this am, and other next week, will check labs in about 5 weeks, iron and ferritin and CBC

## 2018-05-10 MED FILL — SERTRALINE HCL 50 MG TABLET: 50 | 90 days supply | Qty: 90 | Fill #1

## 2018-05-15 MED ORDER — SODIUM CHLORIDE 0.9 % IV SOLN
750.0000 mg | Freq: Once | INTRAVENOUS | Status: AC
  Administered 2018-05-16: 750 mg via INTRAVENOUS
  Filled 2018-05-15: qty 15

## 2018-05-16 ENCOUNTER — Encounter (HOSPITAL_COMMUNITY)
Admission: RE | Admit: 2018-05-16 | Discharge: 2018-05-16 | Disposition: A | Discharge status: Home / Self Care | Payer: 59 | Source: Ambulatory Visit | Attending: Adult Health | Admitting: Adult Health

## 2018-05-16 DIAGNOSIS — E611 Iron deficiency: Secondary | ICD-10-CM | POA: Diagnosis not present

## 2018-05-16 MED ORDER — SODIUM CHLORIDE 0.9 % IV SOLN
Freq: Once | INTRAVENOUS | Status: AC
  Administered 2018-05-16: 250 mL via INTRAVENOUS

## 2018-06-01 DIAGNOSIS — E611 Iron deficiency: Secondary | ICD-10-CM | POA: Diagnosis not present

## 2018-06-01 DIAGNOSIS — Z9884 Bariatric surgery status: Secondary | ICD-10-CM | POA: Diagnosis not present

## 2018-06-01 DIAGNOSIS — R790 Abnormal level of blood mineral: Secondary | ICD-10-CM | POA: Diagnosis not present

## 2018-06-02 LAB — CBC
HEMATOCRIT: 45.7 % (ref 34.0–46.6)
HEMOGLOBIN: 14.7 g/dL (ref 11.1–15.9)
MCH: 25.3 pg — AB (ref 26.6–33.0)
MCHC: 32.2 g/dL (ref 31.5–35.7)
MCV: 79 fL (ref 79–97)
Platelets: 308 10*3/uL (ref 150–450)
RBC: 5.82 x10E6/uL — AB (ref 3.77–5.28)
RDW: 23 % — ABNORMAL HIGH (ref 12.3–15.4)
WBC: 5.5 10*3/uL (ref 3.4–10.8)

## 2018-06-02 LAB — IRON,TIBC AND FERRITIN PANEL
Ferritin: 372 ng/mL — ABNORMAL HIGH (ref 15–150)
Iron Saturation: 32 % (ref 15–55)
Iron: 92 ug/dL (ref 27–139)
TIBC: 284 ug/dL (ref 250–450)
UIBC: 192 ug/dL (ref 118–369)

## 2018-06-06 ENCOUNTER — Other Ambulatory Visit: Payer: Self-pay | Admitting: Adult Health

## 2018-06-06 MED FILL — SIMVASTATIN 20 MG TABLET: 20 | 90 days supply | Qty: 90 | Fill #0

## 2018-06-06 MED FILL — DEXILANT DR 60 MG CAPSULE: 60 | 30 days supply | Qty: 30 | Fill #0

## 2018-06-21 MED FILL — CLIMARA PRO PATCH: 0.045-0.015 | 28 days supply | Qty: 4 | Fill #4

## 2018-07-16 MED FILL — DEXILANT DR 60 MG CAPSULE: 60 | 30 days supply | Qty: 30 | Fill #1

## 2018-08-16 MED FILL — CLIMARA PRO PATCH: 0.045-0.015 | 28 days supply | Qty: 4 | Fill #5

## 2018-08-17 ENCOUNTER — Other Ambulatory Visit (HOSPITAL_COMMUNITY): Payer: Self-pay | Admitting: Adult Health

## 2018-08-20 MED FILL — SERTRALINE HCL 50 MG TABLET: 50 | 90 days supply | Qty: 90 | Fill #0

## 2018-08-30 MED FILL — DEXILANT DR 60 MG CAPSULE: 60 | 30 days supply | Qty: 30 | Fill #2

## 2018-09-27 ENCOUNTER — Other Ambulatory Visit (HOSPITAL_COMMUNITY): Payer: Self-pay | Admitting: Adult Health

## 2018-09-27 ENCOUNTER — Ambulatory Visit (HOSPITAL_COMMUNITY)
Admission: RE | Admit: 2018-09-27 | Discharge: 2018-09-27 | Disposition: A | Discharge status: Home / Self Care | Payer: 59 | Source: Ambulatory Visit | Attending: Adult Health | Admitting: Adult Health

## 2018-09-27 DIAGNOSIS — Z1231 Encounter for screening mammogram for malignant neoplasm of breast: Secondary | ICD-10-CM

## 2018-10-01 MED FILL — CLIMARA PRO PATCH: 0.045-0.015 | 28 days supply | Qty: 4 | Fill #6

## 2018-10-08 MED FILL — DEXILANT DR 60 MG CAPSULE: 60 | 30 days supply | Qty: 30 | Fill #3

## 2018-11-06 ENCOUNTER — Institutional Professional Consult (permissible substitution): Payer: Self-pay | Admitting: Plastic Surgery

## 2018-11-08 ENCOUNTER — Other Ambulatory Visit: Payer: Self-pay | Admitting: Adult Health

## 2018-11-08 MED FILL — SIMVASTATIN 20 MG TABLET: 20 | 90 days supply | Qty: 90 | Fill #1

## 2018-11-08 MED FILL — SERTRALINE HCL 50 MG TABLET: 50 | 90 days supply | Qty: 90 | Fill #1

## 2018-11-08 MED FILL — DEXILANT DR 60 MG CAPSULE: 60 | 30 days supply | Qty: 30 | Fill #4 | Status: TO

## 2018-11-08 MED FILL — CLIMARA PRO PATCH: 0.045-0.015 | 28 days supply | Qty: 4 | Fill #0 | Status: TO

## 2018-11-16 ENCOUNTER — Institutional Professional Consult (permissible substitution): Payer: Self-pay | Admitting: Plastic Surgery

## 2018-12-14 MED FILL — DEXILANT DR 60 MG CAPSULE: 60 | 30 days supply | Qty: 30 | Fill #0

## 2018-12-27 MED FILL — CLIMARA PRO PATCH: 0.045-0.015 | 28 days supply | Qty: 4 | Fill #0

## 2018-12-28 ENCOUNTER — Other Ambulatory Visit: Payer: Self-pay | Admitting: Adult Health

## 2019-01-07 MED FILL — DEXILANT DR 60 MG CAPSULE: 60 | 30 days supply | Qty: 30 | Fill #1

## 2019-01-29 ENCOUNTER — Other Ambulatory Visit (HOSPITAL_COMMUNITY): Payer: Self-pay | Admitting: Pulmonary Disease

## 2019-01-29 ENCOUNTER — Other Ambulatory Visit: Payer: Self-pay

## 2019-01-29 ENCOUNTER — Ambulatory Visit (HOSPITAL_COMMUNITY)
Admission: RE | Admit: 2019-01-29 | Discharge: 2019-01-29 | Disposition: A | Discharge status: Home / Self Care | Payer: 59 | Source: Ambulatory Visit | Attending: Pulmonary Disease | Admitting: Pulmonary Disease

## 2019-01-29 DIAGNOSIS — M542 Cervicalgia: Secondary | ICD-10-CM

## 2019-01-29 DIAGNOSIS — M47812 Spondylosis without myelopathy or radiculopathy, cervical region: Secondary | ICD-10-CM | POA: Diagnosis not present

## 2019-01-31 ENCOUNTER — Other Ambulatory Visit (HOSPITAL_COMMUNITY): Payer: Self-pay | Admitting: Pulmonary Disease

## 2019-01-31 ENCOUNTER — Ambulatory Visit (HOSPITAL_COMMUNITY)
Admission: RE | Admit: 2019-01-31 | Discharge: 2019-01-31 | Disposition: A | Discharge status: Home / Self Care | Payer: 59 | Source: Ambulatory Visit | Attending: Pulmonary Disease | Admitting: Pulmonary Disease

## 2019-01-31 ENCOUNTER — Other Ambulatory Visit: Payer: Self-pay

## 2019-01-31 DIAGNOSIS — M542 Cervicalgia: Secondary | ICD-10-CM | POA: Diagnosis not present

## 2019-02-04 DIAGNOSIS — F321 Major depressive disorder, single episode, moderate: Secondary | ICD-10-CM | POA: Diagnosis not present

## 2019-02-04 DIAGNOSIS — M542 Cervicalgia: Secondary | ICD-10-CM | POA: Diagnosis not present

## 2019-02-04 DIAGNOSIS — E669 Obesity, unspecified: Secondary | ICD-10-CM | POA: Diagnosis not present

## 2019-02-05 ENCOUNTER — Other Ambulatory Visit: Payer: Self-pay | Admitting: Pulmonary Disease

## 2019-02-07 ENCOUNTER — Other Ambulatory Visit: Payer: Self-pay | Admitting: Pulmonary Disease

## 2019-02-07 DIAGNOSIS — M542 Cervicalgia: Secondary | ICD-10-CM

## 2019-02-11 ENCOUNTER — Other Ambulatory Visit: Payer: Self-pay | Admitting: Adult Health

## 2019-02-11 MED FILL — DEXILANT DR 60 MG CAPSULE: 60 | 30 days supply | Qty: 30 | Fill #0

## 2019-02-11 MED FILL — CLIMARA PRO PATCH: 0.045-0.015 | 28 days supply | Qty: 4 | Fill #1

## 2019-02-26 MED FILL — SERTRALINE HCL 50 MG TABLET: 50 | 90 days supply | Qty: 90 | Fill #0

## 2019-03-01 ENCOUNTER — Other Ambulatory Visit: Payer: Self-pay | Admitting: Adult Health

## 2019-03-01 ENCOUNTER — Ambulatory Visit
Admission: RE | Admit: 2019-03-01 | Discharge: 2019-03-01 | Disposition: A | Discharge status: Home / Self Care | Payer: 59 | Source: Ambulatory Visit | Attending: Pulmonary Disease | Admitting: Pulmonary Disease

## 2019-03-01 ENCOUNTER — Other Ambulatory Visit: Payer: Self-pay

## 2019-03-01 DIAGNOSIS — M542 Cervicalgia: Secondary | ICD-10-CM

## 2019-03-01 MED ORDER — IOPAMIDOL (ISOVUE-M 200) INJECTION 41%
1.0000 mL | Freq: Once | INTRAMUSCULAR | Status: AC
  Administered 2019-03-01: 14:00:00 1 mL via EPIDURAL

## 2019-03-01 MED ORDER — TRIAMCINOLONE ACETONIDE 40 MG/ML IJ SUSP (RADIOLOGY)
60.0000 mg | Freq: Once | INTRAMUSCULAR | Status: AC
  Administered 2019-03-01: 14:00:00 60 mg via EPIDURAL

## 2019-03-01 NOTE — Discharge Instructions (Signed)

## 2019-03-11 MED FILL — DEXILANT DR 60 MG CAPSULE: 60 | 30 days supply | Qty: 30 | Fill #1

## 2019-03-12 MED FILL — CLIMARA PRO PATCH: 0.045-0.015 | 28 days supply | Qty: 4 | Fill #2

## 2019-03-27 ENCOUNTER — Encounter: Payer: Self-pay | Admitting: Adult Health

## 2019-04-11 ENCOUNTER — Telehealth: Payer: Self-pay

## 2019-04-11 NOTE — Telephone Encounter (Signed)
Patient called to confirm appointment scheduled for tomorrow and to verify consult fee. Patient answered the following questions: 1. To the best of your knowledge, have you been in close contact with any one with a confirmed diagnosis of COVID-19? No 2. Have you had any one or more of the following; fever, chills, cough, shortness of breath, or any flu-like symptoms? No 3. Have you been diagnosed with or have a previous diagnosis of COVID 19? No 4. I am going to go over a few other symptoms with you. Please let me know if you are experiencing any of the following: None of the below a. Ear, nose, or throat discomfort b. A sore throat c. Headache d. Muscle pain e. Diarrhea f. Loss of taste or smell

## 2019-04-12 ENCOUNTER — Institutional Professional Consult (permissible substitution): Payer: Self-pay | Admitting: Plastic Surgery

## 2019-04-12 ENCOUNTER — Other Ambulatory Visit: Payer: 59 | Admitting: Adult Health

## 2019-04-15 ENCOUNTER — Other Ambulatory Visit: Payer: Self-pay | Admitting: Adult Health

## 2019-04-15 MED FILL — DEXILANT DR 60 MG CAPSULE: 60 | 30 days supply | Qty: 30 | Fill #0

## 2019-04-22 MED FILL — CLIMARA PRO PATCH: 0.045-0.015 | 28 days supply | Qty: 4 | Fill #3

## 2019-04-30 ENCOUNTER — Ambulatory Visit (INDEPENDENT_AMBULATORY_CARE_PROVIDER_SITE_OTHER): Payer: Self-pay | Admitting: Plastic Surgery

## 2019-04-30 ENCOUNTER — Encounter: Payer: Self-pay | Admitting: Plastic Surgery

## 2019-04-30 ENCOUNTER — Other Ambulatory Visit: Payer: Self-pay

## 2019-04-30 VITALS — BP 135/84 | HR 61 | Temp 98.0°F | Ht 66.25 in | Wt 181.0 lb

## 2019-04-30 DIAGNOSIS — Z719 Counseling, unspecified: Secondary | ICD-10-CM

## 2019-04-30 MED ORDER — LIDOCAINE-PRILOCAINE 2.5-2.5 % EX CREA: 1.0000 "application " | TOPICAL_CREAM | CUTANEOUS | 0 refills | Status: DC | PRN

## 2019-04-30 MED FILL — LIDOCAINE-PRILOCAINE CREAM: 2.5-2.5 | 10 days supply | Qty: 30 | Fill #0

## 2019-04-30 NOTE — Progress Notes (Signed)
Patient ID: Courtney Bradley, female    DOB: October 21, 1950, 68 y.o.   MRN: [id number]   Chief Complaint  Patient presents with  . Advice Only    for IPL treatment    The patient is a 68 year old facial rejuvenation.  She is 5 feet 6 inches tall and weighs 181 pounds.  She is interested in improving her skin texture and skin health.  She has widespread sun damage.  No areas of major concern.  A little bit of volume loss and some right attends   Review of Systems  Constitutional: Negative for activity change and appetite change.  HENT: Negative.   Eyes: Negative.   Respiratory: Negative for chest tightness and shortness of breath.   Cardiovascular: Negative for leg swelling.  Gastrointestinal: Negative for abdominal pain.  Endocrine: Negative.   Genitourinary: Negative.   Musculoskeletal: Negative.   Skin: Positive for color change. Negative for wound.  Neurological: Negative.   Hematological: Negative.   Psychiatric/Behavioral: Negative.     Past Medical History:  Diagnosis Date  . 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 Depression    hx of  . Depression with anxiety   . Elevated cholesterol 11/28/2016  . GERD (gastroesophageal reflux disease)   . History of kidney stones   . Hormone replacement therapy (HRT) 09/19/2014  . Obesity   . Osteopenia   . Pneumonia   . PONV (postoperative nausea and vomiting)     Past Surgical History:  Procedure Laterality Date  . CHOLECYSTECTOMY    . COLONOSCOPY N/A 11/17/2016   Procedure: COLONOSCOPY;  Surgeon: Rogene Houston, MD;  [facility];  Service: Endoscopy;  Laterality: N/A;  830  . CORRECTION HAMMER TOE Bilateral    2nd  . GALLBLADDER SURGERY    . LAPAROSCOPIC GASTRIC SLEEVE RESECTION WITH HIATAL HERNIA REPAIR N/A 03/14/2017   Procedure: LAPAROSCOPIC GASTRIC SLEEVE RESECTION WITH HIATAL HERNIA REPAIR;  Surgeon: Greer Pickerel, MD;  Location: [facility];  Service: General;  Laterality: N/A;  . LITHOTRIPSY    . neck lift    . UPPER  GI ENDOSCOPY  03/14/2017   Procedure: UPPER GI ENDOSCOPY;  Surgeon: Greer Pickerel, MD;  Location: [facility];  Service: General;;      Current Outpatient Medications:  .  Calcium Carbonate (CALCIUM 600 PO), Take 1 tablet by mouth every evening. , Disp: , Rfl:  .  CLIMARA PRO 0.045-0.015 MG/DAY, PLACE 1 PATCH ONTO THE SKIN ONCE A WEEK., Disp: 4 patch, Rfl: 12 .  DEXILANT 60 MG capsule, TAKE 1 CAPSULE BY MOUTH DAILY., Disp: 30 capsule, Rfl: 1 .  Multiple Vitamins-Minerals (MULTIVITAMIN WITH MINERALS) tablet, Take 1 tablet by mouth daily., Disp: , Rfl:  .  sertraline (ZOLOFT) 50 MG tablet, Take 50 mg by mouth at bedtime. , Disp: , Rfl: 3 .  simvastatin (ZOCOR) 20 MG tablet, TAKE 1 TABLET BY MOUTH ONCE DAILY, Disp: 90 tablet, Rfl: 4 .  lidocaine-prilocaine (EMLA) cream, Apply 1 application topically as needed., Disp: 30 g, Rfl: 0   Objective:   Vitals:   04/30/19 1552  BP: 135/84  Pulse: 61  Temp: 98 F (36.7 C)  SpO2: 97%    Physical Exam Vitals signs and nursing note reviewed.  Constitutional:      Appearance: Normal appearance.  HENT:     Head: Normocephalic and atraumatic.     Nose: Nose normal.  Eyes:     Extraocular Movements: Extraocular movements intact.  Cardiovascular:  Rate and Rhythm: Normal rate.  Pulmonary:     Effort: Pulmonary effort is normal.  Neurological:     General: No focal deficit present.     Mental Status: She is alert and oriented to person, place, and time.  Psychiatric:        Mood and Affect: Mood normal.        Behavior: Behavior normal.     Assessment & Plan:  Encounter for counseling  Recommend IPL treatment to start with.  She has some products at home that she is using for skin care.  I would say waiting until she is finished with those and then we can start her on [place], DO

## 2019-05-13 ENCOUNTER — Encounter: Payer: Self-pay | Admitting: *Deleted

## 2019-05-13 NOTE — Telephone Encounter (Signed)
Called and [facility] regarding the patient's MyChart message.//AB/CMA

## 2019-05-17 ENCOUNTER — Other Ambulatory Visit: Payer: Self-pay

## 2019-05-17 ENCOUNTER — Encounter: Payer: Self-pay | Admitting: Plastic Surgery

## 2019-05-17 ENCOUNTER — Ambulatory Visit (INDEPENDENT_AMBULATORY_CARE_PROVIDER_SITE_OTHER): Payer: Self-pay | Admitting: Plastic Surgery

## 2019-05-17 VITALS — BP 121/83 | HR 70 | Temp 98.4°F

## 2019-05-17 DIAGNOSIS — Z719 Counseling, unspecified: Secondary | ICD-10-CM

## 2019-05-17 NOTE — Progress Notes (Signed)
Preoperative Dx: Hyperpigmentation of the face  Postoperative Dx:  same  Procedure: laser to face  Anesthesia: none  Description of Procedure:  Risks and complications were explained to the patient. Consent was confirmed and signed. Time out was called and all information was confirmed to be correct. The area  area was prepped with alcohol and wiped dry. The IPL laser was set at 6 J/cm2. The face was lasered. The patient tolerated the procedure well and there were no complications. The patient is to follow up in 4 weeks.

## 2019-05-22 MED FILL — SIMVASTATIN 20 MG TABLET: 20 | 90 days supply | Qty: 90 | Fill #2

## 2019-05-22 MED FILL — SERTRALINE HCL 50 MG TABLET: 50 | 90 days supply | Qty: 90 | Fill #0

## 2019-05-22 MED FILL — CLIMARA PRO PATCH: 0.045-0.015 | 28 days supply | Qty: 4 | Fill #0

## 2019-05-22 MED FILL — DEXILANT DR 60 MG CAPSULE: 60 | 30 days supply | Qty: 30 | Fill #0

## 2019-06-21 ENCOUNTER — Other Ambulatory Visit: Payer: 59 | Admitting: Adult Health

## 2019-06-21 DIAGNOSIS — B078 Other viral warts: Secondary | ICD-10-CM | POA: Diagnosis not present

## 2019-06-21 DIAGNOSIS — K130 Diseases of lips: Secondary | ICD-10-CM | POA: Diagnosis not present

## 2019-06-26 ENCOUNTER — Other Ambulatory Visit: Payer: Self-pay | Admitting: Women's Health

## 2019-06-26 MED FILL — CLIMARA PRO PATCH: 0.045-0.015 | 28 days supply | Qty: 4 | Fill #1

## 2019-06-28 ENCOUNTER — Other Ambulatory Visit: Payer: Self-pay

## 2019-06-28 ENCOUNTER — Ambulatory Visit (INDEPENDENT_AMBULATORY_CARE_PROVIDER_SITE_OTHER): Payer: Self-pay

## 2019-06-28 VITALS — BP 119/83 | HR 72 | Temp 97.8°F

## 2019-06-28 DIAGNOSIS — Z719 Counseling, unspecified: Secondary | ICD-10-CM

## 2019-07-01 ENCOUNTER — Other Ambulatory Visit: Payer: Self-pay | Admitting: *Deleted

## 2019-07-01 MED ORDER — DEXILANT 60 MG PO CPDR: 1.0000 | DELAYED_RELEASE_CAPSULE | Freq: Every day | ORAL | 1 refills | Status: DC

## 2019-07-02 ENCOUNTER — Other Ambulatory Visit: Payer: Self-pay | Admitting: Adult Health

## 2019-07-02 MED ORDER — DEXILANT 60 MG PO CPDR: 1.0000 | DELAYED_RELEASE_CAPSULE | Freq: Every day | ORAL | 1 refills | Status: DC

## 2019-07-02 MED FILL — DEXILANT DR 60 MG CAPSULE: 60 | 90 days supply | Qty: 90 | Fill #0

## 2019-07-02 NOTE — Progress Notes (Signed)
Refill dexilant to [facility]

## 2019-08-05 MED FILL — CLIMARA PRO PATCH: 0.045-0.015 | 28 days supply | Qty: 4 | Fill #2

## 2019-08-12 NOTE — Progress Notes (Signed)
Preoperative Dx: hyperpigmentation  Postoperative Dx:  same  Procedure: laser to face   Anesthesia: none  Description of Procedure:  Risks and complications were explained to the patient. Consent was confirmed and signed. Time out was called and all information was confirmed to be correct. The area  area was prepped with alcohol and wiped dry. The IPL laser was set at 7 J/cm2. The face was lasered. The patient tolerated the procedure well and there were no complications. The patient is to follow up in 4 weeks.

## 2019-08-26 ENCOUNTER — Ambulatory Visit: Payer: 59 | Admitting: Family Medicine

## 2019-08-26 DIAGNOSIS — H5203 Hypermetropia, bilateral: Secondary | ICD-10-CM | POA: Diagnosis not present

## 2019-08-30 ENCOUNTER — Other Ambulatory Visit: Payer: 59 | Admitting: Adult Health

## 2019-09-09 ENCOUNTER — Telehealth: Payer: Self-pay | Admitting: Adult Health

## 2019-09-09 MED ORDER — SIMVASTATIN 20 MG PO TABS: 20.0000 mg | ORAL_TABLET | Freq: Every day | ORAL | 4 refills | Status: DC

## 2019-09-09 MED FILL — SIMVASTATIN 20 MG TABLET: 20 | 90 days supply | Qty: 90 | Fill #0

## 2019-09-09 NOTE — Telephone Encounter (Signed)
Pt needs refill on zoloft, Dr Luan Pulling retired. Will refill at [facility]

## 2019-09-11 ENCOUNTER — Telehealth: Payer: Self-pay | Admitting: Adult Health

## 2019-09-11 MED ORDER — SERTRALINE HCL 50 MG PO TABS: 50.0000 mg | ORAL_TABLET | Freq: Every day | ORAL | 3 refills | Status: DC

## 2019-09-11 MED FILL — SERTRALINE HCL 50 MG TABLET: 50 | 90 days supply | Qty: 90 | Fill #0

## 2019-09-11 NOTE — Telephone Encounter (Signed)
t says [facility] does not have rx for zoloft will resend

## 2019-09-13 ENCOUNTER — Other Ambulatory Visit: Payer: 59 | Admitting: Plastic Surgery

## 2019-09-16 MED FILL — CLIMARA PRO PATCH: 0.045-0.015 | 28 days supply | Qty: 4 | Fill #3

## 2019-09-23 MED FILL — DEXILANT DR 60 MG CAPSULE: 60 | 90 days supply | Qty: 90 | Fill #1

## 2019-09-25 ENCOUNTER — Other Ambulatory Visit: Payer: Self-pay

## 2019-09-25 ENCOUNTER — Other Ambulatory Visit (HOSPITAL_COMMUNITY): Payer: Self-pay | Admitting: Adult Health

## 2019-09-25 ENCOUNTER — Ambulatory Visit (HOSPITAL_COMMUNITY)
Admission: RE | Admit: 2019-09-25 | Discharge: 2019-09-25 | Disposition: A | Discharge status: Home / Self Care | Payer: 59 | Source: Ambulatory Visit | Attending: Adult Health | Admitting: Adult Health

## 2019-09-25 DIAGNOSIS — Z1231 Encounter for screening mammogram for malignant neoplasm of breast: Secondary | ICD-10-CM | POA: Insufficient documentation

## 2019-09-26 ENCOUNTER — Ambulatory Visit (HOSPITAL_COMMUNITY): Payer: 59

## 2019-09-30 ENCOUNTER — Ambulatory Visit (HOSPITAL_COMMUNITY): Payer: 59

## 2019-09-30 ENCOUNTER — Encounter (HOSPITAL_COMMUNITY): Payer: Self-pay

## 2019-10-09 MED FILL — CLIMARA PRO PATCH: 0.045-0.015 | 28 days supply | Qty: 4 | Fill #4

## 2019-10-11 ENCOUNTER — Other Ambulatory Visit: Payer: 59 | Admitting: Adult Health

## 2019-11-01 ENCOUNTER — Other Ambulatory Visit: Payer: 59 | Admitting: Adult Health

## 2019-11-13 ENCOUNTER — Other Ambulatory Visit: Payer: Self-pay | Admitting: Adult Health

## 2019-11-13 MED FILL — CLIMARA PRO PATCH: 0.045-0.015 | 28 days supply | Qty: 4 | Fill #0

## 2019-11-25 ENCOUNTER — Ambulatory Visit: Payer: 59 | Admitting: Family Medicine

## 2019-12-05 MED FILL — CLIMARA PRO PATCH: 0.045-0.015 | 28 days supply | Qty: 4 | Fill #1

## 2019-12-23 ENCOUNTER — Other Ambulatory Visit: Payer: Self-pay | Admitting: Adult Health

## 2019-12-23 MED FILL — DEXILANT DR 60 MG CAPSULE: 60 | 90 days supply | Qty: 90 | Fill #0

## 2019-12-23 MED FILL — SERTRALINE HCL 50 MG TABLET: 50 | 90 days supply | Qty: 90 | Fill #1

## 2019-12-27 ENCOUNTER — Ambulatory Visit (INDEPENDENT_AMBULATORY_CARE_PROVIDER_SITE_OTHER): Payer: 59 | Admitting: Adult Health

## 2019-12-27 ENCOUNTER — Other Ambulatory Visit: Payer: Self-pay

## 2019-12-27 ENCOUNTER — Encounter: Payer: Self-pay | Admitting: Adult Health

## 2019-12-27 VITALS — BP 123/88 | HR 116 | Ht 66.25 in | Wt 198.0 lb

## 2019-12-27 DIAGNOSIS — Z01419 Encounter for gynecological examination (general) (routine) without abnormal findings: Secondary | ICD-10-CM

## 2019-12-27 DIAGNOSIS — Z9884 Bariatric surgery status: Secondary | ICD-10-CM | POA: Diagnosis not present

## 2019-12-27 DIAGNOSIS — Z1212 Encounter for screening for malignant neoplasm of rectum: Secondary | ICD-10-CM | POA: Diagnosis not present

## 2019-12-27 DIAGNOSIS — Z1211 Encounter for screening for malignant neoplasm of colon: Secondary | ICD-10-CM

## 2019-12-27 DIAGNOSIS — Z79890 Hormone replacement therapy (postmenopausal): Secondary | ICD-10-CM | POA: Diagnosis not present

## 2019-12-27 LAB — HEMOCCULT GUIAC POC 1CARD (OFFICE): Fecal Occult Blood, POC: NEGATIVE

## 2019-12-27 NOTE — Progress Notes (Addendum)
Patient ID: Courtney Bradley, female   DOB: 08-Jul-1951, 69 y.o.   MRN: [id number] History of Present Illness:  Courtney Bradley is a 69 year old white female,married, PM, in for well woman gyn exam, she had a normal pap with negative HPV 12/22/17. 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  PCP is going to be Dr Willey Blade   Current Medications, Allergies, Past Medical History, Past Surgical History, Family History and Social History were reviewed in [facility] record.     Review of Systems:  Patient denies any headaches, hearing loss, fatigue, blurred vision, shortness of breath, chest pain, abdominal pain, problems with bowel movements, urination, or intercourse(not active). No joint pain or mood swings. Has some acid reflux still, had sleeve surgery Has gained some weight since COVID  Physical Exam:BP 123/88 (BP Location: Right Arm, Patient Position: Sitting, Cuff Size: Normal)   Pulse (!) 116   Ht 5' 6.25" (1.683 m)   Wt 198 lb (89.8 kg)   BMI 31.72 kg/m  General:  Well developed, well nourished, no acute distress Skin:  Warm and dry Neck:  Midline trachea, normal thyroid, good ROM, no lymphadenopathy, no carotid bruits heard Lungs; Clear to auscultation bilaterally Breast:  No dominant palpable mass, retraction, or nipple discharge Cardiovascular: Regular rate and rhythm Abdomen:  Soft, non tender, no hepatosplenomegaly Pelvic:  External genitalia is normal in appearance, no lesions.  The vagina is pale with fair moisture and loss of rugae. Urethra has no lesions or masses. The cervix is smooth.  Uterus is felt to be normal size, shape, and contour.  No adnexal masses or tenderness noted.Bladder is non tender, no masses felt. Rectal: Good sphincter tone, no polyps, or hemorrhoids felt.  Hemoccult negative. Extremities/musculoskeletal:  No swelling or varicosities noted, no clubbing or cyanosis Psych:  No mood changes, alert and  cooperative,seems happy AA is 2  Fall risk is low PHQ 9 score is 2 on Zoloft  Examination chaperoned by Rolena Infante LPN  Impression and Plan:  1. Encounter for well woman exam with routine gynecological exam Pap and physical in 1 year Mammogram yearly Labs with Dr Willey Blade Has refills on zocor, zoloft and dexilant   2. Screening for colorectal cancer Colonoscopy per GI  3. History of bariatric surgery  4. Hormone replacement therapy (HRT) Continue climara pro has refills

## 2020-01-06 MED FILL — CLIMARA PRO PATCH: 0.045-0.015 | 28 days supply | Qty: 4 | Fill #2

## 2020-01-09 ENCOUNTER — Telehealth: Payer: Self-pay | Admitting: Adult Health

## 2020-01-09 MED ORDER — SULFAMETHOXAZOLE-TRIMETHOPRIM 800-160 MG PO TABS: 1.0000 | ORAL_TABLET | Freq: Two times a day (BID) | ORAL | 0 refills | Status: DC

## 2020-01-09 NOTE — Telephone Encounter (Signed)
Courtney Bradley is having urinary frequency and pressure.push water, will rx septra ds

## 2020-01-10 DIAGNOSIS — Z79899 Other long term (current) drug therapy: Secondary | ICD-10-CM | POA: Diagnosis not present

## 2020-01-10 DIAGNOSIS — Z0001 Encounter for general adult medical examination with abnormal findings: Secondary | ICD-10-CM | POA: Diagnosis not present

## 2020-01-10 DIAGNOSIS — F329 Major depressive disorder, single episode, unspecified: Secondary | ICD-10-CM | POA: Diagnosis not present

## 2020-01-16 DIAGNOSIS — D759 Disease of blood and blood-forming organs, unspecified: Secondary | ICD-10-CM | POA: Diagnosis not present

## 2020-01-16 DIAGNOSIS — Z0001 Encounter for general adult medical examination with abnormal findings: Secondary | ICD-10-CM | POA: Diagnosis not present

## 2020-01-16 DIAGNOSIS — Z23 Encounter for immunization: Secondary | ICD-10-CM | POA: Diagnosis not present

## 2020-01-16 DIAGNOSIS — Z9884 Bariatric surgery status: Secondary | ICD-10-CM | POA: Diagnosis not present

## 2020-01-16 DIAGNOSIS — N200 Calculus of kidney: Secondary | ICD-10-CM | POA: Diagnosis not present

## 2020-01-17 ENCOUNTER — Other Ambulatory Visit (HOSPITAL_COMMUNITY): Payer: Self-pay | Admitting: Internal Medicine

## 2020-01-17 DIAGNOSIS — Z780 Asymptomatic menopausal state: Secondary | ICD-10-CM

## 2020-01-21 ENCOUNTER — Encounter (HOSPITAL_COMMUNITY): Payer: Self-pay

## 2020-01-21 ENCOUNTER — Other Ambulatory Visit: Payer: Self-pay

## 2020-01-22 ENCOUNTER — Inpatient Hospital Stay (HOSPITAL_COMMUNITY): Payer: 59

## 2020-01-22 ENCOUNTER — Encounter (HOSPITAL_COMMUNITY): Payer: Self-pay | Admitting: Hematology

## 2020-01-22 ENCOUNTER — Inpatient Hospital Stay (HOSPITAL_COMMUNITY): Payer: 59 | Attending: Hematology | Admitting: Hematology

## 2020-01-22 VITALS — BP 116/73 | HR 74 | Temp 97.7°F | Resp 18 | Ht 66.25 in | Wt 192.0 lb

## 2020-01-22 DIAGNOSIS — Z87442 Personal history of urinary calculi: Secondary | ICD-10-CM | POA: Insufficient documentation

## 2020-01-22 DIAGNOSIS — Z79899 Other long term (current) drug therapy: Secondary | ICD-10-CM | POA: Insufficient documentation

## 2020-01-22 DIAGNOSIS — D45 Polycythemia vera: Secondary | ICD-10-CM

## 2020-01-22 DIAGNOSIS — R748 Abnormal levels of other serum enzymes: Secondary | ICD-10-CM | POA: Diagnosis not present

## 2020-01-22 DIAGNOSIS — D751 Secondary polycythemia: Secondary | ICD-10-CM | POA: Diagnosis not present

## 2020-01-22 DIAGNOSIS — M8580 Other specified disorders of bone density and structure, unspecified site: Secondary | ICD-10-CM | POA: Diagnosis not present

## 2020-01-22 DIAGNOSIS — K219 Gastro-esophageal reflux disease without esophagitis: Secondary | ICD-10-CM | POA: Diagnosis not present

## 2020-01-22 DIAGNOSIS — E669 Obesity, unspecified: Secondary | ICD-10-CM | POA: Diagnosis not present

## 2020-01-22 DIAGNOSIS — E7800 Pure hypercholesterolemia, unspecified: Secondary | ICD-10-CM | POA: Insufficient documentation

## 2020-01-22 LAB — CBC WITH DIFFERENTIAL/PLATELET
Abs Immature Granulocytes: 0.05 10*3/uL (ref 0.00–0.07)
Basophils Absolute: 0.1 10*3/uL (ref 0.0–0.1)
Basophils Relative: 1 %
Eosinophils Absolute: 0.2 10*3/uL (ref 0.0–0.5)
Eosinophils Relative: 3 %
HCT: 48.9 % — ABNORMAL HIGH (ref 36.0–46.0)
Hemoglobin: 15.7 g/dL — ABNORMAL HIGH (ref 12.0–15.0)
Immature Granulocytes: 1 %
Lymphocytes Relative: 23 %
Lymphs Abs: 1.4 10*3/uL (ref 0.7–4.0)
MCH: 28.8 pg (ref 26.0–34.0)
MCHC: 32.1 g/dL (ref 30.0–36.0)
MCV: 89.6 fL (ref 80.0–100.0)
Monocytes Absolute: 0.7 10*3/uL (ref 0.1–1.0)
Monocytes Relative: 11 %
Neutro Abs: 3.7 10*3/uL (ref 1.7–7.7)
Neutrophils Relative %: 61 %
Platelets: 304 10*3/uL (ref 150–400)
RBC: 5.46 MIL/uL — ABNORMAL HIGH (ref 3.87–5.11)
RDW: 12.3 % (ref 11.5–15.5)
WBC: 6.1 10*3/uL (ref 4.0–10.5)
nRBC: 0 % (ref 0.0–0.2)

## 2020-01-22 LAB — LACTATE DEHYDROGENASE: LDH: 127 U/L (ref 98–192)

## 2020-01-22 NOTE — Patient Instructions (Signed)
[facility] at [facility] °Discharge Instructions ° °You were seen today by Dr. Katragadda. He went over your recent results. Dr. Katragadda will see you back in 2 weeks for labs and follow up. ° ° °Thank you for choosing [facility] at [facility] to provide your oncology and hematology care.  To afford each patient quality time with our provider, please arrive at least 15 minutes before your scheduled appointment time.  ° °If you have a lab appointment with the Cancer Center please come in thru the Main Entrance and check in at the main information desk ° °You need to re-schedule your appointment should you arrive 10 or more minutes late.  We strive to give you quality time with our providers, and arriving late affects you and other patients whose appointments are after yours.  Also, if you no show three or more times for appointments you may be dismissed from the clinic at the providers discretion.     °Again, thank you for choosing [facility].  Our hope is that these requests will decrease the amount of time that you wait before being seen by our physicians.       °_____________________________________________________________ ° °Should you have questions after your visit to [facility], please contact our office at [phone] between the hours of 8:00 a.m. and 4:30 p.m.  Voicemails left after 4:00 p.m. will not be returned until the following business day.  For prescription refill requests, have your pharmacy contact our office and allow 72 hours.   ° °Cancer Center Support Programs:  ° °> Cancer Support Group  °2nd Tuesday of the month 1pm-2pm, Journey Room  ° ° °

## 2020-01-22 NOTE — Progress Notes (Signed)
[facility] [address], [place] [address]   CLINIC:  Medical Oncology/Hematology  CONSULT NOTE  Patient Care Team: Asencion Noble, MD as PCP - General (Internal Medicine)  CHIEF COMPLAINTS/PURPOSE OF CONSULTATION:  Polycythemia  HISTORY OF PRESENTING ILLNESS:  Courtney Bradley 69 y.o. female is seen at the request of Dr. Willey Blade for further work-up and management of polycythemia.  CBC on 01/10/2020 shows hemoglobin 17 and hematocrit of 50.7.  White count and platelet was normal.  She reports feeling fine today. She usually donates a double portion of blood whenever she goes to blood donation. She denies itching after a hot showers, nor changes in color of skin on fingertips. She denies ever having clots. She has a history of kidney stones (2015) and treated with lithotripsy.  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  She was never smoker.  Her mother had breast and thyroid cancer, paternal uncles had cancer, and aunts on both sides had breast cancer.   MEDICAL HISTORY:  Past Medical History:  Diagnosis Date  . 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 Elevated cholesterol 11/28/2016  . GERD (gastroesophageal reflux disease)   . History of kidney stones   . Hormone replacement therapy (HRT) 09/19/2014  . Obesity   . Osteopenia   . Pneumonia   . PONV (postoperative nausea and vomiting)     SURGICAL HISTORY: Past Surgical History:  Procedure Laterality Date  . CHOLECYSTECTOMY    . COLONOSCOPY N/A 11/17/2016   Procedure: COLONOSCOPY;  Surgeon: Rogene Houston, MD;  [facility];  Service: Endoscopy;  Laterality: N/A;  830  . CORRECTION HAMMER TOE Bilateral    2nd  . GALLBLADDER SURGERY    . LAPAROSCOPIC GASTRIC SLEEVE RESECTION WITH HIATAL HERNIA REPAIR N/A 03/14/2017   Procedure: LAPAROSCOPIC GASTRIC SLEEVE RESECTION WITH HIATAL HERNIA REPAIR;  Surgeon: Greer Pickerel, MD;  Location: [facility];  Service: General;  Laterality: N/A;  .  LITHOTRIPSY    . neck lift    . UPPER GI ENDOSCOPY  03/14/2017   Procedure: UPPER GI ENDOSCOPY;  Surgeon: Greer Pickerel, MD;  Location: [facility];  Service: General;;    SOCIAL HISTORY: Social History   Socioeconomic History  . Marital status: Married    Spouse name: Not on file  . Number of children: 2  . Years of education: college  . Highest education level: Not on file  Occupational History  . Occupation: Engineer, drilling: [employer]  Tobacco Use  . Smoking status: Never Smoker  . Smokeless tobacco: Never Used  Substance and Sexual Activity  . Alcohol use: Yes    Alcohol/week: 0.0 standard drinks    Comment: very seldom  . Drug use: No  . Sexual activity: Not Currently    Birth control/protection: Post-menopausal  Other Topics Concern  . Not on file  Social History Narrative  . Not on file   Social Determinants of Health   Financial Resource Strain: Low Risk   . Difficulty of Paying Living Expenses: Not hard at all  Food Insecurity:   . Worried About Charity fundraiser in the Last Year:   . Arboriculturist in the Last Year:   Transportation Needs: No Transportation Needs  . Lack of Transportation (Medical): No  . Lack of Transportation (Non-Medical): No  Physical Activity: Sufficiently Active  . Days of Exercise per Week: 4 days  . Minutes of Exercise per  Session: 50 min  Stress: No Stress Concern Present  . Feeling of Stress : Not at all  Social Connections: Not Isolated  . Frequency of Communication with Friends and Family: More than three times a week  . Frequency of Social Gatherings with Friends and Family: Once a week  . Attends Religious Services: More than 4 times per year  . Active Member of Clubs or Organizations: Not on file  . Attends Archivist Meetings: 1 to 4 times per year  . Marital Status: Married  Human resources officer Violence: Not At Risk  . Fear of Current or Ex-Partner: No  . Emotionally Abused: No  . Physically Abused: No    . Sexually Abused: No    FAMILY HISTORY: Family History  Problem Relation Age of Onset  . Heart disease Mother   . Cancer Mother   . Diabetes Mother   . Coronary artery disease Mother   . Alzheimer's disease Father   . Cancer Father        prostate  . Cancer Maternal Aunt        breast  . Cancer Paternal Aunt        breast  . Cancer Maternal Aunt        breast  . Heart disease Paternal Grandmother   . Suicidality Maternal Grandfather   . Obesity Daughter   . Diabetes Daughter   . Hypertension Daughter   . Hypercholesterolemia Brother   . Diabetes Half-Sister   . Colon cancer Neg Hx     ALLERGIES:  is allergic to penicillins.  MEDICATIONS:  Current Outpatient Medications  Medication Sig Dispense Refill  . Calcium Carbonate (CALCIUM 600 PO) Take 1 tablet by mouth every evening.     Marland Kitchen CLIMARA PRO 0.045-0.015 MG/DAY PLACE 1 PATCH ONTO THE SKIN ONCE A WEEK. 4 patch 7  . DEXILANT 60 MG capsule TAKE 1 CAPSULE BY MOUTH ONCE DAILY 90 capsule 1  . Multiple Vitamins-Minerals (MULTIVITAMIN WITH MINERALS) tablet Take 1 tablet by mouth daily.    . sertraline (ZOLOFT) 50 MG tablet Take 1 tablet (50 mg total) by mouth at bedtime. 90 tablet 3  . simvastatin (ZOCOR) 20 MG tablet Take 1 tablet (20 mg total) by mouth daily. 90 tablet 4   No current facility-administered medications for this visit.    REVIEW OF SYSTEMS:   Review of Systems  Constitutional: Negative for appetite change and fatigue.  Gastrointestinal: Positive for constipation.  Neurological: Positive for numbness (R hand).  All other systems reviewed and are negative.    PHYSICAL EXAMINATION: ECOG PERFORMANCE STATUS: 0 - Asymptomatic  Vitals:   01/22/20 1426  BP: 116/73  Pulse: 74  Resp: 18  Temp: 97.7 F (36.5 C)  SpO2: 98%   Filed Weights   01/22/20 1426  Weight: 192 lb (87.1 kg)   Physical Exam Vitals reviewed.  Constitutional:      Appearance: Normal appearance.  Cardiovascular:     Rate and  Rhythm: Normal rate and regular rhythm.  Pulmonary:     Effort: Pulmonary effort is normal.     Breath sounds: Normal breath sounds.  Abdominal:     Palpations: Abdomen is soft. There is no hepatomegaly, splenomegaly or mass.     Tenderness: There is no abdominal tenderness.  Musculoskeletal:     Right lower leg: No edema.     Left lower leg: No edema.  Lymphadenopathy:     Cervical: No cervical adenopathy.     Upper Body:  Right upper body: No axillary adenopathy.     Left upper body: No axillary adenopathy.     Lower Body: No right inguinal adenopathy. No left inguinal adenopathy.  Neurological:     General: No focal deficit present.     Mental Status: She is alert and oriented to person, place, and time.  Psychiatric:        Mood and Affect: Mood normal.        Behavior: Behavior normal.      LABORATORY DATA:  I have reviewed the data as listed Recent Results (from the past 2160 hour(s))  POCT occult blood stool     Status: None   Collection Time: 12/27/19 10:20 AM  Result Value Ref Range   Fecal Occult Blood, POC Negative Negative   Card #1 Date     Card #2 Fecal Occult Blod, POC     Card #2 Date     Card #3 Fecal Occult Blood, POC     Card #3 Date     Lab Results  Component Value Date   TIBC 284 06/01/2018   TIBC 455 (H) 01/03/2017   FERRITIN 372 (H) 06/01/2018   IRONPCTSAT 32 06/01/2018   IRONPCTSAT 8 (L) 01/03/2017     RADIOGRAPHIC STUDIES: I have personally reviewed the radiological images as listed and agreed with the findings in the report.  ASSESSMENT:  1. Polycythemia: -CBC on 01/10/2020 done at Dr. Ria Comment office shows hemoglobin 17 and hematocrit of 50.7.  RBC count was elevated at 5.79.  White count was 4.7 and platelet count was 287.  Differential was normal. -She is a non-smoker.  No vasomotor symptoms or erythromelalgia.  No prior history of thrombosis. -CTAP on 06/30/2014 shows left hydronephrosis secondary to a stone at the left UPJ.  Tiny  nonobstructing left renal calculus.  Left adrenal adenoma present.  Spleen and liver are normal.  PLAN:  1. Polycythemia: -We will repeat her CBC today.  We will check for LDH. -We will evaluate for myeloproliferative disorders by checking JAK2 V617F and reflex testing. -We will also obtain a baseline erythropoietin level. -We will see her back in 2 weeks for follow-up.  2.  Elevated alkaline phosphatase: -Recent labs on 01/10/2020 shows elevated alkaline phosphatase of 134. -Patient had extensive work-up previously including a bone scan in November 2012 which showed mild increased uptake in the left sacral ala corresponding to insufficiency fracture on the MR. -We will check for serum protein after pheresis.   All questions were answered. The patient knows to call the clinic with any problems, questions or concerns.    Derek Jack, MD 01/22/20 2:44 PM  [place] [phone]   I, Jacqualyn Posey, am acting as a scribe for Dr. Sanda Linger.  I, Derek Jack MD, have reviewed the above documentation for accuracy and completeness, and I agree with the above.

## 2020-01-23 LAB — ERYTHROPOIETIN: Erythropoietin: 6.8 m[IU]/mL (ref 2.6–18.5)

## 2020-01-23 LAB — PROTEIN ELECTROPHORESIS, SERUM
A/G Ratio: 1 (ref 0.7–1.7)
Albumin ELP: 3.4 g/dL (ref 2.9–4.4)
Alpha-1-Globulin: 0.3 g/dL (ref 0.0–0.4)
Alpha-2-Globulin: 0.8 g/dL (ref 0.4–1.0)
Beta Globulin: 1.2 g/dL (ref 0.7–1.3)
Gamma Globulin: 1.3 g/dL (ref 0.4–1.8)
Globulin, Total: 3.5 g/dL (ref 2.2–3.9)
Total Protein ELP: 6.9 g/dL (ref 6.0–8.5)

## 2020-01-30 ENCOUNTER — Other Ambulatory Visit: Payer: Self-pay

## 2020-01-30 ENCOUNTER — Ambulatory Visit (HOSPITAL_COMMUNITY)
Admission: RE | Admit: 2020-01-30 | Discharge: 2020-01-30 | Disposition: A | Discharge status: Home / Self Care | Payer: 59 | Source: Ambulatory Visit | Attending: Internal Medicine | Admitting: Internal Medicine

## 2020-01-30 DIAGNOSIS — Z780 Asymptomatic menopausal state: Secondary | ICD-10-CM | POA: Insufficient documentation

## 2020-01-30 DIAGNOSIS — M85852 Other specified disorders of bone density and structure, left thigh: Secondary | ICD-10-CM | POA: Insufficient documentation

## 2020-01-30 LAB — JAK2 V617F, W REFLEX TO CALR/E12/MPL

## 2020-01-30 LAB — CALR + JAK2 E12-15 + MPL (REFLEXED)

## 2020-01-31 ENCOUNTER — Other Ambulatory Visit: Payer: Self-pay | Admitting: Internal Medicine

## 2020-01-31 ENCOUNTER — Other Ambulatory Visit (HOSPITAL_COMMUNITY): Payer: Self-pay | Admitting: *Deleted

## 2020-01-31 DIAGNOSIS — D751 Secondary polycythemia: Secondary | ICD-10-CM

## 2020-01-31 DIAGNOSIS — D45 Polycythemia vera: Secondary | ICD-10-CM

## 2020-01-31 DIAGNOSIS — M542 Cervicalgia: Secondary | ICD-10-CM

## 2020-02-03 ENCOUNTER — Inpatient Hospital Stay (HOSPITAL_COMMUNITY): Payer: 59

## 2020-02-03 ENCOUNTER — Ambulatory Visit (HOSPITAL_COMMUNITY): Payer: 59 | Admitting: Hematology

## 2020-02-06 ENCOUNTER — Other Ambulatory Visit: Payer: Self-pay

## 2020-02-06 ENCOUNTER — Inpatient Hospital Stay (HOSPITAL_COMMUNITY): Payer: 59

## 2020-02-06 DIAGNOSIS — R748 Abnormal levels of other serum enzymes: Secondary | ICD-10-CM | POA: Diagnosis not present

## 2020-02-06 DIAGNOSIS — D751 Secondary polycythemia: Secondary | ICD-10-CM | POA: Diagnosis not present

## 2020-02-06 DIAGNOSIS — D45 Polycythemia vera: Secondary | ICD-10-CM

## 2020-02-06 DIAGNOSIS — K219 Gastro-esophageal reflux disease without esophagitis: Secondary | ICD-10-CM | POA: Diagnosis not present

## 2020-02-06 DIAGNOSIS — M8580 Other specified disorders of bone density and structure, unspecified site: Secondary | ICD-10-CM | POA: Diagnosis not present

## 2020-02-06 DIAGNOSIS — Z79899 Other long term (current) drug therapy: Secondary | ICD-10-CM | POA: Diagnosis not present

## 2020-02-06 DIAGNOSIS — E7800 Pure hypercholesterolemia, unspecified: Secondary | ICD-10-CM | POA: Diagnosis not present

## 2020-02-06 DIAGNOSIS — E669 Obesity, unspecified: Secondary | ICD-10-CM | POA: Diagnosis not present

## 2020-02-06 DIAGNOSIS — Z87442 Personal history of urinary calculi: Secondary | ICD-10-CM | POA: Diagnosis not present

## 2020-02-06 LAB — CBC WITH DIFFERENTIAL/PLATELET
Abs Immature Granulocytes: 0.05 10*3/uL (ref 0.00–0.07)
Basophils Absolute: 0.1 10*3/uL (ref 0.0–0.1)
Basophils Relative: 1 %
Eosinophils Absolute: 0.2 10*3/uL (ref 0.0–0.5)
Eosinophils Relative: 3 %
HCT: 51.1 % — ABNORMAL HIGH (ref 36.0–46.0)
Hemoglobin: 16.8 g/dL — ABNORMAL HIGH (ref 12.0–15.0)
Immature Granulocytes: 1 %
Lymphocytes Relative: 19 %
Lymphs Abs: 1.2 10*3/uL (ref 0.7–4.0)
MCH: 29.2 pg (ref 26.0–34.0)
MCHC: 32.9 g/dL (ref 30.0–36.0)
MCV: 88.7 fL (ref 80.0–100.0)
Monocytes Absolute: 0.8 10*3/uL (ref 0.1–1.0)
Monocytes Relative: 13 %
Neutro Abs: 3.9 10*3/uL (ref 1.7–7.7)
Neutrophils Relative %: 63 %
Platelets: 308 10*3/uL (ref 150–400)
RBC: 5.76 MIL/uL — ABNORMAL HIGH (ref 3.87–5.11)
RDW: 12.2 % (ref 11.5–15.5)
WBC: 6.2 10*3/uL (ref 4.0–10.5)
nRBC: 0 % (ref 0.0–0.2)

## 2020-02-06 LAB — LACTATE DEHYDROGENASE: LDH: 128 U/L (ref 98–192)

## 2020-02-11 LAB — PROTEIN ELECTROPHORESIS, SERUM
A/G Ratio: 1 (ref 0.7–1.7)
Albumin ELP: 3.5 g/dL (ref 2.9–4.4)
Alpha-1-Globulin: 0.2 g/dL (ref 0.0–0.4)
Alpha-2-Globulin: 0.8 g/dL (ref 0.4–1.0)
Beta Globulin: 1.1 g/dL (ref 0.7–1.3)
Gamma Globulin: 1.3 g/dL (ref 0.4–1.8)
Globulin, Total: 3.5 g/dL (ref 2.2–3.9)
Total Protein ELP: 7 g/dL (ref 6.0–8.5)

## 2020-02-12 MED FILL — SIMVASTATIN 20 MG TABLET: 20 | 90 days supply | Qty: 90 | Fill #1

## 2020-02-13 ENCOUNTER — Other Ambulatory Visit: Payer: 59

## 2020-02-21 MED FILL — CLIMARA PRO PATCH: 0.045-0.015 | 28 days supply | Qty: 4 | Fill #4

## 2020-02-27 ENCOUNTER — Inpatient Hospital Stay (HOSPITAL_COMMUNITY): Payer: 59 | Attending: Hematology | Admitting: Hematology

## 2020-02-27 ENCOUNTER — Other Ambulatory Visit: Payer: Self-pay

## 2020-02-27 VITALS — BP 130/80 | HR 85 | Temp 97.7°F | Resp 18 | Wt 191.0 lb

## 2020-02-27 DIAGNOSIS — K219 Gastro-esophageal reflux disease without esophagitis: Secondary | ICD-10-CM | POA: Insufficient documentation

## 2020-02-27 DIAGNOSIS — Z8249 Family history of ischemic heart disease and other diseases of the circulatory system: Secondary | ICD-10-CM | POA: Diagnosis not present

## 2020-02-27 DIAGNOSIS — Z833 Family history of diabetes mellitus: Secondary | ICD-10-CM | POA: Insufficient documentation

## 2020-02-27 DIAGNOSIS — Z79899 Other long term (current) drug therapy: Secondary | ICD-10-CM | POA: Insufficient documentation

## 2020-02-27 DIAGNOSIS — Z8349 Family history of other endocrine, nutritional and metabolic diseases: Secondary | ICD-10-CM | POA: Diagnosis not present

## 2020-02-27 DIAGNOSIS — Z8042 Family history of malignant neoplasm of prostate: Secondary | ICD-10-CM | POA: Diagnosis not present

## 2020-02-27 DIAGNOSIS — E7800 Pure hypercholesterolemia, unspecified: Secondary | ICD-10-CM | POA: Insufficient documentation

## 2020-02-27 DIAGNOSIS — D751 Secondary polycythemia: Secondary | ICD-10-CM | POA: Insufficient documentation

## 2020-02-27 DIAGNOSIS — M8580 Other specified disorders of bone density and structure, unspecified site: Secondary | ICD-10-CM | POA: Diagnosis not present

## 2020-02-27 DIAGNOSIS — N132 Hydronephrosis with renal and ureteral calculous obstruction: Secondary | ICD-10-CM | POA: Insufficient documentation

## 2020-02-27 DIAGNOSIS — Z803 Family history of malignant neoplasm of breast: Secondary | ICD-10-CM | POA: Insufficient documentation

## 2020-02-27 NOTE — Patient Instructions (Signed)
[place] at [facility] Discharge Instructions  You were seen today by Dr. Delton Coombes. He went over your recent results. Continue your usual care with your primary care provider. Dr. Delton Coombes will see you back in 6 months for labs and follow up.   Thank you for choosing [place] at [facility] to provide your oncology and hematology care.  To afford each patient quality time with our provider, please arrive at least 15 minutes before your scheduled appointment time.   If you have a lab appointment with the [place] please come in thru the Main Entrance and check in at the main information desk  You need to re-schedule your appointment should you arrive 10 or more minutes late.  We strive to give you quality time with our providers, and arriving late affects you and other patients whose appointments are after yours.  Also, if you no show three or more times for appointments you may be dismissed from the clinic at the providers discretion.     Again, thank you for choosing [facility].  Our hope is that these requests will decrease the amount of time that you wait before being seen by our physicians.       _____________________________________________________________  Should you have questions after your visit to [facility], please contact our office at [phone] between the hours of 8:00 a.m. and 4:30 p.m.  Voicemails left after 4:00 p.m. will not be returned until the following business day.  For prescription refill requests, have your pharmacy contact our office and allow 72 hours.    Cancer Center Support Programs:   > Cancer Support Group  2nd Tuesday of the month 1pm-2pm, Journey Room

## 2020-02-27 NOTE — Progress Notes (Signed)
[place], [place] [address]   CLINIC:  Medical Oncology/Hematology  PCP:  Asencion Noble, MD [address] / [place] [address]  [phone]  REASON FOR VISIT:  Follow-up for polycythemia  PRIOR THERAPY: None  CURRENT THERAPY: Observation  INTERVAL HISTORY:  [name], a 69 y.o. female, returns for routine follow-up for her polycythemia. Courtney Bradley was last seen on 01/22/2020.  Today she reports no new issues, including changes in the color of her fingertips or itching after a hot shower. She continues walking and tries to do weight training since her previous insufficiency fracture.   REVIEW OF SYSTEMS:  Review of Systems  Constitutional: Negative for appetite change and fatigue.  Gastrointestinal: Positive for constipation.  All other systems reviewed and are negative.   PAST MEDICAL/SURGICAL HISTORY:  Past Medical History:  Diagnosis Date  . 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 Elevated cholesterol 11/28/2016  . GERD (gastroesophageal reflux disease)   . History of kidney stones   . Hormone replacement therapy (HRT) 09/19/2014  . Obesity   . Osteopenia   . Pneumonia   . PONV (postoperative nausea and vomiting)    Past Surgical History:  Procedure Laterality Date  . CHOLECYSTECTOMY    . COLONOSCOPY N/A 11/17/2016   Procedure: COLONOSCOPY;  Surgeon: Rogene Houston, MD;  Location: [facility];  Service: Endoscopy;  Laterality: N/A;  830  . CORRECTION HAMMER TOE Bilateral    2nd  . GALLBLADDER SURGERY    . LAPAROSCOPIC GASTRIC SLEEVE RESECTION WITH HIATAL HERNIA REPAIR N/A 03/14/2017   Procedure: LAPAROSCOPIC GASTRIC SLEEVE RESECTION WITH HIATAL HERNIA REPAIR;  Surgeon: Greer Pickerel, MD;  Location: [facility] ORS;  Service: General;  Laterality: N/A;  . LITHOTRIPSY    . neck lift    . UPPER GI ENDOSCOPY  03/14/2017   Procedure: UPPER GI ENDOSCOPY;  Surgeon: Greer Pickerel, MD;  Location: [facility] ORS;  Service: General;;     SOCIAL HISTORY:  Social History   Socioeconomic History  . Marital status: Married    Spouse name: Not on file  . Number of children: 2  . Years of education: college  . Highest education level: Not on file  Occupational History  . Occupation: Engineer, drilling: [employer]  Tobacco Use  . Smoking status: Never Smoker  . Smokeless tobacco: Never Used  Vaping Use  . Vaping Use: Never used  Substance and Sexual Activity  . Alcohol use: Yes    Alcohol/week: 0.0 standard drinks    Comment: very seldom  . Drug use: No  . Sexual activity: Not Currently    Birth control/protection: Post-menopausal  Other Topics Concern  . Not on file  Social History Narrative  . Not on file   Social Determinants of Health   Financial Resource Strain: Low Risk   . Difficulty of Paying Living Expenses: Not hard at all  Food Insecurity:   . Worried About Charity fundraiser in the Last Year:   . Arboriculturist in the Last Year:   Transportation Needs: No Transportation Needs  . Lack of Transportation (Medical): No  . Lack of Transportation (Non-Medical): No  Physical Activity: Sufficiently Active  . Days of Exercise per Week: 4 days  . Minutes of Exercise per Session: 50 min  Stress: No Stress Concern Present  . Feeling of Stress : Not at all  Social Connections: Socially Integrated  . Frequency  of Communication with Friends and Family: More than three times a week  . Frequency of Social Gatherings with Friends and Family: Once a week  . Attends Religious Services: More than 4 times per year  . Active Member of Clubs or Organizations: Not on file  . Attends Archivist Meetings: 1 to 4 times per year  . Marital Status: Married  Human resources officer Violence: Not At Risk  . Fear of Current or Ex-Partner: No  . Emotionally Abused: No  . Physically Abused: No  . Sexually Abused: No    FAMILY HISTORY:  Family History  Problem Relation Age of Onset  . Heart disease Mother    . Diabetes Mother   . Coronary artery disease Mother   . Cancer Mother        Breast and thyroid cancer  . Alzheimer's disease Father   . Cancer Father        prostate  . Cancer Maternal Aunt        breast  . Cancer Paternal Aunt        breast  . Cancer Maternal Aunt        breast  . Heart disease Paternal Grandmother   . Suicidality Maternal Grandfather   . Obesity Daughter   . Diabetes Daughter   . Hypertension Daughter   . Hypercholesterolemia Brother   . Diabetes Half-Sister   . Colon cancer Neg Hx     CURRENT MEDICATIONS:  Current Outpatient Medications  Medication Sig Dispense Refill  . Calcium Carbonate (CALCIUM 600 PO) Take 1 tablet by mouth every evening.     Marland Kitchen CLIMARA PRO 0.045-0.015 MG/DAY PLACE 1 PATCH ONTO THE SKIN ONCE A WEEK. 4 patch 7  . DEXILANT 60 MG capsule TAKE 1 CAPSULE BY MOUTH ONCE DAILY 90 capsule 1  . Multiple Vitamins-Minerals (MULTIVITAMIN WITH MINERALS) tablet Take 1 tablet by mouth daily.    . sertraline (ZOLOFT) 50 MG tablet Take 1 tablet (50 mg total) by mouth at bedtime. 90 tablet 3  . simvastatin (ZOCOR) 20 MG tablet Take 1 tablet (20 mg total) by mouth daily. 90 tablet 4   No current facility-administered medications for this visit.    ALLERGIES:  Allergies  Allergen Reactions  . Penicillins Swelling    Has patient had a PCN reaction causing immediate rash, facial/tongue/throat swelling, SOB or lightheadedness with hypotension: Yes Has patient had a PCN reaction causing severe rash involving mucus membranes or skin necrosis: No Has patient had a PCN reaction that required hospitalization: No Has patient had a PCN reaction occurring within the last 10 years: Yes If all of the above answers are "NO", then may proceed with Cephalosporin use.     PHYSICAL EXAM:  Performance status (ECOG): 0 - Asymptomatic  There were no vitals filed for this visit. Wt Readings from Last 3 Encounters:  01/22/20 87.1 kg (192 lb)  12/27/19 89.8 kg  (198 lb)  04/30/19 82.1 kg (181 lb)   Physical Exam Vitals reviewed.  Constitutional:      Appearance: Normal appearance. She is obese.  Neurological:     General: No focal deficit present.     Mental Status: She is alert and oriented to person, place, and time.  Psychiatric:        Mood and Affect: Mood normal.        Behavior: Behavior normal.     LABORATORY DATA:  I have reviewed the labs as listed.  CBC Latest Ref Rng & Units 02/06/2020 01/22/2020  06/01/2018  WBC 4.0 - 10.5 K/uL 6.2 6.1 5.5  Hemoglobin 12.0 - 15.0 g/dL 16.8(H) 15.7(H) 14.7  Hematocrit 36 - 46 % 51.1(H) 48.9(H) 45.7  Platelets 150 - 400 K/uL 308 304 308   CMP Latest Ref Rng & Units 09/28/2017 03/15/2017 03/14/2017  Glucose 65 - 99 mg/dL 89 128(H) 139(H)  BUN 8 - 27 mg/dL _0 Creatinine 0.57 - 1.00 mg/dL 0.98 0.87 0.92  Sodium 134 - 144 mmol/L 144 141 139  Potassium 3.5 - 5.2 mmol/L 4.9 4.8 4.1  Chloride 96 - 106 mmol/L 108(H) 109 107  CO2 20 - 29 mmol/L _1 Calcium 8.7 - 10.3 mg/dL 9.7 9.3 8.9  Total Protein 6.0 - 8.5 g/dL 6.9 7.1 6.8  Total Bilirubin 0.0 - 1.2 mg/dL 0.5 0.5 0.4  Alkaline Phos 39 - 117 IU/L 129(H) 103 104  AST 0 - 40 IU/L 19 66(H) 58(H)  ALT 0 - 32 IU/L 14 74(H) 61(H)      Component Value Date/Time   RBC 5.76 (H) 02/06/2020 0827   MCV 88.7 02/06/2020 0827   MCV 79 06/01/2018 0817   MCH 29.2 02/06/2020 0827   MCHC 32.9 02/06/2020 0827   RDW 12.2 02/06/2020 0827   RDW 23.0 (H) 06/01/2018 0817   LYMPHSABS 1.2 02/06/2020 0827   MONOABS 0.8 02/06/2020 0827   EOSABS 0.2 02/06/2020 0827   BASOSABS 0.1 02/06/2020 0827   Lab Results  Component Value Date   LDH 128 02/06/2020   LDH 127 01/22/2020    DIAGNOSTIC IMAGING:  I have independently reviewed the scans and discussed with the patient. DG BONE DENSITY (DXA)  Result Date: 01/30/2020 EXAM: DUAL X-RAY ABSORPTIOMETRY (DXA) FOR BONE MINERAL DENSITY IMPRESSION: Your patient Courtney Bradley completed a BMD test on 01/30/2020  using the [employer] (software version: 14.10) manufactured by [employer]. The following summarizes the results of our evaluation. Technologist: PATIENT BIOGRAPHICAL: Name: Courtney Bradley, Courtney Bradley Patient ID: [id number] Birth Date: 1951-05-11 Height: 66.0 in. Gender: Female Exam Date: 01/30/2020 Weight: 192.0 lbs. Indications: Caucasian, Follow up Osteopenia, Height Loss, History of Fracture (Adult), Parent Hip Fracture, Post Menopausal, Family Hist. (Parent hip fracture) Fractures: Coccyx, L-Spine Treatments: Calcium, Multivitamin DENSITOMETRY RESULTS: Site         Region     Measured Date Measured Age WHO Classification Young Adult T-score BMD         %Change vs. Previous Significant Change (*) DualFemur Neck Left 01/30/2020 68.0 Oste[age] -1.5 0.835 g/cm2 -5.1% Yes DualFemur Neck Left 03/02/2017 65.1 Osteopenia -1.1 0.880 g/cm2 3.9% - DualFemur Neck Left 01/26/2011 59.0 Osteopenia -1.4 0.847 g/cm2 - - DualFemur Total Mean 01/30/2020 68.0 Osteopenia -1.2 0.857 g/cm2 -6.5% Yes DualFemur Total Mean 03/02/2017 65.1 Normal -0.7 0.917 g/cm2 9.8% Yes DualFemur Total Mean 01/26/2011 59.0 Osteopenia -1.4 0.835 g/cm2 - - Left Forearm Radius 33% 01/30/2020 68.0 Normal 0.3 0.731 g/cm2 8.6% Yes Left Forearm Radius 33% 03/02/2017 65.1 Normal -0.6 0.674 g/cm2 - - ASSESSMENT: The BMD measured at Femur Neck Left is 0.835 g/cm2 with a T-score of -1.5. This patient is considered osteopenic according to [facility]) criteria. The scan quality is good. Compared with the prior study on 03/02/17, the BMD of the total mean shows a statistically significant decrease. World Pharmacologist [facility]) criteria for post-menopausal, Caucasian Women: Normal:       T-score at or above -1 SD Osteopenia:   T-score between -1 and -2.5 SD Osteoporosis: T-score at or below -2.5  SD RECOMMENDATIONS: 1. All patients should optimize calcium and vitamin D intake. 2. Consider FDA-approved medical therapies in  postmenopausal women and med aged 57 years and older, based on the following: a. A hip or vertebral (clinical or morphometric) fracture b. T-score< -2.5 at the femoral neck or spine after appropriate evaluation to exclude secondary causes c. Low bone mass (T-score between -1.0 and -2.5 at the femoral neck or spine) and a 10-year probability of a hip fracture > 3% or a 10-year probability of a major osteoporosis-related fracture > 20% based on the [place]-adapted WHO algorithm d. Clinician judgment and/or patient preferences may indicate treatment for people with 10-year fracture probabilities above or below these levels FOLLOW-UP: People with diagnosed cases of osteoporosis or at high risk for fracture should have regular bone mineral density tests. For patients eligible for Medicare, routine testing is allowed once every 2 years. The testing frequency can be increased to one year for patients who have rapidly progressing disease, those who are receiving or discontinuing medical therapy to restore bone mass, or have additional risk factors. I have reviewed this report, and agree with the above findings. [facility] Your patient EVANTHIA MAUND completed a FRAX assessment on 01/30/2020 using the [place] (analysis version: 14.10) manufactured by [employer]. The following summarizes the results of our evaluation. PATIENT BIOGRAPHICAL: Name: Courtney Bradley, Courtney Bradley Patient ID: [id number] Birth Date: 1951/04/15 Height:    66.0 in. Gender:     [age]    [age]       Weight:    192.0 lbs. Ethnicity:  White                            Exam Date: 01/30/2020 FRAX* RESULTS:  (version: 3.5) 10-year Probability of Fracture1 Major Osteoporotic Fracture2 Hip Fracture 24.5% 2.8% Population: [place] (Caucasian) Risk Factors: History of Fracture (Adult), Family Hist. (Parent hip fracture) Based on DualFemur (Left) Neck BMD 1 -The 10-year probability of fracture may be lower than reported if the patient  has received treatment. 2 -Major Osteoporotic Fracture: Clinical Spine, Forearm, Hip or Shoulder *FRAX is a Materials engineer of the [employer] for Metabolic Bone Disease, a [place] (WHO) [facility]. ASSESSMENT: The probability of a major osteoporotic fracture is 24.5% within the next ten years. The probability of a hip fracture is 2.8% within the next ten years. Electronically Signed   By: Rolm Baptise M.D.   On: 01/30/2020 13:19     ASSESSMENT:  1.  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 Ria Comment office shows hemoglobin 17 and hematocrit of 50.7.  RBC count was elevated at 5.79.  White count was 4.7 and platelet count was 287.  Differential was normal. -She is a non-smoker.  No vasomotor symptoms or erythromelalgia.  No prior history of thrombosis. -CTAP on 06/30/2014 shows left hydronephrosis secondary to a stone at the left UPJ.  Tiny nonobstructing left renal calculus.  Left adrenal adenoma present.  Spleen and liver are normal.  2.  Elevated alk phos: -Bone scan in November 2012 showed mild increased uptake in the left sacral ala corresponding to insufficiency fracture on MRI.   PLAN:  1.  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 -Serum erythropoietin was 6.8. -CBC on 01/22/2020 shows hematocrit 48.9 and 51.1 on 02/06/2020.  White count and platelets was normal. -Further review of labs showed elevated hematocrit this year only. -She does not have any vasomotor symptoms.  As her erythropoietin was in the normal range, I did not recommend any further work-up including bone marrow biopsy at this time. -We will monitor her CBC in 6 months.  If there is any significant changes will consider bone marrow biopsy.  2.  Elevated alkaline phosphatase: -SPEP is negative.  Orders placed this encounter:  No orders of the defined types were  placed in this encounter.    Derek Jack, MD [place] [phone]   I, Milinda Antis, am acting as a scribe for Dr. Sanda Linger.  I, Derek Jack MD, have reviewed the above documentation for accuracy and completeness, and I agree with the above.

## 2020-03-11 DIAGNOSIS — N390 Urinary tract infection, site not specified: Secondary | ICD-10-CM | POA: Diagnosis not present

## 2020-03-17 MED FILL — SERTRALINE HCL 50 MG TABLET: 50 | 90 days supply | Qty: 90 | Fill #2

## 2020-03-17 MED FILL — DEXILANT DR 60 MG CAPSULE: 60 | 90 days supply | Qty: 90 | Fill #1

## 2020-03-17 MED FILL — CLIMARA PRO PATCH: 0.045-0.015 | 28 days supply | Qty: 4 | Fill #5

## 2020-03-19 ENCOUNTER — Other Ambulatory Visit (HOSPITAL_COMMUNITY): Payer: Self-pay | Admitting: Internal Medicine

## 2020-03-19 DIAGNOSIS — Z0000 Encounter for general adult medical examination without abnormal findings: Secondary | ICD-10-CM

## 2020-03-23 DIAGNOSIS — N3000 Acute cystitis without hematuria: Secondary | ICD-10-CM | POA: Diagnosis not present

## 2020-04-03 ENCOUNTER — Other Ambulatory Visit: Payer: 59

## 2020-04-21 MED FILL — CLIMARA PRO PATCH: 0.045-0.015 | 28 days supply | Qty: 4 | Fill #6

## 2020-05-22 MED FILL — CLIMARA PRO PATCH: 0.045-0.015 | 28 days supply | Qty: 4 | Fill #7

## 2020-06-09 DIAGNOSIS — H2511 Age-related nuclear cataract, right eye: Secondary | ICD-10-CM | POA: Diagnosis not present

## 2020-06-09 DIAGNOSIS — H25043 Posterior subcapsular polar age-related cataract, bilateral: Secondary | ICD-10-CM | POA: Diagnosis not present

## 2020-06-09 DIAGNOSIS — H25013 Cortical age-related cataract, bilateral: Secondary | ICD-10-CM | POA: Diagnosis not present

## 2020-06-09 DIAGNOSIS — H18413 Arcus senilis, bilateral: Secondary | ICD-10-CM | POA: Diagnosis not present

## 2020-06-09 DIAGNOSIS — H2513 Age-related nuclear cataract, bilateral: Secondary | ICD-10-CM | POA: Diagnosis not present

## 2020-06-15 ENCOUNTER — Other Ambulatory Visit: Payer: Self-pay | Admitting: Adult Health

## 2020-06-15 MED FILL — SERTRALINE HCL 50 MG TABLET: 50 | 90 days supply | Qty: 90 | Fill #3

## 2020-06-15 MED FILL — SIMVASTATIN 20 MG TABLET: 20 | 90 days supply | Qty: 90 | Fill #2

## 2020-06-15 MED FILL — DEXILANT DR 60 MG CAPSULE: 60 | 90 days supply | Qty: 90 | Fill #0

## 2020-06-19 ENCOUNTER — Ambulatory Visit (INDEPENDENT_AMBULATORY_CARE_PROVIDER_SITE_OTHER): Payer: Self-pay

## 2020-06-19 ENCOUNTER — Other Ambulatory Visit: Payer: Self-pay

## 2020-06-19 VITALS — BP 114/72 | HR 65 | Temp 97.8°F | Ht 64.0 in | Wt 190.0 lb

## 2020-06-19 DIAGNOSIS — Z719 Counseling, unspecified: Secondary | ICD-10-CM

## 2020-06-24 ENCOUNTER — Other Ambulatory Visit: Payer: Self-pay | Admitting: Adult Health

## 2020-06-25 ENCOUNTER — Other Ambulatory Visit: Payer: Self-pay | Admitting: Women's Health

## 2020-06-25 MED FILL — CLIMARA PRO PATCH: 0.045-0.015 | 28 days supply | Qty: 4 | Fill #0

## 2020-07-20 ENCOUNTER — Ambulatory Visit (INDEPENDENT_AMBULATORY_CARE_PROVIDER_SITE_OTHER): Payer: Self-pay

## 2020-07-20 ENCOUNTER — Other Ambulatory Visit: Payer: Self-pay

## 2020-07-20 VITALS — BP 122/81 | HR 77 | Temp 98.2°F

## 2020-07-20 DIAGNOSIS — Z719 Counseling, unspecified: Secondary | ICD-10-CM

## 2020-07-30 ENCOUNTER — Other Ambulatory Visit: Payer: Self-pay | Admitting: Women's Health

## 2020-07-30 ENCOUNTER — Other Ambulatory Visit (HOSPITAL_COMMUNITY): Payer: Self-pay | Admitting: Ophthalmology

## 2020-07-30 MED FILL — BESIVANCE 0.6% SUSP: 0.6 | 33 days supply | Qty: 5 | Fill #0

## 2020-07-30 MED FILL — PROLENSA 0.07% EYE DROPS: 0.07 | 45 days supply | Qty: 3 | Fill #0

## 2020-08-04 ENCOUNTER — Other Ambulatory Visit: Payer: Self-pay | Admitting: Adult Health

## 2020-08-04 ENCOUNTER — Other Ambulatory Visit: Payer: Self-pay | Admitting: Women's Health

## 2020-08-04 MED FILL — CLIMARA PRO PATCH: 0.045-0.015 | 28 days supply | Qty: 4 | Fill #0

## 2020-08-21 ENCOUNTER — Other Ambulatory Visit (HOSPITAL_COMMUNITY): Payer: Self-pay | Admitting: Ophthalmology

## 2020-08-21 MED FILL — DIFLUPREDNATE 0.05 % EMUL: 0.05 | 19 days supply | Qty: 5 | Fill #0

## 2020-08-27 ENCOUNTER — Inpatient Hospital Stay (HOSPITAL_COMMUNITY): Payer: 59

## 2020-08-28 MED FILL — CLIMARA PRO PATCH: 0.045-0.015 | 28 days supply | Qty: 4 | Fill #1

## 2020-08-31 DIAGNOSIS — H52201 Unspecified astigmatism, right eye: Secondary | ICD-10-CM | POA: Diagnosis not present

## 2020-08-31 DIAGNOSIS — H2511 Age-related nuclear cataract, right eye: Secondary | ICD-10-CM | POA: Diagnosis not present

## 2020-09-01 ENCOUNTER — Other Ambulatory Visit (HOSPITAL_COMMUNITY): Payer: Self-pay | Admitting: Ophthalmology

## 2020-09-01 DIAGNOSIS — H2512 Age-related nuclear cataract, left eye: Secondary | ICD-10-CM | POA: Diagnosis not present

## 2020-09-03 ENCOUNTER — Other Ambulatory Visit (HOSPITAL_COMMUNITY): Payer: 59

## 2020-09-03 ENCOUNTER — Ambulatory Visit (HOSPITAL_COMMUNITY): Payer: 59 | Admitting: Hematology

## 2020-09-04 MED FILL — PROLENSA 0.07% EYE DROPS: 0.07 | 45 days supply | Qty: 3 | Fill #0

## 2020-09-05 MED FILL — BESIVANCE 0.6% SUSP: 0.6 | 33 days supply | Qty: 5 | Fill #0

## 2020-09-07 MED FILL — DIFLUPREDNATE 0.05 % EMUL: 0.05 | 19 days supply | Qty: 5 | Fill #0

## 2020-09-08 NOTE — Patient Instructions (Signed)
Pt will use moisturizer/sunscreen & avoid retinol products and harsh scrubs. Continue with good skin care routine & call for any concerns. F/u in 4 weeks

## 2020-09-08 NOTE — Progress Notes (Signed)
Preoperative Dx: facial aging/hyperpigmentation  Postoperative Dx:  same  Procedure: laser to face/cheeks/forehead Hyperpigmented areas  Anesthesia: EMLA -pt applied 30 mins prior to procedure  Description of Procedure:  Risks and complications were explained to the patient. Consent was confirmed and signed. Time out was called and all information was confirmed to be correct. The area  was prepped with alcohol and wiped dry.  The IPL laser was set at the following:  skin -1/ suntan light -  7 J-  Decreased to 6 J to forehead  The patient tolerated the procedure well and there were no complications.  Aloe applied  She is reminded to protect skin with sunscreen & moisturizer & avoid retina products & no abrasive scrubs for approx. 7 to 10 days  She is reminded that she may experience redness/peeling & irritation   She will call for any concerns

## 2020-09-08 NOTE — Progress Notes (Signed)
Preoperative Dx: facial aging/hyperpigmentation  Postoperative Dx:  same  Procedure: laser to face  Anesthesia: EMLA -pt applied 30 mins prior to procedure  Description of Procedure:  Risks and complications were explained to the patient. Consent was confirmed and signed. Time out was called and all information was confirmed to be correct. The area was prepped with alcohol and wiped dry.  The IPL laser was set at the following:  skin -1/ suntan light -  6J/cm2    The entire face was lasered The patient tolerated the procedure well and there were no complications.  Aloe applied & provided ice for pt to use She is reminded to protect skin with sunscreen & moisturizer & avoid retina products & no abrasive scrubs for approx. 7 to 10 days  She is reminded that she may experience redness/peeling & irritation Patient to f/u in 4 weeks-  She will call for any concerns

## 2020-09-08 NOTE — Patient Instructions (Signed)
Pt will use moisturizer/sunscreen to face. Avoid scrubs/retinol products for approx 2 weeks Continue skin care routine after skin irritation/redness subsides Call for any concerns-otherwise f/u as needed

## 2020-09-09 MED FILL — DEXLANSOPRAZOLE 60 MG CPDR: 60 | 30 days supply | Qty: 30 | Fill #1

## 2020-09-14 ENCOUNTER — Other Ambulatory Visit: Payer: Self-pay | Admitting: Adult Health

## 2020-09-14 MED FILL — SIMVASTATIN 20 MG TABLET: 20 | 90 days supply | Qty: 90 | Fill #0

## 2020-09-14 MED FILL — SERTRALINE HCL 50 MG TABLET: 50 | 90 days supply | Qty: 90 | Fill #0

## 2020-09-21 DIAGNOSIS — H2512 Age-related nuclear cataract, left eye: Secondary | ICD-10-CM | POA: Diagnosis not present

## 2020-09-25 MED FILL — CLIMARA PRO PATCH: 0.045-0.015 | 28 days supply | Qty: 4 | Fill #2

## 2020-09-27 ENCOUNTER — Other Ambulatory Visit: Payer: Self-pay

## 2020-09-27 ENCOUNTER — Emergency Department (HOSPITAL_COMMUNITY)
Admission: EM | Admit: 2020-09-27 | Discharge: 2020-09-27 | Disposition: A | Discharge status: Home / Self Care | Payer: 59 | Attending: Emergency Medicine | Admitting: Emergency Medicine

## 2020-09-27 ENCOUNTER — Emergency Department (HOSPITAL_COMMUNITY): Payer: 59

## 2020-09-27 ENCOUNTER — Ambulatory Visit
Admission: EM | Admit: 2020-09-27 | Discharge: 2020-09-27 | Disposition: A | Discharge status: Other Acute Inpatient Hospital | Payer: 59

## 2020-09-27 ENCOUNTER — Encounter (HOSPITAL_COMMUNITY): Payer: Self-pay | Admitting: *Deleted

## 2020-09-27 DIAGNOSIS — W19XXXA Unspecified fall, initial encounter: Secondary | ICD-10-CM

## 2020-09-27 DIAGNOSIS — R110 Nausea: Secondary | ICD-10-CM | POA: Diagnosis not present

## 2020-09-27 DIAGNOSIS — S299XXA Unspecified injury of thorax, initial encounter: Secondary | ICD-10-CM | POA: Diagnosis present

## 2020-09-27 DIAGNOSIS — R42 Dizziness and giddiness: Secondary | ICD-10-CM | POA: Insufficient documentation

## 2020-09-27 DIAGNOSIS — W01198A Fall on same level from slipping, tripping and stumbling with subsequent striking against other object, initial encounter: Secondary | ICD-10-CM | POA: Diagnosis not present

## 2020-09-27 DIAGNOSIS — Y9301 Activity, walking, marching and hiking: Secondary | ICD-10-CM | POA: Diagnosis not present

## 2020-09-27 DIAGNOSIS — S2242XA Multiple fractures of ribs, left side, initial encounter for closed fracture: Secondary | ICD-10-CM | POA: Diagnosis not present

## 2020-09-27 DIAGNOSIS — Z043 Encounter for examination and observation following other accident: Secondary | ICD-10-CM | POA: Diagnosis not present

## 2020-09-27 LAB — URINALYSIS, ROUTINE W REFLEX MICROSCOPIC
Bilirubin Urine: NEGATIVE
Glucose, UA: NEGATIVE mg/dL
Hgb urine dipstick: NEGATIVE
Ketones, ur: 20 mg/dL — AB
Leukocytes,Ua: NEGATIVE
Nitrite: NEGATIVE
Protein, ur: NEGATIVE mg/dL
Specific Gravity, Urine: 1.028 (ref 1.005–1.030)
pH: 5 (ref 5.0–8.0)

## 2020-09-27 LAB — CBC WITH DIFFERENTIAL/PLATELET
Abs Immature Granulocytes: 0.13 10*3/uL — ABNORMAL HIGH (ref 0.00–0.07)
Basophils Absolute: 0.1 10*3/uL (ref 0.0–0.1)
Basophils Relative: 1 %
Eosinophils Absolute: 0 10*3/uL (ref 0.0–0.5)
Eosinophils Relative: 0 %
HCT: 51.1 % — ABNORMAL HIGH (ref 36.0–46.0)
Hemoglobin: 17 g/dL — ABNORMAL HIGH (ref 12.0–15.0)
Immature Granulocytes: 1 %
Lymphocytes Relative: 5 %
Lymphs Abs: 0.7 10*3/uL (ref 0.7–4.0)
MCH: 29 pg (ref 26.0–34.0)
MCHC: 33.3 g/dL (ref 30.0–36.0)
MCV: 87.1 fL (ref 80.0–100.0)
Monocytes Absolute: 0.7 10*3/uL (ref 0.1–1.0)
Monocytes Relative: 5 %
Neutro Abs: 11.7 10*3/uL — ABNORMAL HIGH (ref 1.7–7.7)
Neutrophils Relative %: 88 %
Platelets: 274 10*3/uL (ref 150–400)
RBC: 5.87 MIL/uL — ABNORMAL HIGH (ref 3.87–5.11)
RDW: 12.6 % (ref 11.5–15.5)
WBC: 13.3 10*3/uL — ABNORMAL HIGH (ref 4.0–10.5)
nRBC: 0 % (ref 0.0–0.2)

## 2020-09-27 LAB — COMPREHENSIVE METABOLIC PANEL
ALT: 24 U/L (ref 0–44)
AST: 28 U/L (ref 15–41)
Albumin: 3.9 g/dL (ref 3.5–5.0)
Alkaline Phosphatase: 112 U/L (ref 38–126)
Anion gap: 9 (ref 5–15)
BUN: 21 mg/dL (ref 8–23)
CO2: 24 mmol/L (ref 22–32)
Calcium: 10.4 mg/dL — ABNORMAL HIGH (ref 8.9–10.3)
Chloride: 103 mmol/L (ref 98–111)
Creatinine, Ser: 0.8 mg/dL (ref 0.44–1.00)
GFR, Estimated: >60 mL/min (ref >60)
Glucose, Bld: 105 mg/dL — ABNORMAL HIGH (ref 70–99)
Potassium: 3.9 mmol/L (ref 3.5–5.1)
Sodium: 136 mmol/L (ref 135–145)
Total Bilirubin: 1.3 mg/dL — ABNORMAL HIGH (ref 0.3–1.2)
Total Protein: 7.5 g/dL (ref 6.5–8.1)

## 2020-09-27 LAB — CBG MONITORING, ED: Glucose-Capillary: 87 mg/dL (ref 70–99)

## 2020-09-27 MED ORDER — OXYCODONE-ACETAMINOPHEN 5-325 MG PO TABS
1.0000 | ORAL_TABLET | Freq: Once | ORAL | Status: AC
  Administered 2020-09-27: 1 via ORAL
  Filled 2020-09-27: qty 1

## 2020-09-27 MED ORDER — MORPHINE SULFATE (PF) 4 MG/ML IV SOLN
4.0000 mg | Freq: Once | INTRAVENOUS | Status: AC
Start: 2020-09-27 — End: 2020-09-27
  Administered 2020-09-27: 4 mg via INTRAVENOUS
  Filled 2020-09-27: qty 1

## 2020-09-27 MED ORDER — SODIUM CHLORIDE 0.9 % IV BOLUS
1000.0000 mL | Freq: Once | INTRAVENOUS | Status: AC
  Administered 2020-09-27: 1000 mL via INTRAVENOUS

## 2020-09-27 MED ORDER — OXYCODONE-ACETAMINOPHEN 5-325 MG PO TABS: 1.0000 | ORAL_TABLET | Freq: Three times a day (TID) | ORAL | 0 refills | Status: DC | PRN

## 2020-09-27 MED ORDER — IOHEXOL 300 MG/ML  SOLN
75.0000 mL | Freq: Once | INTRAMUSCULAR | Status: AC | PRN
  Administered 2020-09-27: 75 mL via INTRAVENOUS

## 2020-09-27 MED ORDER — SODIUM CHLORIDE 0.9 % IV SOLN: INTRAVENOUS | Status: DC

## 2020-09-27 NOTE — Discharge Instructions (Addendum)
   You are seen today for fall, as discussed I think this could be in combination of you being dehydrated. I want you to follow-up with your primary care about this. Please follow-up in the next couple of days. In regards to your rib fractures, you see attached instructions. I want you to follow-up with the orthopedic doctor, schedule an appointment below. Do know that these take 1 to 3 months to heal, take ibuprofen or Tylenol instructed on the bottle for pain.  If the pain becomes severe you can take the Percocet.  Do note that this is a narcotic and can cause sedation, be very careful with this medication in regards to falling.  I want you to continue to use the incentive spirometry as we discussed. If you have any new or worsening concerning symptoms please come back to the emergency department.  Please speak to your pharmacist about any new medications prescribed in regards to side effects or interactions. Get help right away if: You have difficulty breathing or you are short of breath. You develop a cough that does not stop, or you cough up thick or bloody sputum. You have nausea, vomiting, or pain in your abdomen. Your pain gets worse and medicine does not help.

## 2020-09-27 NOTE — ED Triage Notes (Signed)
Pt states hit her head on pavement.  + nausea, denies emesis.  Denies blurry vision.

## 2020-09-27 NOTE — ED Triage Notes (Addendum)
Pt fell while walking with a friend at 0930 this morning.  On left side.  Pt seen at Urgent Care and sent here due to dizziness. Pt with eye surgery.

## 2020-09-27 NOTE — ED Notes (Signed)
Pt does not have a Psychologist, forensic. No interrogation needed.

## 2020-09-27 NOTE — ED Provider Notes (Signed)
Courtney Bradley   CSN: [id number] Arrival date & time: 09/27/20  1056     History Chief Complaint  Patient presents with  . Fall  . Dizziness    Fall this am    Courtney Bradley is a 70 y.o. female with pertient past medical history of GERD, obesity, dyslipidemia, osteopenia that presents emerge department today for fall.  Patient states that she fell while walking with her friend this morning.  Patient states that she recently had cataract surgery a week ago, was told not to walk until a week and passed.  Patient states that it had been almost a week today, therefore she did a 4 mile walk with her friend.  Patient states that they climbed up a steep hill and on the way down she states that she started to go into a slow jog on accident and could not stop.  States that she knew she was going to fell and fell onto her left side.  Only complaining of rib pain on the left side, no upper or lower extremity pain.  Patient states that she did hit her head, no LOC.  Patient states that she is mainly complaining of left rib pain.  Denies any chest pain or shortness of breath.  Patient states that after she fell she was dizzy and slightly nauseous, did not vomit.  States that while she was in the waiting room she was nauseous and had some dizziness, however this has resolved while sitting in this bed.  Denies any blurry vision or eye pain, no complications with cataract surgery.  Denies any fevers, chills, abdominal pain, back pain, neck pain.  Patient does have slight headache where she hit her head.  States that she was in her normal health before this, states that she is unsure why she could not stop running.  Prior to this she did not have any symptoms of dizziness.  Denies any cardiac history.  States that she is generally healthy.  Patient states that she did go out to dinner last night, had tw alcoholic drinks, states that she has not been drinking water for the past  couple days because she does not like to drink water.  No other complaints at this time.  HPI     Past Medical History:  Diagnosis Date  . 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 Elevated cholesterol 11/28/2016  . GERD (gastroesophageal reflux disease)   . History of kidney stones   . Hormone replacement therapy (HRT) 09/19/2014  . Obesity   . Osteopenia   . Pneumonia   . PONV (postoperative nausea and vomiting)     Patient Active Problem List   Diagnosis Date Noted  . Erythrocytosis 01/22/2020  . History of bariatric surgery 12/27/2019  . Encounter for well woman exam with routine gynecological exam 12/27/2019  . Gastroesophageal reflux disease 12/22/2017  . Screening for colorectal cancer 12/22/2017  . Encounter for counseling 12/22/2017  . Morbid obesity ([place]) 03/14/2017  . Osteopenia 03/14/2017  . Dyslipidemia 03/14/2017  . GERD (gastroesophageal reflux disease) 03/14/2017  . Elevated cholesterol 11/28/2016  . Special screening for malignant neoplasms, colon 08/11/2016  . Well woman exam with routine gynecological exam 07/22/2016  . Weight gain 07/22/2016  . Hormone replacement therapy (HRT) 09/19/2014  . Fatigue fracture of vertebra of sacral and sacrococcygeal region 01/26/2011  . NAIL DISORDER 06/12/2008  . FX CLOSED SACRUM/COCCYX 06/18/2007    Past Surgical History:  Procedure Laterality Date  .  CHOLECYSTECTOMY    . COLONOSCOPY N/A 11/17/2016   Procedure: COLONOSCOPY;  Surgeon: Rogene Houston, MD;  Location: [facility];  Service: Endoscopy;  Laterality: N/A;  830  . CORRECTION HAMMER TOE Bilateral    2nd  . EYE SURGERY    . GALLBLADDER SURGERY    . LAPAROSCOPIC GASTRIC SLEEVE RESECTION WITH HIATAL HERNIA REPAIR N/A 03/14/2017   Procedure: LAPAROSCOPIC GASTRIC SLEEVE RESECTION WITH HIATAL HERNIA REPAIR;  Surgeon: Greer Pickerel, MD;  Location: [facility];  Service: General;  Laterality: N/A;  . LITHOTRIPSY    . neck lift    . UPPER GI ENDOSCOPY  03/14/2017   Procedure:  UPPER GI ENDOSCOPY;  Surgeon: Greer Pickerel, MD;  Location: [facility];  Service: General;;     OB History    Gravida  2   Para  2   Term      Preterm      AB      Living  2     SAB      IAB      Ectopic      Multiple      Live Births              Family History  Problem Relation Age of Onset  . Heart disease Mother   . Diabetes Mother   . Coronary artery disease Mother   . Cancer Mother        Breast and thyroid cancer  . Alzheimer's disease Father   . Cancer Father        prostate  . Cancer Maternal Aunt        breast  . Cancer Paternal Aunt        breast  . Cancer Maternal Aunt        breast  . Heart disease Paternal Grandmother   . Suicidality Maternal Grandfather   . Obesity Daughter   . Diabetes Daughter   . Hypertension Daughter   . Hypercholesterolemia Brother   . Diabetes Half-Sister   . Colon cancer Neg Hx     Social History   Tobacco Use  . Smoking status: Never Smoker  . Smokeless tobacco: Never Used  Vaping Use  . Vaping Use: Never used  Substance Use Topics  . Alcohol use: Yes    Alcohol/week: 0.0 standard drinks    Comment: very seldom  . Drug use: No    Home Medications Prior to Admission medications   Medication Sig Start Date End Date Taking? Authorizing Provider  BESIVANCE 0.6 % SUSP Place 1 drop into the left eye 3 (three) times daily. 09/05/20  Yes [provider]  Calcium Carbonate (CALCIUM 600 PO) Take 1 tablet by mouth every evening.    Yes [provider]  CLIMARA PRO 0.045-0.015 MG/DAY PLACE 1 PATCH ONTO THE SKIN ONCE A WEEK. Patient taking differently: Place 1 patch onto the skin once a week. 08/04/20  Yes Estill Dooms, NP  DEXILANT 60 MG capsule TAKE 1 CAPSULE BY MOUTH ONCE DAILY 06/15/20  Yes Derrek Monaco A, NP  Difluprednate 0.05 % EMUL Place 1 drop into the left eye 4 (four) times daily. 09/07/20  Yes [provider]  ibuprofen (ADVIL) 200 MG tablet Take 400 mg by mouth  every 6 (six) hours as needed.   Yes [provider]  Multiple Vitamins-Minerals (MULTIVITAMIN WITH MINERALS) tablet Take 1 tablet by mouth daily.   Yes [provider]  oxyCODONE-acetaminophen (PERCOCET/ROXICET) 5-325 MG tablet Take 1 tablet by  mouth every 8 (eight) hours as needed for severe pain. 09/27/20  Yes Talaysia Pinheiro, PA-C  PROLENSA 0.07 % SOLN Place 1 drop into the left eye 4 (four) times daily. 09/04/20  Yes [provider]  sertraline (ZOLOFT) 50 MG tablet TAKE 1 TABLET BY MOUTH AT BEDTIME 09/14/20  Yes Derrek Monaco A, NP  simvastatin (ZOCOR) 20 MG tablet TAKE 1 TABLET (20 MG TOTAL) BY MOUTH DAILY. 09/14/20  Yes Estill Dooms, NP    Allergies    Penicillins  Review of Systems   Review of Systems  Constitutional: Negative for chills, diaphoresis, fatigue and fever.  HENT: Negative for congestion, sore throat and trouble swallowing.   Eyes: Negative for pain and visual disturbance.  Respiratory: Negative for cough, shortness of breath and wheezing.   Cardiovascular: Negative for chest pain, palpitations and leg swelling.  Gastrointestinal: Positive for nausea. Negative for abdominal distention, abdominal pain, diarrhea and vomiting.  Genitourinary: Negative for difficulty urinating.  Musculoskeletal: Positive for arthralgias. Negative for back pain, neck pain and neck stiffness.  Skin: Negative for pallor.  Neurological: Positive for dizziness. Negative for facial asymmetry, speech difficulty, weakness, light-headedness, numbness and headaches.  Psychiatric/Behavioral: Negative for confusion.    Physical Exam Updated Vital Signs BP 129/76   Pulse 73   Temp (!) 96 F (35.6 C) (Temporal) Comment (Src): unable to obtain oral temp.  Resp 17   Ht 5' 6.5" (1.689 m)   Wt 87.5 kg   SpO2 96%   BMI 30.68 kg/m   Physical Exam Constitutional:      General: She is not in acute distress.    Appearance: Normal appearance. She is not  ill-appearing, toxic-appearing or diaphoretic.  HENT:     Head: Normocephalic and atraumatic.     Comments: No laceration    Mouth/Throat:     Mouth: Mucous membranes are moist.     Pharynx: Oropharynx is clear.  Eyes:     General: No scleral icterus.    Extraocular Movements: Extraocular movements intact.     Pupils: Pupils are equal, round, and reactive to light.  Cardiovascular:     Rate and Rhythm: Normal rate and regular rhythm.     Pulses: Normal pulses.     Heart sounds: Normal heart sounds.  Pulmonary:     Effort: Pulmonary effort is normal. No respiratory distress.     Breath sounds: Normal breath sounds. No stridor. No wheezing, rhonchi or rales.  Chest:     Chest wall: No tenderness.       Comments: Patient with tenderness to left ribs as depicted above, no flail chest.  No open fractures.  No ecchymosis, erythema or warmth. Abdominal:     General: Abdomen is flat. There is no distension.     Palpations: Abdomen is soft.     Tenderness: There is no abdominal tenderness. There is no guarding or rebound.  Musculoskeletal:        General: No swelling or tenderness. Normal range of motion.     Cervical back: Normal range of motion and neck supple. No rigidity.     Right lower leg: No edema.     Left lower leg: No edema.     Comments: Bilateral lower and upper extremities with normal range of motion.  Normal leg raise bilaterally.  Bilateral PT pulses 2+.  Skin:    General: Skin is warm and dry.     Capillary Refill: Capillary refill takes less than 2 seconds.  Coloration: Skin is not pale.  Neurological:     General: No focal deficit present.     Mental Status: She is alert and oriented to person, place, and time.     Comments: Alert. Clear speech. No facial droop. CNIII-XII grossly intact. Bilateral upper and lower extremities' sensation grossly intact. 5/5 symmetric strength with grip strength and with plantar and dorsi flexion bilaterally.. Normal finger to nose  bilaterally. Negative pronator drift. Negative Romberg sign. Gait is steady and intact    Psychiatric:        Mood and Affect: Mood normal.        Behavior: Behavior normal.     ED Results / Procedures / Treatments   Labs (all labs ordered are listed, but only abnormal results are displayed) Labs Reviewed  CBC WITH DIFFERENTIAL/PLATELET - Abnormal; Notable for the following components:      Result Value   WBC 13.3 (*)    RBC 5.87 (*)    Hemoglobin 17.0 (*)    HCT 51.1 (*)    Neutro Abs 11.7 (*)    Abs Immature Granulocytes 0.13 (*)    All other components within normal limits  COMPREHENSIVE METABOLIC PANEL - Abnormal; Notable for the following components:   Glucose, Bld 105 (*)    Calcium 10.4 (*)    Total Bilirubin 1.3 (*)    All other components within normal limits  URINALYSIS, ROUTINE W REFLEX MICROSCOPIC - Abnormal; Notable for the following components:   Ketones, ur 20 (*)    All other components within normal limits  CBG MONITORING, ED    EKG EKG Interpretation  Date/Time:  Sunday September 27 2020 11:13:15 EST Ventricular Rate:  70 PR Interval:  158 QRS Duration: 88 QT Interval:  400 QTC Calculation: 432 R Axis:   23 Text Interpretation: Normal sinus rhythm Nonspecific T wave abnormality Confirmed by Lajean Saver [phone]) on 09/27/2020 3:52:48 PM   Radiology DG Ribs Unilateral W/Chest Left  Result Date: 09/27/2020 CLINICAL DATA:  Acute left rib pain after fall today. EXAM: LEFT RIBS AND CHEST - 3+ VIEW COMPARISON:  January 20, 2017. FINDINGS: Mildly displaced fractures are seen involving the lateral portions of the left fourth, fifth and sixth ribs. There is no evidence of pneumothorax or pleural effusion. Both lungs are clear. Heart size and mediastinal contours are within normal limits. IMPRESSION: Mildly displaced left fourth, fifth and sixth rib fractures. Electronically Signed   By: Marijo Conception M.D.   On: 09/27/2020 13:38   CT HEAD WO CONTRAST  Result  Date: 09/27/2020 CLINICAL DATA:  Fall EXAM: CT HEAD WITHOUT CONTRAST TECHNIQUE: Contiguous axial images were obtained from the base of the skull through the vertex without intravenous contrast. COMPARISON:  None. FINDINGS: Brain: No evidence of acute infarction, hemorrhage, hydrocephalus, extra-axial collection or mass lesion/mass effect. Vascular: No hyperdense vessel or unexpected calcification. Skull: Normal. Negative for fracture or focal lesion. Sinuses/Orbits: Opacification of the visualized LEFT maxillary sinus. Other: LEFT forehead fat stranding. IMPRESSION: 1. No acute intracranial pathology. 2. LEFT forehead fat stranding. 3. Opacification of the visualized LEFT maxillary sinus. Electronically Signed   By: Valentino Saxon MD   On: 09/27/2020 13:39   CT Chest W Contrast  Result Date: 09/27/2020 CLINICAL DATA:  [place] this morning, left rib fractures EXAM: CT CHEST WITH CONTRAST TECHNIQUE: Multidetector CT imaging of the chest was performed during intravenous contrast administration. CONTRAST:  37mL OMNIPAQUE IOHEXOL 300 MG/ML  SOLN COMPARISON:  09/27/2020 FINDINGS: Cardiovascular: The heart is unremarkable  without pericardial effusion. No evidence of thoracic aortic aneurysm or dissection. Mild atherosclerosis of the aorta and coronary vasculature. Mediastinum/Nodes: There is mediastinal and bilateral hilar lymphadenopathy, nonspecific. Largest lymph node in the [facility] window measures 11 mm in short axis. Largest subcarinal lymph node measures 16 mm in short axis. The thyroid, trachea, and esophagus are grossly unremarkable. There is a small hiatal hernia. Lungs/Pleura: Hypoventilatory changes are seen at the lung bases, left greater than right. There may be some underlying subpleural scarring and fibrosis as well. No acute airspace disease, effusion, or pneumothorax. The central airways are patent. Upper Abdomen: No acute upper abdominal findings. Evidence of prior gastric surgery. 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 Stable left renal cyst. Musculoskeletal: There are acute displaced left lateral fifth, sixth, and seventh rib fractures. In addition, there are displaced left posterior eighth, ninth, and tenth rib fractures. Numerous other prior healed left rib fractures are incidentally noted. Reconstructed images demonstrate no additional findings. IMPRESSION: 1. Displaced left fifth through tenth rib fractures as above. 2. Pathologic mediastinal and hilar lymphadenopathy, of uncertain etiology. Appropriate clinical follow-up recommended. 3. Hypoventilatory changes at the lung bases. No effusion or pneumothorax. Electronically Signed   By: Randa Ngo M.D.   On: 09/27/2020 16:31   CT Cervical Spine Wo Contrast  Result Date: 09/27/2020 CLINICAL DATA:  Fall EXAM: CT CERVICAL SPINE WITHOUT CONTRAST TECHNIQUE: Multidetector CT imaging of the cervical spine was performed without intravenous contrast. Multiplanar CT image reconstructions were also generated. COMPARISON:  January 31, 2019 FINDINGS: Alignment: Normal. Skull base and vertebrae: No acute fracture. No primary bone lesion or focal pathologic process. Soft tissues and spinal canal: No prevertebral fluid or swelling. No visible canal hematoma. Disc levels: Intervertebral disc space height loss is moderate at C5-6 and mild at C4-5 and C6-7. Multilevel uncovertebral hypertrophy. Upper cervical spine facet arthropathy. Upper chest: Negative. Other: Multiple tonsilloliths. Multiple subcentimeter cervical lymph nodes bilaterally, nonspecific and similar in comparison to prior when accounting for differences in technique. IMPRESSION: No acute fracture or static subluxation of the cervical spine. Electronically Signed   By: Valentino Saxon MD   On: 09/27/2020 13:42    Procedures Procedures   Medications Ordered in ED Medications  sodium chloride 0.9 % bolus 1,000 mL (0 mLs Intravenous Stopped 09/27/20 1509)     And  0.9 %  sodium chloride infusion ( Intravenous Stopped 09/27/20 1747)  morphine 4 MG/ML injection 4 mg (4 mg Intravenous Given 09/27/20 1401)  iohexol (OMNIPAQUE) 300 MG/ML solution 75 mL (75 mLs Intravenous Contrast Given 09/27/20 1616)  oxyCODONE-acetaminophen (PERCOCET/ROXICET) 5-325 MG per tablet 1 tablet (1 tablet Oral Given 09/27/20 1742)    ED Course  I have reviewed the triage vital signs and the nursing notes.  Pertinent labs & imaging results that were available during my care of the patient were reviewed by me and considered in my medical decision making (see chart for details).    MDM Rules/Calculators/A&P                         Courtney Bradley is a 70 y.o. female with friend past medical history of GERD, obesity, dyslipidemia, osteopenia that presents emergency department today for fall.  No syncope.  Will obtain basic labs, x-ray of ribs and CT head and neck.  Initial interventions include fluids and pain medication.  ECG interpreted by me demonstrated normal sinus rhythm.  CXR interpreted by  me demonstrated rib fractures on the left side,-5 through 7 fracture.  No pneumothorax.  Will obtain CT scan to evaluate for further pulmonary damage.  Patient is satting at 100% on room air, no respiratory distress.  Labs demonstrated white count of 13.3, most likely reactive.  Hemoglobin of 17, patient states this is common for her.  CMP unremarkable.  Urinalysis unremarkable.  Orthostatics at first positive, after a liter and a half of fluids, orthostatics negative.  Upon reassessment patient states that she feels much better with morphine on board.  Given the above findings, my suspicion is that patient had mechanical fall, could also be exacerbated by orthostatic hypotension from dehydration, CT scan did show 5 through 10 rib fractures on the left side.  On repeat exam, patient still without any respiratory distress, spirometry ordered for patient, RT will explain patient how to use  this.  Patient follow-up with orthopedics, symptomatic treatment discussed. Patient with normal gait, to be discharged, pain managed. Has not felt dizzy while here or on ambulation, has been observed for 7 hours.   Doubt need for further emergent work up at this time. I explained the diagnosis and have given explicit precautions to return to the ER including for any other new or worsening symptoms. The patient understands and accepts the medical plan as it's been dictated and I have answered their questions. Discharge instructions concerning home care and prescriptions have been given. The patient is STABLE and is discharged to home in good condition.  The plan for this patient was discussed with Dr. Ashok Cordia, who voiced agreement and who oversaw evaluation and treatment of this patient.  Final Clinical Impression(s) / ED Diagnoses Final diagnoses:  Fall, initial encounter  Closed fracture of multiple ribs of left side, initial encounter    Rx / DC Orders ED Discharge Orders         Ordered    oxyCODONE-acetaminophen (PERCOCET/ROXICET) 5-325 MG tablet  Every 8 hours PRN        09/27/20 1736           Alfredia Client, PA-C 09/27/20 2253    Fredia Sorrow, MD 10/02/20 [phone]

## 2020-09-27 NOTE — ED Notes (Signed)
Pt ambulated to the restroom with minimal difficulty.

## 2020-09-29 DIAGNOSIS — S22000A Wedge compression fracture of unspecified thoracic vertebra, initial encounter for closed fracture: Secondary | ICD-10-CM | POA: Diagnosis not present

## 2020-10-05 ENCOUNTER — Encounter (HOSPITAL_COMMUNITY): Payer: Self-pay | Admitting: *Deleted

## 2020-10-09 ENCOUNTER — Other Ambulatory Visit: Payer: Self-pay

## 2020-10-09 ENCOUNTER — Ambulatory Visit (INDEPENDENT_AMBULATORY_CARE_PROVIDER_SITE_OTHER): Payer: 59 | Admitting: Pulmonary Disease

## 2020-10-09 ENCOUNTER — Encounter: Payer: Self-pay | Admitting: Pulmonary Disease

## 2020-10-09 DIAGNOSIS — R590 Localized enlarged lymph nodes: Secondary | ICD-10-CM

## 2020-10-09 DIAGNOSIS — D869 Sarcoidosis, unspecified: Secondary | ICD-10-CM | POA: Insufficient documentation

## 2020-10-09 NOTE — Progress Notes (Signed)
Subjective:    Patient ID: Courtney Bradley, female    DOB: 08/07/1951, 70 y.o.   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  On 2/6 while she was on her fourth mile she had a fall while walking downhill, she fractured her ribs and had an ER visit where CT with contrast was done.  This showed enlarged mediastinal and hilar lymphadenopathy.  To my review this also shows mild infiltrate on the left which is likely related to splinting and atelectasis due to rib fractures.  She reports pain on her left side.  She denies cough or wheezing.  She denies weight loss, fevers.  She has always lived in [place].  She denies prior pneumonias  PMH -renal stones, polycythemia, hemoglobin noted to be 15.7-16.8 in 01/2020 and 17 and 2/22. in 2019 this was as low as 14.7  Significant tests/ events reviewed  CT chest W con 09/2020 >> Displaced left fifth through tenth rib fractures  Pathologic mediastinal and hilar lymphadenopathy, of uncertain etiology. Subcarinal 16 mm, AP window 11 mm  Past Medical History:  Diagnosis Date  . 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 Elevated cholesterol 11/28/2016  . GERD (gastroesophageal reflux disease)   . History of kidney stones   . Hormone replacement therapy (HRT) 09/19/2014  . Obesity   . Osteopenia   . Pneumonia   . PONV (postoperative nausea and vomiting)    Past Surgical History:  Procedure Laterality Date  . CHOLECYSTECTOMY    . COLONOSCOPY N/A 11/17/2016   Procedure: COLONOSCOPY;  Surgeon: Rogene Houston, MD;  [facility];  Service: Endoscopy;  Laterality: N/A;  830  . CORRECTION HAMMER TOE Bilateral    2nd  . EYE SURGERY    . GALLBLADDER SURGERY    . LAPAROSCOPIC GASTRIC SLEEVE RESECTION WITH HIATAL HERNIA REPAIR N/A 03/14/2017   Procedure: LAPAROSCOPIC GASTRIC SLEEVE  RESECTION WITH HIATAL HERNIA REPAIR;  Surgeon: Greer Pickerel, MD;  Location: [facility];  Service: General;  Laterality: N/A;  . LITHOTRIPSY    . neck lift    . UPPER GI ENDOSCOPY  03/14/2017   Procedure: UPPER GI ENDOSCOPY;  Surgeon: Greer Pickerel, MD;  Location: [facility];  Service: General;;    Allergies  Allergen Reactions  . Penicillins Swelling    Has patient had a PCN reaction causing immediate rash, facial/tongue/throat swelling, SOB or lightheadedness with hypotension: Yes Has patient had a PCN reaction causing severe rash involving mucus membranes or skin necrosis: No Has patient had a PCN reaction that required hospitalization: No Has patient had a PCN reaction occurring within the last 10 years: Yes If all of the above answers are "NO", then may proceed with Cephalosporin use.     Social History   Socioeconomic History  . Marital status: Married    Spouse name: Not on file  . Number of children: 2  . Years of education: college  . Highest education level: Not on file  Occupational History  . Occupation: Engineer, drilling: [employer]  Tobacco Use  . Smoking status: Never Smoker  . Smokeless tobacco: Never Used  Vaping Use  . Vaping Use: Never used  Substance and Sexual Activity  . Alcohol use: Yes    Alcohol/week: 0.0 standard drinks    Comment:  very seldom  . Drug use: No  . Sexual activity: Not Currently    Birth control/protection: Post-menopausal  Other Topics Concern  . Not on file  Social History Narrative  . Not on file   Social Determinants of Health   Financial Resource Strain: Low Risk   . Difficulty of Paying Living Expenses: Not hard at all  Food Insecurity: Not on file  Transportation Needs: No Transportation Needs  . Lack of Transportation (Medical): No  . Lack of Transportation (Non-Medical): No  Physical Activity: Sufficiently Active  . Days of Exercise per Week: 4 days  . Minutes of Exercise per Session: 50 min  Stress: No Stress Concern  Present  . Feeling of Stress : Not at all  Social Connections: Socially Integrated  . Frequency of Communication with Friends and Family: More than three times a week  . Frequency of Social Gatherings with Friends and Family: Once a week  . Attends Religious Services: More than 4 times per year  . Active Member of Clubs or Organizations: Not on file  . Attends Archivist Meetings: 1 to 4 times per year  . Marital Status: Married  Human resources officer Violence: Not At Risk  . Fear of Current or Ex-Partner: No  . Emotionally Abused: No  . Physically Abused: No  . Sexually Abused: No    Family History  Problem Relation Age of Onset  . Heart disease Mother   . Diabetes Mother   . Coronary artery disease Mother   . Cancer Mother        Breast and thyroid cancer  . Alzheimer's disease Father   . Cancer Father        prostate  . Cancer Maternal Aunt        breast  . Cancer Paternal Aunt        breast  . Cancer Maternal Aunt        breast  . Heart disease Paternal Grandmother   . Suicidality Maternal Grandfather   . Obesity Daughter   . Diabetes Daughter   . Hypertension Daughter   . Hypercholesterolemia Brother   . Diabetes Half-Sister   . Colon cancer Neg Hx       Review of Systems Constitutional: negative for anorexia, fevers and sweats  Eyes: negative for irritation, redness and visual disturbance  Ears, nose, mouth, throat, and face: negative for earaches, epistaxis, nasal congestion and sore throat  Respiratory: negative for cough, dyspnea on exertion, sputum and wheezing  Cardiovascular: negative for , lower extremity edema, orthopnea, palpitations and syncope  Gastrointestinal: negative for abdominal pain, constipation, diarrhea, melena, nausea and vomiting  Genitourinary:negative for dysuria, frequency and hematuria  Hematologic/lymphatic: negative for bleeding, easy bruising and lymphadenopathy  Musculoskeletal:negative for arthralgias, muscle weakness and  stiff joints  Neurological: negative for coordination problems, gait problems, headaches and weakness  Endocrine: negative for diabetic symptoms including polydipsia, polyuria and weight loss     Objective:   Physical Exam   Gen. Pleasant, well-nourished, in no distress, normal affect ENT - no pallor,icterus, no post nasal drip Neck: No JVD, no thyromegaly, no carotid bruits Lungs: no use of accessory muscles, no dullness to percussion, clear without rales or rhonchi  Cardiovascular: Rhythm regular, heart sounds  normal, no murmurs or gallops, no peripheral edema Abdomen: soft and non-tender, no hepatosplenomegaly, BS normal. Musculoskeletal: No deformities, no cyanosis or clubbing , tenderness over left rib cage Neuro:  alert, non focal        Assessment &  Plan:

## 2020-10-09 NOTE — Patient Instructions (Signed)
CT chest W con in  3 months

## 2020-10-09 NOTE — Assessment & Plan Note (Signed)
Incidentally noted on CT scan which was done due to chest trauma.  Etiology is not clear at this time we discussed various possibilities including prior infection, inflammation and less likely malignancy.  She is very low clinical risk for lung cancer, no reason to suspect lymphoma.  Appearance is not typical for sarcoidosis.  We will take a conservative approach and repeat CT chest with contrast in 3 months to reassess.  Biopsy will only be considered if lymphadenopathy is enlarging or other pathology is evident

## 2020-10-12 MED FILL — DEXLANSOPRAZOLE 60 MG CPDR: 60 | 30 days supply | Qty: 30 | Fill #2

## 2020-10-15 ENCOUNTER — Other Ambulatory Visit (HOSPITAL_COMMUNITY): Payer: Self-pay | Admitting: Internal Medicine

## 2020-10-15 DIAGNOSIS — Z1231 Encounter for screening mammogram for malignant neoplasm of breast: Secondary | ICD-10-CM

## 2020-10-26 ENCOUNTER — Ambulatory Visit (HOSPITAL_COMMUNITY): Payer: 59

## 2020-10-30 ENCOUNTER — Institutional Professional Consult (permissible substitution): Payer: 59 | Admitting: Internal Medicine

## 2020-11-02 ENCOUNTER — Other Ambulatory Visit: Payer: 59

## 2020-11-04 ENCOUNTER — Institutional Professional Consult (permissible substitution): Payer: 59 | Admitting: Pulmonary Disease

## 2020-11-05 ENCOUNTER — Other Ambulatory Visit: Payer: Self-pay

## 2020-11-06 ENCOUNTER — Ambulatory Visit (HOSPITAL_COMMUNITY)
Admission: RE | Admit: 2020-11-06 | Discharge: 2020-11-06 | Disposition: A | Discharge status: Home / Self Care | Payer: 59 | Source: Ambulatory Visit | Attending: Internal Medicine | Admitting: Internal Medicine

## 2020-11-06 DIAGNOSIS — Z1231 Encounter for screening mammogram for malignant neoplasm of breast: Secondary | ICD-10-CM | POA: Insufficient documentation

## 2020-11-09 DIAGNOSIS — D751 Secondary polycythemia: Secondary | ICD-10-CM | POA: Diagnosis not present

## 2020-11-09 DIAGNOSIS — R590 Localized enlarged lymph nodes: Secondary | ICD-10-CM | POA: Diagnosis not present

## 2020-11-10 ENCOUNTER — Other Ambulatory Visit (HOSPITAL_BASED_OUTPATIENT_CLINIC_OR_DEPARTMENT_OTHER): Payer: Self-pay

## 2020-11-11 MED FILL — DEXLANSOPRAZOLE 60 MG CPDR: 60 | 30 days supply | Qty: 30 | Fill #3

## 2020-11-21 ENCOUNTER — Other Ambulatory Visit (HOSPITAL_COMMUNITY): Payer: Self-pay

## 2020-12-01 ENCOUNTER — Other Ambulatory Visit (HOSPITAL_COMMUNITY): Payer: Self-pay

## 2020-12-04 ENCOUNTER — Other Ambulatory Visit (HOSPITAL_COMMUNITY): Payer: Self-pay

## 2020-12-07 ENCOUNTER — Other Ambulatory Visit (HOSPITAL_COMMUNITY): Payer: Self-pay

## 2020-12-07 ENCOUNTER — Other Ambulatory Visit: Payer: Self-pay | Admitting: Adult Health

## 2020-12-07 MED ORDER — DEXLANSOPRAZOLE 60 MG PO CPDR
1.0000 | DELAYED_RELEASE_CAPSULE | Freq: Every day | ORAL | 1 refills | Status: DC
  Filled 2020-12-07: qty 90, 90d supply, fill #0
  Filled 2021-02-19: qty 90, 90d supply, fill #1

## 2020-12-09 ENCOUNTER — Other Ambulatory Visit (HOSPITAL_BASED_OUTPATIENT_CLINIC_OR_DEPARTMENT_OTHER): Payer: Self-pay

## 2020-12-10 ENCOUNTER — Other Ambulatory Visit (HOSPITAL_COMMUNITY): Payer: Self-pay

## 2020-12-10 MED FILL — Estradiol-Levonorgestrel TD Patch Weekly 0.045-0.015 MG/DAY: TRANSDERMAL | 28 days supply | Qty: 4 | Fill #0 | Status: AC

## 2020-12-12 ENCOUNTER — Other Ambulatory Visit (HOSPITAL_COMMUNITY): Payer: Self-pay

## 2020-12-14 ENCOUNTER — Other Ambulatory Visit (HOSPITAL_COMMUNITY): Payer: Self-pay

## 2020-12-24 ENCOUNTER — Other Ambulatory Visit (HOSPITAL_COMMUNITY)
Admission: RE | Admit: 2020-12-24 | Discharge: 2020-12-24 | Disposition: A | Discharge status: Home / Self Care | Payer: 59 | Source: Ambulatory Visit | Attending: Internal Medicine | Admitting: Internal Medicine

## 2020-12-24 ENCOUNTER — Other Ambulatory Visit: Payer: Self-pay

## 2020-12-24 ENCOUNTER — Other Ambulatory Visit (HOSPITAL_COMMUNITY): Payer: Self-pay

## 2020-12-24 ENCOUNTER — Other Ambulatory Visit (HOSPITAL_COMMUNITY)
Admission: RE | Admit: 2020-12-24 | Discharge: 2020-12-24 | Disposition: A | Discharge status: Home / Self Care | Payer: 59 | Source: Ambulatory Visit | Attending: Pulmonary Disease | Admitting: Pulmonary Disease

## 2020-12-24 DIAGNOSIS — Z0001 Encounter for general adult medical examination with abnormal findings: Secondary | ICD-10-CM | POA: Diagnosis not present

## 2020-12-24 DIAGNOSIS — R590 Localized enlarged lymph nodes: Secondary | ICD-10-CM

## 2020-12-24 LAB — CBC WITH DIFFERENTIAL/PLATELET
Abs Immature Granulocytes: 0.03 10*3/uL (ref 0.00–0.07)
Basophils Absolute: 0.1 10*3/uL (ref 0.0–0.1)
Basophils Relative: 1 %
Eosinophils Absolute: 0.2 10*3/uL (ref 0.0–0.5)
Eosinophils Relative: 3 %
HCT: 50.5 % — ABNORMAL HIGH (ref 36.0–46.0)
Hemoglobin: 16.5 g/dL — ABNORMAL HIGH (ref 12.0–15.0)
Immature Granulocytes: 1 %
Lymphocytes Relative: 20 %
Lymphs Abs: 1 10*3/uL (ref 0.7–4.0)
MCH: 29.5 pg (ref 26.0–34.0)
MCHC: 32.7 g/dL (ref 30.0–36.0)
MCV: 90.2 fL (ref 80.0–100.0)
Monocytes Absolute: 0.6 10*3/uL (ref 0.1–1.0)
Monocytes Relative: 12 %
Neutro Abs: 3 10*3/uL (ref 1.7–7.7)
Neutrophils Relative %: 63 %
Platelets: 286 10*3/uL (ref 150–400)
RBC: 5.6 MIL/uL — ABNORMAL HIGH (ref 3.87–5.11)
RDW: 12.6 % (ref 11.5–15.5)
WBC: 4.7 10*3/uL (ref 4.0–10.5)
nRBC: 0 % (ref 0.0–0.2)

## 2020-12-24 LAB — COMPREHENSIVE METABOLIC PANEL
ALT: 23 U/L (ref 0–44)
AST: 28 U/L (ref 15–41)
Albumin: 3.9 g/dL (ref 3.5–5.0)
Alkaline Phosphatase: 133 U/L — ABNORMAL HIGH (ref 38–126)
Anion gap: 7 (ref 5–15)
BUN: 19 mg/dL (ref 8–23)
CO2: 27 mmol/L (ref 22–32)
Calcium: 10 mg/dL (ref 8.9–10.3)
Chloride: 104 mmol/L (ref 98–111)
Creatinine, Ser: 1.02 mg/dL — ABNORMAL HIGH (ref 0.44–1.00)
GFR, Estimated: 60 mL/min — ABNORMAL LOW (ref >60)
Glucose, Bld: 99 mg/dL (ref 70–99)
Potassium: 5.5 mmol/L — ABNORMAL HIGH (ref 3.5–5.1)
Sodium: 138 mmol/L (ref 135–145)
Total Bilirubin: 1 mg/dL (ref 0.3–1.2)
Total Protein: 7.2 g/dL (ref 6.5–8.1)

## 2020-12-24 LAB — LIPID PANEL
Cholesterol: 213 mg/dL — ABNORMAL HIGH (ref 0–200)
HDL: 62 mg/dL (ref >40)
LDL Cholesterol: 129 mg/dL — ABNORMAL HIGH (ref 0–99)
Total CHOL/HDL Ratio: 3.4 RATIO
Triglycerides: 111 mg/dL (ref <150)
VLDL: 22 mg/dL (ref 0–40)

## 2020-12-24 LAB — URINALYSIS, ROUTINE W REFLEX MICROSCOPIC
Bilirubin Urine: NEGATIVE
Glucose, UA: NEGATIVE mg/dL
Hgb urine dipstick: NEGATIVE
Ketones, ur: 5 mg/dL — AB
Leukocytes,Ua: NEGATIVE
Nitrite: NEGATIVE
Protein, ur: NEGATIVE mg/dL
Specific Gravity, Urine: 1.024 (ref 1.005–1.030)
pH: 7 (ref 5.0–8.0)

## 2020-12-24 LAB — BASIC METABOLIC PANEL
Anion gap: 7 (ref 5–15)
BUN: 19 mg/dL (ref 8–23)
CO2: 29 mmol/L (ref 22–32)
Calcium: 10.3 mg/dL (ref 8.9–10.3)
Chloride: 107 mmol/L (ref 98–111)
Creatinine, Ser: 1.01 mg/dL — ABNORMAL HIGH (ref 0.44–1.00)
GFR, Estimated: >60 mL/min (ref >60)
Glucose, Bld: 103 mg/dL — ABNORMAL HIGH (ref 70–99)
Potassium: 5.6 mmol/L — ABNORMAL HIGH (ref 3.5–5.1)
Sodium: 143 mmol/L (ref 135–145)

## 2020-12-24 MED FILL — Simvastatin Tab 20 MG: ORAL | 90 days supply | Qty: 90 | Fill #0 | Status: AC

## 2020-12-24 MED FILL — Sertraline HCl Tab 50 MG: ORAL | 90 days supply | Qty: 90 | Fill #0 | Status: AC

## 2020-12-28 ENCOUNTER — Ambulatory Visit (HOSPITAL_COMMUNITY)
Admission: RE | Admit: 2020-12-28 | Discharge: 2020-12-28 | Disposition: A | Discharge status: Home / Self Care | Payer: 59 | Source: Ambulatory Visit | Attending: Pulmonary Disease | Admitting: Pulmonary Disease

## 2020-12-28 ENCOUNTER — Encounter (HOSPITAL_COMMUNITY): Payer: Self-pay | Admitting: Radiology

## 2020-12-28 DIAGNOSIS — I2510 Atherosclerotic heart disease of native coronary artery without angina pectoris: Secondary | ICD-10-CM | POA: Diagnosis not present

## 2020-12-28 DIAGNOSIS — R911 Solitary pulmonary nodule: Secondary | ICD-10-CM | POA: Diagnosis not present

## 2020-12-28 DIAGNOSIS — I700 Atherosclerosis of aorta: Secondary | ICD-10-CM | POA: Diagnosis not present

## 2020-12-28 DIAGNOSIS — R590 Localized enlarged lymph nodes: Secondary | ICD-10-CM | POA: Insufficient documentation

## 2020-12-28 DIAGNOSIS — J984 Other disorders of lung: Secondary | ICD-10-CM | POA: Diagnosis not present

## 2020-12-28 MED ORDER — IOHEXOL 300 MG/ML  SOLN
75.0000 mL | Freq: Once | INTRAMUSCULAR | Status: AC | PRN
  Administered 2020-12-28: 75 mL via INTRAVENOUS

## 2020-12-29 ENCOUNTER — Telehealth: Payer: Self-pay | Admitting: Pulmonary Disease

## 2020-12-29 NOTE — Telephone Encounter (Signed)
error 

## 2020-12-30 ENCOUNTER — Ambulatory Visit (HOSPITAL_COMMUNITY): Admission: RE | Admit: 2020-12-30 | Payer: 59 | Source: Ambulatory Visit

## 2020-12-30 ENCOUNTER — Other Ambulatory Visit (HOSPITAL_COMMUNITY): Payer: Self-pay

## 2021-01-14 ENCOUNTER — Encounter: Payer: Self-pay | Admitting: Pulmonary Disease

## 2021-01-14 ENCOUNTER — Other Ambulatory Visit: Payer: Self-pay

## 2021-01-14 ENCOUNTER — Ambulatory Visit: Payer: 59 | Admitting: Pulmonary Disease

## 2021-01-14 DIAGNOSIS — R590 Localized enlarged lymph nodes: Secondary | ICD-10-CM | POA: Diagnosis not present

## 2021-01-14 NOTE — Progress Notes (Signed)
   Subjective:    Patient ID: Courtney Bradley, female    DOB: 05-19-1951, 70 y.o.   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 in 2019 this was as low as 14.7  She feels like she has recovered from the fall, chest pain is resolved.  She is still working full-time.  No shortness of breath, no fevers or weight loss. She is planning a trip to [place] in October  Reviewed blood work from 5/5, no leukocytosis, hemoglobin 16.5, normal platelets  Significant tests/ events reviewed  CT chest w con 12/2020 >> Stable thoracic lymphadenopathy  CT chest W con 09/2020 >> Displaced left fifth through tenth rib fractures  Pathologic mediastinal and hilar lymphadenopathy, of uncertain etiology. Subcarinal 16 mm, AP window 11 mm  Review of Systems neg for any significant sore throat, dysphagia, itching, sneezing, nasal congestion or excess/ purulent secretions, fever, chills, sweats, unintended wt loss, pleuritic or exertional cp, hempoptysis, orthopnea pnd or change in chronic leg swelling. Also denies presyncope, palpitations, heartburn, abdominal pain, nausea, vomiting, diarrhea or change in bowel or urinary habits, dysuria,hematuria, rash, arthralgias, visual complaints, headache, numbness weakness or ataxia.     Objective:   Physical Exam   Gen. Pleasant, well-nourished, in no distress ENT - no thrush, no pallor/icterus,no post nasal drip Neck: No JVD, no thyromegaly, no carotid bruits Lungs: no use of accessory muscles, no dullness to percussion, clear without rales or rhonchi  Cardiovascular: Rhythm regular, heart sounds  normal, no murmurs or gallops, no peripheral edema Musculoskeletal: No deformities, no cyanosis or clubbing         Assessment & Plan:

## 2021-01-14 NOTE — Assessment & Plan Note (Signed)
Mediastinal lymphadenopathy is unchanged which is reassuring, etiology remains unclear but favor benign etiology.  I think it would be best to proceed with a PET scan to further define metabolic him.  We will schedule this in 3 months which will also allow [place] to evaluate for interval change in size. Discussed differential diagnosis here which includes old granulomatous disease, sarcoidosis and very unlikely lymphoma

## 2021-01-14 NOTE — Addendum Note (Signed)
Addended by: Merrilee Seashore on: 01/14/2021 10:29 AM   Modules accepted: Orders

## 2021-01-14 NOTE — Patient Instructions (Signed)
PET scan in  3 months

## 2021-01-15 ENCOUNTER — Other Ambulatory Visit (HOSPITAL_COMMUNITY): Payer: Self-pay

## 2021-01-15 MED FILL — Estradiol-Levonorgestrel TD Patch Weekly 0.045-0.015 MG/DAY: TRANSDERMAL | 28 days supply | Qty: 4 | Fill #1 | Status: AC

## 2021-01-19 ENCOUNTER — Other Ambulatory Visit (HOSPITAL_COMMUNITY): Payer: Self-pay

## 2021-02-19 ENCOUNTER — Other Ambulatory Visit (HOSPITAL_COMMUNITY): Payer: Self-pay

## 2021-02-19 MED FILL — Estradiol-Levonorgestrel TD Patch Weekly 0.045-0.015 MG/DAY: TRANSDERMAL | 28 days supply | Qty: 4 | Fill #2 | Status: AC

## 2021-02-24 ENCOUNTER — Other Ambulatory Visit (HOSPITAL_COMMUNITY): Payer: Self-pay

## 2021-03-10 ENCOUNTER — Encounter (INDEPENDENT_AMBULATORY_CARE_PROVIDER_SITE_OTHER): Payer: Self-pay

## 2021-03-15 ENCOUNTER — Encounter: Payer: Self-pay | Admitting: Gastroenterology

## 2021-03-15 ENCOUNTER — Ambulatory Visit: Payer: 59 | Admitting: Gastroenterology

## 2021-03-15 ENCOUNTER — Other Ambulatory Visit: Payer: Self-pay

## 2021-03-15 VITALS — BP 133/94 | HR 87 | Temp 97.7°F | Ht 66.5 in | Wt 189.2 lb

## 2021-03-15 DIAGNOSIS — R198 Other specified symptoms and signs involving the digestive system and abdomen: Secondary | ICD-10-CM | POA: Diagnosis not present

## 2021-03-15 DIAGNOSIS — K219 Gastro-esophageal reflux disease without esophagitis: Secondary | ICD-10-CM

## 2021-03-15 DIAGNOSIS — R197 Diarrhea, unspecified: Secondary | ICD-10-CM | POA: Insufficient documentation

## 2021-03-15 NOTE — Progress Notes (Signed)
Primary Care Physician:  Asencion Noble, MD  Primary Gastroenterologist:  Elon Alas. Abbey Chatters, DO   Chief Complaint  Patient presents with   Diarrhea    Worse with chocolate, fried foods, and some dairy products     HPI:  Courtney Bradley is a 70 y.o. female here for new patient evaluation for diarrhea.   Historically patient has a history of constipation.  In the past has required use of MiraLAX on a regular basis.  About 3 months ago she decided to try cutting [employer], weight loss plan.  She states within a couple of days of starting the shakes and bars she started having diarrhea.  She continued the plan for 3 weeks but then discontinued due to persistent diarrhea.  She has now been off [place] for 2 months.  Continues to have daily diarrhea.  Having 2 loose stools daily.  Rarely has a solid stool.  Denies any nocturnal BMs.  No melena or rectal bleeding.  Likely has some abdominal discomfort prior to BM but then resolves afterwards.  Symptoms associated with urgency and incontinence.  Initially she tried some Imodium which seemed to help but she is not continued this.  She has noted certain foods seem to trigger her diarrhea, chocolate, dairy, fried chicken.  She used to be able to tolerate these foods prior to 3 months ago.  She had gastric sleeve in 2018, continues to have early satiety associated to her surgery.  She is not sure if diarrhea is worsened when she overeats.  Denies any new foods.  She has started a vitamin D supplement but no other new medications.  Denies any recent antibiotics, travel abroad, camping, ill contacts.  She does consume well water.  She is on Dexilant with fairly good control of her reflux.  No nausea or vomiting.    Colonoscopy March 2018 with Dr. Laural Golden: -Diverticulosis at the hepatic flexure. -Diverticulosis in the sigmoid colon. -The examination was otherwise normal. -External hemorrhoids. -No specimens collected.   Current Outpatient Medications   Medication Sig Dispense Refill   Acetaminophen (TYLENOL EX ST ARTHRITIS PAIN PO) Take 500 mg by mouth 4 (four) times daily as needed.     Calcium Carbonate (CALCIUM 600 PO) Take 1 tablet by mouth every evening.      dexlansoprazole (DEXILANT) 60 MG capsule TAKE 1 CAPSULE BY MOUTH ONCE DAILY 90 capsule 1   estradiol-levonorgestrel (CLIMARAPRO) 0.045-0.015 MG/DAY PLACE 1 PATCH ONTO THE SKIN ONCE A WEEK. (Patient taking differently: Place 1 patch onto the skin once a week.) 4 patch 12   ibuprofen (ADVIL) 200 MG tablet Take 400 mg by mouth every 6 (six) hours as needed.     sertraline (ZOLOFT) 50 MG tablet TAKE 1 TABLET BY MOUTH AT BEDTIME. 90 tablet 3   simvastatin (ZOCOR) 20 MG tablet TAKE 1 TABLET (20 MG TOTAL) BY MOUTH DAILY. 90 tablet 4   VITAMIN D, CHOLECALCIFEROL, PO Take by mouth daily.     No current facility-administered medications for this visit.    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   Elevated cholesterol 11/28/2016   GERD (gastroesophageal reflux disease)    History of kidney stones    Hormone replacement therapy (HRT) 09/19/2014   Obesity    Osteopenia    Pneumonia    PONV (postoperative nausea and vomiting)  Past Surgical History:  Procedure Laterality Date   CHOLECYSTECTOMY     COLONOSCOPY N/A 11/17/2016   Procedure: COLONOSCOPY;  Surgeon: Rogene Houston, MD;  [facility];  Service: Endoscopy;  Laterality: N/A;  [place] TOE Bilateral    2nd   EYE SURGERY     GALLBLADDER SURGERY     LAPAROSCOPIC GASTRIC SLEEVE RESECTION WITH HIATAL HERNIA REPAIR N/A 03/14/2017   Procedure: LAPAROSCOPIC GASTRIC SLEEVE RESECTION WITH HIATAL HERNIA REPAIR;  Surgeon: Greer Pickerel, MD;  Location[facility];  Service: General;  Laterality: N/A;   LITHOTRIPSY     neck lift     UPPER GI ENDOSCOPY  03/14/2017    Procedure: UPPER GI ENDOSCOPY;  Surgeon: Greer Pickerel, MD;  Location: [facility];  Service: General;;    Family History  Problem Relation Age of Onset   Heart disease Mother    Diabetes Mother    Coronary artery disease Mother    Cancer Mother        Breast and thyroid cancer   Alzheimer's disease Father    Cancer Father        prostate   Hypercholesterolemia Brother    Suicidality Maternal Grandfather    Heart disease Paternal Grandmother    Obesity Daughter    Diabetes Daughter    Hypertension Daughter    Irritable bowel syndrome Daughter    Cancer Maternal Aunt        breast   Cancer Maternal Aunt        breast   Cancer Paternal Aunt        breast   Diabetes Half-Sister    Colon cancer Neg Hx    Celiac disease Neg Hx    Crohn's disease Neg Hx     Social History   Socioeconomic History   Marital status: Married    Spouse name: Not on file   Number of children: 2   Years of education: college   Highest education level: Not on file  Occupational History   Occupation: Engineer, drilling:   Tobacco Use   Smoking status: Never   Smokeless tobacco: Never  Vaping Use   Vaping Use: Never used  Substance and Sexual Activity   Alcohol use: Yes    Alcohol/week: 0.0 standard drinks    Comment: very seldom   Drug use: No   Sexual activity: Not Currently    Birth control/protection: Post-menopausal  Other Topics Concern   Not on file  Social History Narrative   Not on file   Social Determinants of Health   Financial Resource Strain: Not on file  Food Insecurity: Not on file  Transportation Needs: Not on file  Physical Activity: Not on file  Stress: Not on file  Social Connections: Not on file  Intimate Partner Violence: Not on file    ROS:  General: Negative for anorexia, weight loss, fever, chills, fatigue, weakness. Eyes: Negative for vision changes.  ENT: Negative for hoarseness, difficulty swallowing , nasal congestion. CV: Negative for  chest pain, angina, palpitations, dyspnea on exertion, peripheral edema.  Respiratory: Negative for dyspnea at rest, dyspnea on exertion, cough, sputum, wheezing.  GI: See history of present illness. GU:  Negative for dysuria, hematuria, urinary incontinence, urinary frequency, nocturnal urination.  MS: Negative for joint pain, low back pain.  Derm: Negative for rash or itching.  Neuro: Negative for weakness, abnormal sensation, seizure, frequent headaches, memory loss, confusion.  Psych: Negative for anxiety, depression,  suicidal ideation, hallucinations.  Endo: Negative for unusual weight change.  Heme: Negative for bruising or bleeding. Allergy: Negative for rash or hives.    Physical Examination:  BP (!) 133/94   Pulse 87   Temp 97.7 F (36.5 C) (Temporal)   Ht 5' 6.5" (1.689 m)   Wt 189 lb 3.2 oz (85.8 kg)   BMI 30.08 kg/m    General: Well-nourished, well-developed in no acute distress.  Head: Normocephalic, atraumatic.   Eyes: Conjunctiva pink, no icterus. Mouth: Oropharyngeal mucosa moist and pink , no lesions erythema or exudate. Neck: Supple without thyromegaly, masses, or lymphadenopathy.  Lungs: Clear to auscultation bilaterally.  Heart: Regular rate and rhythm, no murmurs rubs or gallops.  Abdomen: Bowel sounds are normal, nontender, nondistended, no hepatosplenomegaly or masses, no abdominal bruits or    hernia , no rebound or guarding.   Rectal: not performed Extremities: No lower extremity edema. No clubbing or deformities.  Neuro: Alert and oriented x 4 , grossly normal neurologically.  Skin: Warm and dry, no rash or jaundice.   Psych: Alert and cooperative, normal mood and affect.  Labs: Lab Results  Component Value Date   CREATININE 1.02 (H) 12/24/2020   BUN 19 12/24/2020   NA 138 12/24/2020   K 5.5 (H) 12/24/2020   CL 104 12/24/2020   CO2 27 12/24/2020   Lab Results  Component Value Date   ALT 23 12/24/2020   AST 28 12/24/2020   ALKPHOS 133 (H)  12/24/2020   BILITOT 1.0 12/24/2020   Lab Results  Component Value Date   WBC 4.7 12/24/2020   HGB 16.5 (H) 12/24/2020   HCT 50.5 (H) 12/24/2020   MCV 90.2 12/24/2020   PLT 286 12/24/2020       Imaging Studies: No results found.   Assessment:  Very pleasant 70 year old female with history of gastric sleeve (2018), GERD presenting for further evaluation of change in bowel habits, new onset diarrhea.  Historically and has chronic constipation, typically managed with daily MiraLAX.  Started weight loss program consisting of shakes and bars from [employer] and noted diarrhea within 1-2 days.  She took the supplements for approximately 3 weeks but due to persisting diarrhea she had to stop.  Now off of them for 2 months but persisting daily diarrhea associated with urgency/incontinence.  No nocturnal symptoms.  Sometimes aggravated by certain foods such as dairy, chocolate, fried chicken.  She has not paid attention if her symptoms seem to be related to when she gets too full.  No ill contacts, travel abroad, camping.  No recent antibiotics.  No new changes in medications except for addition of vitamin D.  Differential diagnosis for changing bowel habits/diarrhea can be broad.  Would recommend ruling out infectious etiology.  We will screen for thyroid dysfunction, celiac disease.  Encouraged her to continue probiotic.  Based on findings, she may ultimately need colonoscopy to further evaluate change in bowel habits.    Plan: Consider adding daily fiber supplement, 3 to 4 g daily, Benefiber or Fiber Choice. Continue probiotic. Labs for TSH/free T4, TTG IgA, IgA. Stool for GI profile. Consider avoiding food triggers.

## 2021-03-15 NOTE — Patient Instructions (Signed)
Complete labs and stool test. We will contact you with results when available. Continue probiotic for now. Consider adding fiber supplement such as Benefiber or FiberChoice. One serving daily (3-4 grams of fiber from supplement).

## 2021-03-15 NOTE — H&P (View-Only) (Signed)
Primary Care Physician:  Asencion Noble, MD  Primary Gastroenterologist:  Elon Alas. Abbey Chatters, DO   Chief Complaint  Patient presents with   Diarrhea    Worse with chocolate, fried foods, and some dairy products     HPI:  Courtney Bradley is a 70 y.o. female here for new patient evaluation for diarrhea.   Historically patient has a history of constipation.  In the past has required use of MiraLAX on a regular basis.  About 3 months ago she decided to try cutting [employer], weight loss plan.  She states within a couple of days of starting the shakes and bars she started having diarrhea.  She continued the plan for 3 weeks but then discontinued due to persistent diarrhea.  She has now been off [place] for 2 months.  Continues to have daily diarrhea.  Having 2 loose stools daily.  Rarely has a solid stool.  Denies any nocturnal BMs.  No melena or rectal bleeding.  Likely has some abdominal discomfort prior to BM but then resolves afterwards.  Symptoms associated with urgency and incontinence.  Initially she tried some Imodium which seemed to help but she is not continued this.  She has noted certain foods seem to trigger her diarrhea, chocolate, dairy, fried chicken.  She used to be able to tolerate these foods prior to 3 months ago.  She had gastric sleeve in 2018, continues to have early satiety associated to her surgery.  She is not sure if diarrhea is worsened when she overeats.  Denies any new foods.  She has started a vitamin D supplement but no other new medications.  Denies any recent antibiotics, travel abroad, camping, ill contacts.  She does consume well water.  She is on Dexilant with fairly good control of her reflux.  No nausea or vomiting.    Colonoscopy March 2018 with Dr. Laural Golden: -Diverticulosis at the hepatic flexure. -Diverticulosis in the sigmoid colon. -The examination was otherwise normal. -External hemorrhoids. -No specimens collected.   Current Outpatient Medications   Medication Sig Dispense Refill   Acetaminophen (TYLENOL EX ST ARTHRITIS PAIN PO) Take 500 mg by mouth 4 (four) times daily as needed.     Calcium Carbonate (CALCIUM 600 PO) Take 1 tablet by mouth every evening.      dexlansoprazole (DEXILANT) 60 MG capsule TAKE 1 CAPSULE BY MOUTH ONCE DAILY 90 capsule 1   estradiol-levonorgestrel (CLIMARAPRO) 0.045-0.015 MG/DAY PLACE 1 PATCH ONTO THE SKIN ONCE A WEEK. (Patient taking differently: Place 1 patch onto the skin once a week.) 4 patch 12   ibuprofen (ADVIL) 200 MG tablet Take 400 mg by mouth every 6 (six) hours as needed.     sertraline (ZOLOFT) 50 MG tablet TAKE 1 TABLET BY MOUTH AT BEDTIME. 90 tablet 3   simvastatin (ZOCOR) 20 MG tablet TAKE 1 TABLET (20 MG TOTAL) BY MOUTH DAILY. 90 tablet 4   VITAMIN D, CHOLECALCIFEROL, PO Take by mouth daily.     No current facility-administered medications for this visit.    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   Elevated cholesterol 11/28/2016   GERD (gastroesophageal reflux disease)    History of kidney stones    Hormone replacement therapy (HRT) 09/19/2014   Obesity    Osteopenia    Pneumonia    PONV (postoperative nausea and vomiting)  Past Surgical History:  Procedure Laterality Date   CHOLECYSTECTOMY     COLONOSCOPY N/A 11/17/2016   Procedure: COLONOSCOPY;  Surgeon: Rogene Houston, MD;  Location: [facility];  Service: Endoscopy;  Laterality: N/A;  [place] TOE Bilateral    2nd   EYE SURGERY     GALLBLADDER SURGERY     LAPAROSCOPIC GASTRIC SLEEVE RESECTION WITH HIATAL HERNIA REPAIR N/A 03/14/2017   Procedure: LAPAROSCOPIC GASTRIC SLEEVE RESECTION WITH HIATAL HERNIA REPAIR;  Surgeon: Greer Pickerel, MD;  Location: [facility];  Service: General;  Laterality: N/A;   LITHOTRIPSY     neck lift     UPPER GI ENDOSCOPY  03/14/2017    Procedure: UPPER GI ENDOSCOPY;  Surgeon: Greer Pickerel, MD;  Location: [facility];  Service: General;;    Family History  Problem Relation Age of Onset   Heart disease Mother    Diabetes Mother    Coronary artery disease Mother    Cancer Mother        Breast and thyroid cancer   Alzheimer's disease Father    Cancer Father        prostate   Hypercholesterolemia Brother    Suicidality Maternal Grandfather    Heart disease Paternal Grandmother    Obesity Daughter    Diabetes Daughter    Hypertension Daughter    Irritable bowel syndrome Daughter    Cancer Maternal Aunt        breast   Cancer Maternal Aunt        breast   Cancer Paternal Aunt        breast   Diabetes Half-Sister    Colon cancer Neg Hx    Celiac disease Neg Hx    Crohn's disease Neg Hx     Social History   Socioeconomic History   Marital status: Married    Spouse name: Not on file   Number of children: 2   Years of education: college   Highest education level: Not on file  Occupational History   Occupation: Engineer, drilling: [employer]  Tobacco Use   Smoking status: Never   Smokeless tobacco: Never  Vaping Use   Vaping Use: Never used  Substance and Sexual Activity   Alcohol use: Yes    Alcohol/week: 0.0 standard drinks    Comment: very seldom   Drug use: No   Sexual activity: Not Currently    Birth control/protection: Post-menopausal  Other Topics Concern   Not on file  Social History Narrative   Not on file   Social Determinants of Health   Financial Resource Strain: Not on file  Food Insecurity: Not on file  Transportation Needs: Not on file  Physical Activity: Not on file  Stress: Not on file  Social Connections: Not on file  Intimate Partner Violence: Not on file    ROS:  General: Negative for anorexia, weight loss, fever, chills, fatigue, weakness. Eyes: Negative for vision changes.  ENT: Negative for hoarseness, difficulty swallowing , nasal congestion. CV: Negative for  chest pain, angina, palpitations, dyspnea on exertion, peripheral edema.  Respiratory: Negative for dyspnea at rest, dyspnea on exertion, cough, sputum, wheezing.  GI: See history of present illness. GU:  Negative for dysuria, hematuria, urinary incontinence, urinary frequency, nocturnal urination.  MS: Negative for joint pain, low back pain.  Derm: Negative for rash or itching.  Neuro: Negative for weakness, abnormal sensation, seizure, frequent headaches, memory loss, confusion.  Psych: Negative for anxiety, depression,  suicidal ideation, hallucinations.  Endo: Negative for unusual weight change.  Heme: Negative for bruising or bleeding. Allergy: Negative for rash or hives.    Physical Examination:  BP (!) 133/94   Pulse 87   Temp 97.7 F (36.5 C) (Temporal)   Ht 5' 6.5" (1.689 m)   Wt 189 lb 3.2 oz (85.8 kg)   BMI 30.08 kg/m    General: Well-nourished, well-developed in no acute distress.  Head: Normocephalic, atraumatic.   Eyes: Conjunctiva pink, no icterus. Mouth: Oropharyngeal mucosa moist and pink , no lesions erythema or exudate. Neck: Supple without thyromegaly, masses, or lymphadenopathy.  Lungs: Clear to auscultation bilaterally.  Heart: Regular rate and rhythm, no murmurs rubs or gallops.  Abdomen: Bowel sounds are normal, nontender, nondistended, no hepatosplenomegaly or masses, no abdominal bruits or    hernia , no rebound or guarding.   Rectal: not performed Extremities: No lower extremity edema. No clubbing or deformities.  Neuro: Alert and oriented x 4 , grossly normal neurologically.  Skin: Warm and dry, no rash or jaundice.   Psych: Alert and cooperative, normal mood and affect.  Labs: Lab Results  Component Value Date   CREATININE 1.02 (H) 12/24/2020   BUN 19 12/24/2020   NA 138 12/24/2020   K 5.5 (H) 12/24/2020   CL 104 12/24/2020   CO2 27 12/24/2020   Lab Results  Component Value Date   ALT 23 12/24/2020   AST 28 12/24/2020   ALKPHOS 133 (H)  12/24/2020   BILITOT 1.0 12/24/2020   Lab Results  Component Value Date   WBC 4.7 12/24/2020   HGB 16.5 (H) 12/24/2020   HCT 50.5 (H) 12/24/2020   MCV 90.2 12/24/2020   PLT 286 12/24/2020       Imaging Studies: No results found.   Assessment:  Very pleasant 70 year old female with history of gastric sleeve (2018), GERD presenting for further evaluation of change in bowel habits, new onset diarrhea.  Historically and has chronic constipation, typically managed with daily MiraLAX.  Started weight loss program consisting of shakes and bars from [place] and noted diarrhea within 1-2 days.  She took the supplements for approximately 3 weeks but due to persisting diarrhea she had to stop.  Now off of them for 2 months but persisting daily diarrhea associated with urgency/incontinence.  No nocturnal symptoms.  Sometimes aggravated by certain foods such as dairy, chocolate, fried chicken.  She has not paid attention if her symptoms seem to be related to when she gets too full.  No ill contacts, travel abroad, camping.  No recent antibiotics.  No new changes in medications except for addition of vitamin D.  Differential diagnosis for changing bowel habits/diarrhea can be broad.  Would recommend ruling out infectious etiology.  We will screen for thyroid dysfunction, celiac disease.  Encouraged her to continue probiotic.  Based on findings, she may ultimately need colonoscopy to further evaluate change in bowel habits.    Plan: Consider adding daily fiber supplement, 3 to 4 g daily, Benefiber or Fiber Choice. Continue probiotic. Labs for TSH/free T4, TTG IgA, IgA. Stool for GI profile. Consider avoiding food triggers.

## 2021-03-16 ENCOUNTER — Other Ambulatory Visit: Payer: Self-pay | Admitting: Gastroenterology

## 2021-03-16 DIAGNOSIS — R198 Other specified symptoms and signs involving the digestive system and abdomen: Secondary | ICD-10-CM | POA: Diagnosis not present

## 2021-03-16 DIAGNOSIS — K219 Gastro-esophageal reflux disease without esophagitis: Secondary | ICD-10-CM | POA: Diagnosis not present

## 2021-03-16 DIAGNOSIS — R197 Diarrhea, unspecified: Secondary | ICD-10-CM | POA: Diagnosis not present

## 2021-03-18 LAB — GI PROFILE, STOOL, PCR

## 2021-03-19 LAB — TSH+FREE T4
Free T4: 1.18 ng/dL (ref 0.82–1.77)
TSH: 1.11 u[IU]/mL (ref 0.450–4.500)

## 2021-03-19 LAB — TISSUE TRANSGLUTAMINASE, IGA: Transglutaminase IgA: <2 U/mL (ref 0–3)

## 2021-03-19 LAB — IGA: IgA/Immunoglobulin A, Serum: 335 mg/dL (ref 87–352)

## 2021-03-23 ENCOUNTER — Telehealth: Payer: 59 | Admitting: Gastroenterology

## 2021-03-23 NOTE — Telephone Encounter (Signed)
Please schedule patient for colonoscopy with Dr. Abbey Chatters for chronic diarrhea. She is agreeable. ASA II.

## 2021-03-24 ENCOUNTER — Other Ambulatory Visit (HOSPITAL_COMMUNITY): Payer: Self-pay

## 2021-03-24 ENCOUNTER — Other Ambulatory Visit: Payer: Self-pay | Admitting: Gastroenterology

## 2021-03-24 MED ORDER — CLENPIQ 10-3.5-12 MG-GM -GM/160ML PO SOLN
1.0000 | Freq: Once | ORAL | 0 refills | Status: AC
  Filled 2021-03-24: qty 320, 1d supply, fill #0

## 2021-03-24 MED ORDER — DICYCLOMINE HCL 10 MG PO CAPS
ORAL_CAPSULE | ORAL | 0 refills | Status: DC
  Filled 2021-03-24: qty 90, 22d supply, fill #0

## 2021-03-24 NOTE — Addendum Note (Signed)
Addended by: Cheron Every on: 03/24/2021 08:27 AM   Modules accepted: Orders

## 2021-03-24 NOTE — Telephone Encounter (Signed)
Patient called back. She changed date to 8/19 at 8:30am.

## 2021-03-24 NOTE — Telephone Encounter (Signed)
LMOVM to call back 

## 2021-03-24 NOTE — Telephone Encounter (Signed)
Patient returned call. She has been scheduled for 9/23 at 7:30am. Aware will mail prep instructions and send rx to pharmacy.

## 2021-03-25 ENCOUNTER — Other Ambulatory Visit (HOSPITAL_COMMUNITY): Payer: Self-pay

## 2021-03-25 MED FILL — Estradiol-Levonorgestrel TD Patch Weekly 0.045-0.015 MG/DAY: TRANSDERMAL | 28 days supply | Qty: 4 | Fill #3 | Status: AC

## 2021-03-25 NOTE — Telephone Encounter (Signed)
PA submitted via [employer].Case ID# [id number]

## 2021-03-26 ENCOUNTER — Other Ambulatory Visit (HOSPITAL_COMMUNITY): Payer: Self-pay | Admitting: Internal Medicine

## 2021-03-26 ENCOUNTER — Other Ambulatory Visit (HOSPITAL_COMMUNITY): Payer: Self-pay

## 2021-03-26 ENCOUNTER — Other Ambulatory Visit (HOSPITAL_COMMUNITY): Payer: Self-pay | Admitting: Adult Health

## 2021-03-31 ENCOUNTER — Other Ambulatory Visit (HOSPITAL_COMMUNITY)
Admission: RE | Admit: 2021-03-31 | Discharge: 2021-03-31 | Disposition: A | Discharge status: Home / Self Care | Payer: 59 | Source: Ambulatory Visit | Attending: Internal Medicine | Admitting: Internal Medicine

## 2021-03-31 ENCOUNTER — Other Ambulatory Visit: Payer: Self-pay

## 2021-03-31 DIAGNOSIS — K219 Gastro-esophageal reflux disease without esophagitis: Secondary | ICD-10-CM | POA: Diagnosis not present

## 2021-03-31 DIAGNOSIS — C8528 Mediastinal (thymic) large B-cell lymphoma, lymph nodes of multiple sites: Secondary | ICD-10-CM | POA: Insufficient documentation

## 2021-03-31 DIAGNOSIS — Z79899 Other long term (current) drug therapy: Secondary | ICD-10-CM | POA: Diagnosis not present

## 2021-03-31 LAB — URINALYSIS, COMPLETE (UACMP) WITH MICROSCOPIC
Bilirubin Urine: NEGATIVE
Glucose, UA: NEGATIVE mg/dL
Hgb urine dipstick: NEGATIVE
Ketones, ur: NEGATIVE mg/dL
Nitrite: NEGATIVE
Protein, ur: NEGATIVE mg/dL
Specific Gravity, Urine: 1.016 (ref 1.005–1.030)
pH: 6 (ref 5.0–8.0)

## 2021-03-31 LAB — CBC WITH DIFFERENTIAL/PLATELET
Abs Immature Granulocytes: 0.07 10*3/uL (ref 0.00–0.07)
Basophils Absolute: 0.1 10*3/uL (ref 0.0–0.1)
Basophils Relative: 1 %
Eosinophils Absolute: 0.2 10*3/uL (ref 0.0–0.5)
Eosinophils Relative: 3 %
HCT: 52.7 % — ABNORMAL HIGH (ref 36.0–46.0)
Hemoglobin: 17.6 g/dL — ABNORMAL HIGH (ref 12.0–15.0)
Immature Granulocytes: 1 %
Lymphocytes Relative: 22 %
Lymphs Abs: 1.3 10*3/uL (ref 0.7–4.0)
MCH: 29.4 pg (ref 26.0–34.0)
MCHC: 33.4 g/dL (ref 30.0–36.0)
MCV: 88 fL (ref 80.0–100.0)
Monocytes Absolute: 0.6 10*3/uL (ref 0.1–1.0)
Monocytes Relative: 11 %
Neutro Abs: 3.6 10*3/uL (ref 1.7–7.7)
Neutrophils Relative %: 62 %
Platelets: 283 10*3/uL (ref 150–400)
RBC: 5.99 MIL/uL — ABNORMAL HIGH (ref 3.87–5.11)
RDW: 12.2 % (ref 11.5–15.5)
WBC: 5.8 10*3/uL (ref 4.0–10.5)
nRBC: 0 % (ref 0.0–0.2)

## 2021-03-31 LAB — COMPREHENSIVE METABOLIC PANEL
ALT: 19 U/L (ref 0–44)
AST: 22 U/L (ref 15–41)
Albumin: 4.1 g/dL (ref 3.5–5.0)
Alkaline Phosphatase: 140 U/L — ABNORMAL HIGH (ref 38–126)
Anion gap: 7 (ref 5–15)
BUN: 16 mg/dL (ref 8–23)
CO2: 27 mmol/L (ref 22–32)
Calcium: 10.4 mg/dL — ABNORMAL HIGH (ref 8.9–10.3)
Chloride: 108 mmol/L (ref 98–111)
Creatinine, Ser: 0.95 mg/dL (ref 0.44–1.00)
GFR, Estimated: >60 mL/min (ref >60)
Glucose, Bld: 101 mg/dL — ABNORMAL HIGH (ref 70–99)
Potassium: 5.2 mmol/L — ABNORMAL HIGH (ref 3.5–5.1)
Sodium: 142 mmol/L (ref 135–145)
Total Bilirubin: 1 mg/dL (ref 0.3–1.2)
Total Protein: 7.6 g/dL (ref 6.5–8.1)

## 2021-03-31 LAB — LIPID PANEL
Cholesterol: 196 mg/dL (ref 0–200)
HDL: 56 mg/dL (ref >40)
LDL Cholesterol: 117 mg/dL — ABNORMAL HIGH (ref 0–99)
Total CHOL/HDL Ratio: 3.5 RATIO
Triglycerides: 113 mg/dL (ref <150)
VLDL: 23 mg/dL (ref 0–40)

## 2021-04-02 ENCOUNTER — Ambulatory Visit (HOSPITAL_COMMUNITY): Payer: 59

## 2021-04-02 ENCOUNTER — Other Ambulatory Visit (HOSPITAL_COMMUNITY): Payer: Self-pay

## 2021-04-02 MED FILL — Simvastatin Tab 20 MG: ORAL | 90 days supply | Qty: 90 | Fill #1 | Status: AC

## 2021-04-02 MED FILL — Sertraline HCl Tab 50 MG: ORAL | 90 days supply | Qty: 90 | Fill #1 | Status: AC

## 2021-04-07 ENCOUNTER — Other Ambulatory Visit (HOSPITAL_COMMUNITY): Payer: Self-pay

## 2021-04-07 ENCOUNTER — Ambulatory Visit: Payer: 59 | Admitting: Pulmonary Disease

## 2021-04-09 ENCOUNTER — Other Ambulatory Visit: Payer: Self-pay

## 2021-04-09 ENCOUNTER — Encounter (HOSPITAL_COMMUNITY): Payer: Self-pay

## 2021-04-09 ENCOUNTER — Ambulatory Visit (HOSPITAL_COMMUNITY): Payer: 59 | Admitting: Anesthesiology

## 2021-04-09 ENCOUNTER — Encounter (HOSPITAL_COMMUNITY)
Admission: RE | Disposition: A | Discharge status: Home / Self Care | Payer: Self-pay | Source: Ambulatory Visit | Attending: Internal Medicine

## 2021-04-09 ENCOUNTER — Ambulatory Visit (HOSPITAL_COMMUNITY)
Admission: RE | Admit: 2021-04-09 | Discharge: 2021-04-09 | Disposition: A | Discharge status: Home / Self Care | Payer: 59 | Source: Ambulatory Visit | Attending: Internal Medicine | Admitting: Internal Medicine

## 2021-04-09 DIAGNOSIS — K529 Noninfective gastroenteritis and colitis, unspecified: Secondary | ICD-10-CM | POA: Diagnosis not present

## 2021-04-09 DIAGNOSIS — K5909 Other constipation: Secondary | ICD-10-CM | POA: Diagnosis not present

## 2021-04-09 DIAGNOSIS — Z8042 Family history of malignant neoplasm of prostate: Secondary | ICD-10-CM | POA: Diagnosis not present

## 2021-04-09 DIAGNOSIS — D120 Benign neoplasm of cecum: Secondary | ICD-10-CM | POA: Diagnosis not present

## 2021-04-09 DIAGNOSIS — Z880 Allergy status to penicillin: Secondary | ICD-10-CM | POA: Insufficient documentation

## 2021-04-09 DIAGNOSIS — Z833 Family history of diabetes mellitus: Secondary | ICD-10-CM | POA: Insufficient documentation

## 2021-04-09 DIAGNOSIS — Z8379 Family history of other diseases of the digestive system: Secondary | ICD-10-CM | POA: Insufficient documentation

## 2021-04-09 DIAGNOSIS — K644 Residual hemorrhoidal skin tags: Secondary | ICD-10-CM | POA: Diagnosis not present

## 2021-04-09 DIAGNOSIS — Z79899 Other long term (current) drug therapy: Secondary | ICD-10-CM | POA: Insufficient documentation

## 2021-04-09 DIAGNOSIS — Z820 Family history of epilepsy and other diseases of the nervous system: Secondary | ICD-10-CM | POA: Insufficient documentation

## 2021-04-09 DIAGNOSIS — D124 Benign neoplasm of descending colon: Secondary | ICD-10-CM | POA: Diagnosis not present

## 2021-04-09 DIAGNOSIS — Z791 Long term (current) use of non-steroidal anti-inflammatories (NSAID): Secondary | ICD-10-CM | POA: Diagnosis not present

## 2021-04-09 DIAGNOSIS — Z9884 Bariatric surgery status: Secondary | ICD-10-CM | POA: Insufficient documentation

## 2021-04-09 DIAGNOSIS — Z803 Family history of malignant neoplasm of breast: Secondary | ICD-10-CM | POA: Diagnosis not present

## 2021-04-09 DIAGNOSIS — K635 Polyp of colon: Secondary | ICD-10-CM | POA: Diagnosis not present

## 2021-04-09 DIAGNOSIS — K5730 Diverticulosis of large intestine without perforation or abscess without bleeding: Secondary | ICD-10-CM | POA: Diagnosis not present

## 2021-04-09 DIAGNOSIS — Z808 Family history of malignant neoplasm of other organs or systems: Secondary | ICD-10-CM | POA: Insufficient documentation

## 2021-04-09 DIAGNOSIS — Z8249 Family history of ischemic heart disease and other diseases of the circulatory system: Secondary | ICD-10-CM | POA: Diagnosis not present

## 2021-04-09 DIAGNOSIS — K648 Other hemorrhoids: Secondary | ICD-10-CM | POA: Insufficient documentation

## 2021-04-09 DIAGNOSIS — J189 Pneumonia, unspecified organism: Secondary | ICD-10-CM | POA: Diagnosis not present

## 2021-04-09 DIAGNOSIS — Z8349 Family history of other endocrine, nutritional and metabolic diseases: Secondary | ICD-10-CM | POA: Insufficient documentation

## 2021-04-09 HISTORY — PX: BIOPSY: SHX5522

## 2021-04-09 HISTORY — PX: POLYPECTOMY: SHX149

## 2021-04-09 HISTORY — PX: COLONOSCOPY WITH PROPOFOL: SHX5780

## 2021-04-09 SURGERY — COLONOSCOPY WITH PROPOFOL
Anesthesia: General

## 2021-04-09 MED ORDER — STERILE WATER FOR IRRIGATION IR SOLN
Status: DC | PRN
  Administered 2021-04-09: 200 mL

## 2021-04-09 MED ORDER — PROPOFOL 10 MG/ML IV BOLUS
INTRAVENOUS | Status: DC | PRN
  Administered 2021-04-09: 100 mg via INTRAVENOUS
  Administered 2021-04-09: 125 ug/kg/min via INTRAVENOUS

## 2021-04-09 MED ORDER — LIDOCAINE HCL (CARDIAC) PF 100 MG/5ML IV SOSY
PREFILLED_SYRINGE | INTRAVENOUS | Status: DC | PRN
  Administered 2021-04-09: 100 mg via INTRAVENOUS

## 2021-04-09 MED ORDER — LACTATED RINGERS IV SOLN: INTRAVENOUS | Status: DC

## 2021-04-09 NOTE — Op Note (Signed)
[facility] Patient Name: Courtney Bradley Procedure Date: 04/09/2021 8:14 AM MRN: [id number] Date of Birth: [birth year] Attending MD: Elon Alas. Abbey Chatters DO CSN: [id number] Age: 70 Admit Type: Outpatient Procedure:                Colonoscopy Indications:              Chronic diarrhea Providers:                Elon Alas. Abbey Chatters, DO, Crystal Page, Randa Spike, Technician Referring MD:              Medicines:                See the Anesthesia note for documentation of the                            administered medications Complications:            No immediate complications. Estimated Blood Loss:     Estimated blood loss was minimal. Procedure:                Pre-Anesthesia Assessment:                           - The anesthesia plan was to use monitored                            anesthesia care (MAC).                           After obtaining informed consent, the colonoscope                            was passed under direct vision. Throughout the                            procedure, the patient's blood pressure, pulse, and                            oxygen saturations were monitored continuously. The                            [id number]-HQ190L ([id number]) scope was introduced through                            the anus and advanced to the the terminal ileum,                            with identification of the appendiceal orifice and                            IC valve. The colonoscopy was performed without                            difficulty. The patient tolerated the procedure  well. The quality of the bowel preparation was                            evaluated using the BBPS [facility] Bowel Preparation                            Scale) with scores of: Right Colon = 3, Transverse                            Colon = 3 and Left Colon = 3 (entire mucosa seen                            well with no residual staining, small fragments  of                            stool or opaque liquid). The total BBPS score                            equals 9. Scope In: 8:21:54 AM Scope Out: 8:40:57 AM Scope Withdrawal Time: 0 hours 10 minutes 37 seconds  Total Procedure Duration: 0 hours 19 minutes 3 seconds  Findings:      The perianal and digital rectal examinations were normal.      Non-bleeding internal hemorrhoids were found during endoscopy.      A few small-mouthed diverticula were found in the sigmoid colon.      A 5 mm polyp was found in the cecum. The polyp was sessile. The polyp       was removed with a cold snare. Resection and retrieval were complete.      The terminal ileum appeared normal.      Biopsies for histology were taken with a cold forceps from the ascending       colon, transverse colon and descending colon for evaluation of       microscopic colitis. Impression:               - Non-bleeding internal hemorrhoids.                           - Diverticulosis in the sigmoid colon.                           - One 5 mm polyp in the cecum, removed with a cold                            snare. Resected and retrieved.                           - The examined portion of the ileum was normal.                           - Biopsies were taken with a cold forceps from the                            ascending colon, transverse colon and descending  colon for evaluation of microscopic colitis. Moderate Sedation:      Per Anesthesia Care Recommendation:           - Patient has a contact number available for                            emergencies. The signs and symptoms of potential                            delayed complications were discussed with the                            patient. Return to normal activities tomorrow.                            Written discharge instructions were provided to the                            patient.                           - Resume previous diet.                            - Continue present medications.                           - Await pathology results.                           - Repeat colonoscopy in 5 years for surveillance.                           - Return to GI clinic in 3 months.                           - Continue on Dicyclomine. Procedure Code(s):        --- Professional ---                           [phone], Colonoscopy, flexible; with removal of                            tumor(s), polyp(s), or other lesion(s) by snare                            technique Diagnosis Code(s):        --- Professional ---                           K64.8, Other hemorrhoids                           K63.5, Polyp of colon                           K52.9, Noninfective gastroenteritis and colitis,  unspecified                           K57.30, Diverticulosis of large intestine without                            perforation or abscess without bleeding CPT copyright 2019 American Medical Association. All rights reserved. The codes documented in this report are preliminary and upon coder review may  be revised to meet current compliance requirements. Elon Alas. Abbey Chatters, DO [place] Abbey Chatters, DO 04/09/2021 8:44:19 AM This report has been signed electronically. Number of Addenda: 0

## 2021-04-09 NOTE — Transfer of Care (Signed)
Immediate Anesthesia Transfer of Care Note  Patient: Courtney Bradley  Procedure(s) Performed: COLONOSCOPY WITH PROPOFOL BIOPSY POLYPECTOMY INTESTINAL  Patient Location: Endoscopy Unit  Anesthesia Type:General  Level of Consciousness: drowsy, patient cooperative and responds to stimulation  Airway & Oxygen Therapy: Patient Spontanous Breathing  Post-op Assessment: Report given to RN, Post -op Vital signs reviewed and stable and Patient moving all extremities X 4  Post vital signs: Reviewed and stable  Last Vitals:  Vitals Value Taken Time  BP    Temp    Pulse    Resp    SpO2      Last Pain:  Vitals:   04/09/21 0819  TempSrc:   PainSc: 0-No pain      Patients Stated Pain Goal: 3 ([id number])  Complications: No notable events documented.

## 2021-04-09 NOTE — Anesthesia Preprocedure Evaluation (Addendum)
Anesthesia Evaluation  Patient identified by MRN, date of birth, ID band Patient awake    Reviewed: Allergy & Precautions, NPO status , Patient's Chart, lab work & pertinent test results  History of Anesthesia Complications (+) PONV and history of anesthetic complications  Airway Mallampati: II  TM Distance: >3 FB Neck ROM: Full    Dental  (+) Dental Advisory Given Crowns, bridge :   Pulmonary pneumonia,    Pulmonary exam normal breath sounds clear to auscultation       Cardiovascular Exercise Tolerance: Good Normal cardiovascular exam Rhythm:Regular Rate:Normal     Neuro/Psych negative neurological ROS  negative psych ROS   GI/Hepatic Neg liver ROS, GERD (gastric sleeve)  Medicated and Controlled,  Endo/Other  negative endocrine ROS  Renal/GU negative Renal ROS     Musculoskeletal  (+) Arthritis , Osteoarthritis,    Abdominal   Peds  Hematology negative hematology ROS (+)   Anesthesia Other Findings Mediastinal lymphadenopathy  Reproductive/Obstetrics                           Anesthesia Physical Anesthesia Plan  ASA: 2  Anesthesia Plan: General   Post-op Pain Management:    Induction: Intravenous  PONV Risk Score and Plan: Propofol infusion  Airway Management Planned: Nasal Cannula and Natural Airway  Additional Equipment:   Intra-op Plan:   Post-operative Plan:   Informed Consent: I have reviewed the patients History and Physical, chart, labs and discussed the procedure including the risks, benefits and alternatives for the proposed anesthesia with the patient or authorized representative who has indicated his/her understanding and acceptance.       Plan Discussed with: CRNA and Surgeon  Anesthesia Plan Comments:         Anesthesia Quick Evaluation

## 2021-04-09 NOTE — Anesthesia Postprocedure Evaluation (Signed)
Anesthesia Post Note  Patient: Courtney Bradley  Procedure(s) Performed: COLONOSCOPY WITH PROPOFOL BIOPSY POLYPECTOMY INTESTINAL  Patient location during evaluation: Endoscopy Anesthesia Type: General Level of consciousness: awake and alert and oriented Pain management: pain level controlled Vital Signs Assessment: post-procedure vital signs reviewed and stable Respiratory status: spontaneous breathing and respiratory function stable Cardiovascular status: blood pressure returned to baseline and stable Postop Assessment: no apparent nausea or vomiting Anesthetic complications: no   No notable events documented.   Last Vitals:  Vitals:   04/09/21 0725 04/09/21 0843  BP: (!) 141/97 117/68  Pulse: 84   Resp: 11 12  Temp: 36.8 C 36.6 C  SpO2: 96% 94%    Last Pain:  Vitals:   04/09/21 0843  TempSrc: Oral  PainSc: 0-No pain                 [name] C Shafin Pollio

## 2021-04-09 NOTE — Discharge Instructions (Addendum)
  Colonoscopy Discharge Instructions  Read the instructions outlined below and refer to this sheet in the next few weeks. These discharge instructions provide you with general information on caring for yourself after you leave the hospital. Your doctor may also give you specific instructions. While your treatment has been planned according to the most current medical practices available, unavoidable complications occasionally occur.   ACTIVITY You may resume your regular activity, but move at a slower pace for the next 24 hours.  Take frequent rest periods for the next 24 hours.  Walking will help get rid of the air and reduce the bloated feeling in your belly (abdomen).  No driving for 24 hours (because of the medicine (anesthesia) used during the test).   Do not sign any important legal documents or operate any machinery for 24 hours (because of the anesthesia used during the test).  NUTRITION Drink plenty of fluids.  You may resume your normal diet as instructed by your doctor.  Begin with a light meal and progress to your normal diet. Heavy or fried foods are harder to digest and may make you feel sick to your stomach (nauseated).  Avoid alcoholic beverages for 24 hours or as instructed.  MEDICATIONS You may resume your normal medications unless your doctor tells you otherwise.  WHAT YOU CAN EXPECT TODAY Some feelings of bloating in the abdomen.  Passage of more gas than usual.  Spotting of blood in your stool or on the toilet paper.  IF YOU HAD POLYPS REMOVED DURING THE COLONOSCOPY: No aspirin products for 7 days or as instructed.  No alcohol for 7 days or as instructed.  Eat a soft diet for the next 24 hours.  FINDING OUT THE RESULTS OF YOUR TEST Not all test results are available during your visit. If your test results are not back during the visit, make an appointment with your caregiver to find out the results. Do not assume everything is normal if you have not heard from your  caregiver or the medical facility. It is important for you to follow up on all of your test results.  SEEK IMMEDIATE MEDICAL ATTENTION IF: You have more than a spotting of blood in your stool.  Your belly is swollen (abdominal distention).  You are nauseated or vomiting.  You have a temperature over 101.  You have abdominal pain or discomfort that is severe or gets worse throughout the day.   Your colonoscopy revealed 1 polyp which I removed successfully.  I did not see any active inflammation indicative of underlying Crohn's disease or ulcerative colitis.  I did take biopsies throughout your colon to rule out a condition called microscopic colitis that can cause diarrhea.  Await pathology results, my office will contact you.  Recommend repeat colonoscopy in 5 years for surveillance purposes given polyp removed today.  Continue on dicyclomine.  Follow-up with GI in 3 months. OFFICE WILL CALL WITH FOLLOW-UP APPOINTMENT.  I hope you have a great rest of your week!  Elon Alas. Abbey Chatters, D.O. Gastroenterology and Hepatology [facility]

## 2021-04-09 NOTE — Interval H&P Note (Signed)
History and Physical Interval Note:  04/09/2021 8:00 AM  Courtney Bradley  has presented today for surgery, with the diagnosis of chronic diarrhea.  The various methods of treatment have been discussed with the patient and family. After consideration of risks, benefits and other options for treatment, the patient has consented to  Procedure(s) with comments: COLONOSCOPY WITH PROPOFOL (N/A) - 8:30am as a surgical intervention.  The patient's history has been reviewed, patient examined, no change in status, stable for surgery.  I have reviewed the patient's chart and labs.  Questions were answered to the patient's satisfaction.     Eloise Harman

## 2021-04-12 LAB — SURGICAL PATHOLOGY

## 2021-04-13 ENCOUNTER — Other Ambulatory Visit: Payer: 59 | Admitting: Adult Health

## 2021-04-13 DIAGNOSIS — Z0000 Encounter for general adult medical examination without abnormal findings: Secondary | ICD-10-CM | POA: Diagnosis not present

## 2021-04-13 DIAGNOSIS — D751 Secondary polycythemia: Secondary | ICD-10-CM | POA: Diagnosis not present

## 2021-04-13 DIAGNOSIS — R590 Localized enlarged lymph nodes: Secondary | ICD-10-CM | POA: Diagnosis not present

## 2021-04-13 DIAGNOSIS — K219 Gastro-esophageal reflux disease without esophagitis: Secondary | ICD-10-CM | POA: Diagnosis not present

## 2021-04-13 DIAGNOSIS — Z6828 Body mass index (BMI) 28.0-28.9, adult: Secondary | ICD-10-CM | POA: Diagnosis not present

## 2021-04-20 ENCOUNTER — Encounter (HOSPITAL_COMMUNITY): Payer: Self-pay | Admitting: Internal Medicine

## 2021-04-22 ENCOUNTER — Telehealth: Payer: Self-pay

## 2021-04-22 NOTE — Telephone Encounter (Signed)
Pt called office, she recently had procedure and labs. [employer] denied her [facility] claim. She called [employer] and was told office needed to do an appeal. Advised her I would let office manager know and she would be back in office 04/27/21. Pt works at [employer]. She can be reached 04/27/21 at [phone]. If she's not available she will return call.

## 2021-04-23 ENCOUNTER — Other Ambulatory Visit (HOSPITAL_COMMUNITY): Payer: Self-pay

## 2021-04-23 MED FILL — Estradiol-Levonorgestrel TD Patch Weekly 0.045-0.015 MG/DAY: TRANSDERMAL | 28 days supply | Qty: 4 | Fill #4 | Status: AC

## 2021-04-27 NOTE — Telephone Encounter (Signed)
Phoned and spoke with the pt and she is going to fax me the copy of the EOB tomorrow morning. She stated that it just needs another code because it was coded as experimental on the lab form.

## 2021-04-28 NOTE — Telephone Encounter (Signed)
Its me Courtney Bradley is my first name) sorry. I got the bill and a paper to fill out your insurance information that's all. Did you send 2 pages?  Herb Grays T to Me     3:47 PM I faxed over to Dena at Dr Abbey Chatters office the letter I received saying that the insurance denied it  I still have the letter

## 2021-04-28 NOTE — Telephone Encounter (Signed)
Courtney Bradley, I got with the office manager and we need to know did you get a denial letter from the insurance company explaining why the lab was not covered or do you have your EOB or did [facility] have your insurance information (because it looks like it wasn't filed).

## 2021-04-28 NOTE — Telephone Encounter (Signed)
Sent the pt a message on her MyChart.

## 2021-04-30 ENCOUNTER — Other Ambulatory Visit: Payer: 59 | Admitting: Adult Health

## 2021-05-05 NOTE — Telephone Encounter (Signed)
noted 

## 2021-05-05 NOTE — Telephone Encounter (Signed)
Courtney Bradley, Did you get my message from before. The office manager was needing the EOB which we never got. All I got was the bill and a paper to fill out what insurance you have. Can you please send [place] a copy of EOB

## 2021-05-06 NOTE — Telephone Encounter (Signed)
noted 

## 2021-05-06 NOTE — Telephone Encounter (Signed)
Handled in pt call note

## 2021-05-06 NOTE — Telephone Encounter (Signed)
I received the EOB for the pt this morning and it is not being covered due to the lab being experimental or investigational in nature and is not covered under her plan. I will have the paperwork at my desk.

## 2021-05-12 ENCOUNTER — Telehealth: Payer: Self-pay

## 2021-05-12 NOTE — Telephone Encounter (Signed)
See mychart message from today.

## 2021-05-12 NOTE — Telephone Encounter (Signed)
Oleh Genin, CMA   Conversation: Advertising account planner [employer]) May 12, 2021 Me to Reba, [place]     12:20 PM Marena Chancy,       I spoke with Earlie Server at [facility] and was advised that from our end we did what we could do regarding this bill. All we did was send the Dx code, we don't do CPT coding here. Your insurance denied it even after looking at the Dr's AVS from that date of service and they still denied it stating it was experimental or investigational in nature and it is not covered under your plan. I'm sorry there wasn't anything else I could do from this point. I will advise the office manager of this as well.   Thank you, Dena  This MyChart message has not been read.

## 2021-05-18 NOTE — Telephone Encounter (Signed)
Ok I will forward this to our Glass blower/designer. Thanks for letting me know Dena

## 2021-05-20 ENCOUNTER — Other Ambulatory Visit (HOSPITAL_COMMUNITY): Payer: Self-pay

## 2021-05-20 ENCOUNTER — Other Ambulatory Visit: Payer: 59 | Admitting: Adult Health

## 2021-05-20 ENCOUNTER — Other Ambulatory Visit: Payer: Self-pay | Admitting: Adult Health

## 2021-05-20 DIAGNOSIS — Z23 Encounter for immunization: Secondary | ICD-10-CM | POA: Diagnosis not present

## 2021-05-20 MED ORDER — DEXLANSOPRAZOLE 60 MG PO CPDR
1.0000 | DELAYED_RELEASE_CAPSULE | Freq: Every day | ORAL | 1 refills | Status: DC
  Filled 2021-05-20: qty 90, 90d supply, fill #0

## 2021-05-20 MED FILL — Estradiol-Levonorgestrel TD Patch Weekly 0.045-0.015 MG/DAY: TRANSDERMAL | 28 days supply | Qty: 4 | Fill #5 | Status: AC

## 2021-06-25 ENCOUNTER — Other Ambulatory Visit: Payer: 59 | Admitting: Adult Health

## 2021-06-29 ENCOUNTER — Encounter: Payer: Self-pay | Admitting: Internal Medicine

## 2021-07-05 ENCOUNTER — Other Ambulatory Visit (HOSPITAL_COMMUNITY): Payer: Self-pay

## 2021-07-16 ENCOUNTER — Other Ambulatory Visit (HOSPITAL_COMMUNITY): Payer: Self-pay

## 2021-07-29 ENCOUNTER — Ambulatory Visit (HOSPITAL_COMMUNITY)
Admission: RE | Admit: 2021-07-29 | Discharge: 2021-07-29 | Disposition: A | Discharge status: Home / Self Care | Payer: PPO | Source: Ambulatory Visit | Attending: Pulmonary Disease | Admitting: Pulmonary Disease

## 2021-07-29 ENCOUNTER — Other Ambulatory Visit: Payer: Self-pay

## 2021-07-29 DIAGNOSIS — K449 Diaphragmatic hernia without obstruction or gangrene: Secondary | ICD-10-CM | POA: Diagnosis not present

## 2021-07-29 DIAGNOSIS — I253 Aneurysm of heart: Secondary | ICD-10-CM | POA: Diagnosis not present

## 2021-07-29 DIAGNOSIS — I2510 Atherosclerotic heart disease of native coronary artery without angina pectoris: Secondary | ICD-10-CM | POA: Diagnosis not present

## 2021-07-29 DIAGNOSIS — R590 Localized enlarged lymph nodes: Secondary | ICD-10-CM | POA: Diagnosis not present

## 2021-07-29 DIAGNOSIS — K7689 Other specified diseases of liver: Secondary | ICD-10-CM | POA: Diagnosis not present

## 2021-07-29 MED ORDER — FLUDEOXYGLUCOSE F - 18 (FDG) INJECTION
9.2620 | Freq: Once | INTRAVENOUS | Status: AC | PRN
  Administered 2021-07-29: 9.262 via INTRAVENOUS

## 2021-07-30 ENCOUNTER — Telehealth: Payer: Self-pay | Admitting: Pulmonary Disease

## 2021-07-30 DIAGNOSIS — R591 Generalized enlarged lymph nodes: Secondary | ICD-10-CM

## 2021-07-30 NOTE — Telephone Encounter (Signed)
Noted  

## 2021-07-30 NOTE — Telephone Encounter (Signed)
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

## 2021-08-02 ENCOUNTER — Telehealth: Payer: Self-pay | Admitting: Pulmonary Disease

## 2021-08-02 DIAGNOSIS — Z1283 Encounter for screening for malignant neoplasm of skin: Secondary | ICD-10-CM | POA: Diagnosis not present

## 2021-08-02 DIAGNOSIS — D485 Neoplasm of uncertain behavior of skin: Secondary | ICD-10-CM | POA: Diagnosis not present

## 2021-08-02 DIAGNOSIS — D2272 Melanocytic nevi of left lower limb, including hip: Secondary | ICD-10-CM | POA: Diagnosis not present

## 2021-08-02 DIAGNOSIS — D225 Melanocytic nevi of trunk: Secondary | ICD-10-CM | POA: Diagnosis not present

## 2021-08-02 NOTE — Telephone Encounter (Signed)
LMTCB   Will make patient aware someone from hospital will contact her to schedule.

## 2021-08-02 NOTE — Telephone Encounter (Signed)
These are scheduled by the hospital they review it then call the patient to scheduled Courtney Bradley in in review as of this morning it was put in late afternoon on Friday.

## 2021-08-02 NOTE — Progress Notes (Signed)
Patient placed on schedule for cervical lymphnode biopsy on 08/05/2021. Shirlean Mylar, RN spoke with patient via phone and discussed preprocedure instructions. She verbalized understanding regarding NPO after 0600 prior to procedure as well as having a driver present for post procedure/recovery/discharge.

## 2021-08-02 NOTE — Telephone Encounter (Signed)
Noted.   Will route message to [facility] team to get scheduled. Ladies if you guys do not schedule this just let us know. Thanks :)

## 2021-08-03 NOTE — Telephone Encounter (Signed)
Pt is scheduled for biopsy for 12/15.

## 2021-08-04 ENCOUNTER — Other Ambulatory Visit: Payer: Self-pay | Admitting: Radiology

## 2021-08-05 ENCOUNTER — Other Ambulatory Visit (HOSPITAL_COMMUNITY): Payer: Self-pay

## 2021-08-05 ENCOUNTER — Ambulatory Visit
Admission: RE | Admit: 2021-08-05 | Discharge: 2021-08-05 | Disposition: A | Discharge status: Home / Self Care | Payer: PPO | Source: Ambulatory Visit | Attending: Pulmonary Disease | Admitting: Pulmonary Disease

## 2021-08-05 DIAGNOSIS — R591 Generalized enlarged lymph nodes: Secondary | ICD-10-CM | POA: Diagnosis not present

## 2021-08-05 DIAGNOSIS — R590 Localized enlarged lymph nodes: Secondary | ICD-10-CM | POA: Diagnosis not present

## 2021-08-05 MED ORDER — SODIUM CHLORIDE 0.9 % IV SOLN: INTRAVENOUS | Status: DC

## 2021-08-05 NOTE — Procedures (Signed)
Pre Procedure Dx: Cervical and mediastinal lymphadenopathy Post Procedural Dx: Same  Technically successful US guided biopsy of right supraclavicular lymph node.   EBL: None  No immediate complications.   Ronny Bacon, MD Pager #: [phone]

## 2021-08-09 ENCOUNTER — Telehealth: Payer: Self-pay | Admitting: Pulmonary Disease

## 2021-08-09 ENCOUNTER — Other Ambulatory Visit: Payer: PPO | Admitting: Adult Health

## 2021-08-09 ENCOUNTER — Encounter: Payer: Self-pay | Admitting: Radiology

## 2021-08-09 LAB — SURGICAL PATHOLOGY

## 2021-08-09 NOTE — Telephone Encounter (Addendum)
Called and spoke to patient. She was wanting to speak to Dr. Elsworth Soho about sarcoidosis diagnoses she saw after biopsy and also had questions regarding future plans and how sarcoidosis is treated. Would like to speak to Dr. Elsworth Soho and  said her number on file is an okay number for him to call her at [phone]  Advised pt that Dr. Elsworth Soho is off today and he will be in office tomorrow 08/10/21. Patient states she gets home at 3:30 and will be by her phone at that time but will be working in the morning and may or may not be able to answer the phone at that time.    Dr. Elsworth Soho please advise.

## 2021-08-10 ENCOUNTER — Encounter: Payer: Self-pay | Admitting: Interventional Radiology

## 2021-08-20 NOTE — Telephone Encounter (Signed)
ATC patient. No VM available. Will reattempt.

## 2021-08-25 ENCOUNTER — Other Ambulatory Visit: Payer: Self-pay | Admitting: Adult Health

## 2021-08-26 NOTE — Telephone Encounter (Signed)
Pt returning call.   Pt. Attempted to return call 2 days ago. Will route to Dr. Elsworth Soho

## 2021-09-27 ENCOUNTER — Other Ambulatory Visit: Payer: 59 | Admitting: Adult Health

## 2021-09-30 ENCOUNTER — Telehealth: Payer: Self-pay | Admitting: Internal Medicine

## 2021-09-30 NOTE — Telephone Encounter (Signed)
[phone] patient called and said that her labs Courtney Bradley had her do last June were labeled as experimental and she has received an 800$ bill.  The lab asked that we recode the labs,  is this possible.

## 2021-10-06 ENCOUNTER — Telehealth: Payer: Self-pay | Admitting: Internal Medicine

## 2021-10-06 NOTE — Telephone Encounter (Signed)
Pt states that she is still having trouble with diarrhea daily. Pt states as long as she takes imodium she is fine. Pt states that when she eats bland foods the symptoms aren't as bad. Pt is wanting to know if there is anything that can be done or does she have to continue to take imodium daily?

## 2021-10-06 NOTE — Telephone Encounter (Signed)
Please call patient, she states she is still having the same issues as before her procedure.  Diarreha

## 2021-10-06 NOTE — Telephone Encounter (Signed)
Patient called again and left message that she wanted to speak to [facility].  It is about the Lab bill.  If she calls today I will relay the message from previously about the coding not being done here and done by the lab.

## 2021-10-06 NOTE — Telephone Encounter (Signed)
Pt states that she ran out of the dicyclomine and just started taking the imodium that it seemed to help her better. Routing to the front to schedule an appt with Dr. Abbey Chatters.

## 2021-10-06 NOTE — Telephone Encounter (Signed)
Just to clarify, is she taking dicyclomine anymore or just the imodium?  It is ok to continue imodium if it is helping her diarrhea but I would like for her to come in to see Dr. Abbey Chatters for her chronic diarrhea as it is not clear why she has it. She has been screened for microscopic colitis, IBD, celiac disease, infection.   Please make appt with Dr. Abbey Chatters.

## 2021-10-06 NOTE — Telephone Encounter (Signed)
Thank's fine. Thanks

## 2021-10-06 NOTE — Telephone Encounter (Signed)
OV made with Dr Abbey Chatters for 4/5 at 4pm and appt card mailed

## 2021-10-07 ENCOUNTER — Encounter (INDEPENDENT_AMBULATORY_CARE_PROVIDER_SITE_OTHER): Payer: Self-pay

## 2021-10-08 ENCOUNTER — Encounter (HOSPITAL_COMMUNITY): Payer: Self-pay | Admitting: *Deleted

## 2021-10-12 ENCOUNTER — Other Ambulatory Visit: Payer: Self-pay | Admitting: Adult Health

## 2021-10-25 ENCOUNTER — Encounter: Payer: Self-pay | Admitting: Pulmonary Disease

## 2021-10-25 ENCOUNTER — Other Ambulatory Visit: Payer: Self-pay

## 2021-10-25 ENCOUNTER — Ambulatory Visit (INDEPENDENT_AMBULATORY_CARE_PROVIDER_SITE_OTHER): Payer: PPO | Admitting: Pulmonary Disease

## 2021-10-25 VITALS — BP 126/84 | HR 94 | Temp 98.2°F | Ht 66.5 in | Wt 183.0 lb

## 2021-10-25 DIAGNOSIS — R590 Localized enlarged lymph nodes: Secondary | ICD-10-CM | POA: Diagnosis not present

## 2021-10-25 DIAGNOSIS — D869 Sarcoidosis, unspecified: Secondary | ICD-10-CM

## 2021-10-25 NOTE — Assessment & Plan Note (Signed)
-   Presenting as incidental finding of mediastinal lymphadenopathy, PET scan clarifying diffuse lymphadenopathy, biopsy showing nonnecrotizing granulomas. ?We discussed natural course of sarcoidosis. ?We will repeat calcium levels and ACE. ?If calcium is high, I will have her stop vitamin D and calcium supplementation. ?I expect her to have a benign course, natural history of sarcoidosis was discussed. ?We will plan on repeat CT imaging in December 2023 ?

## 2021-10-25 NOTE — Patient Instructions (Addendum)
Eye check up ?X Check calcium level , if higher than 10.3 would stop vit D supplement ? ?X check ACE level ?

## 2021-10-25 NOTE — Progress Notes (Signed)
? ?  Subjective:  ? ? Patient ID: Courtney Bradley, female    DOB: August 26, 1950, 71 y.o.   MRN: [id number] ? ?HPI ? 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  ?PMH -renal stones, polycythemia, hemoglobin noted to be 15.7-16.8 in 01/2020 and 17 in 2/22. in 2019 this was as low as 14.7 ? ?Chief Complaint  ?Patient presents with  ? Follow-up  ?  Breathing is doing well and denies any new co's today.   ? ?PET scan showed hypermetabolic 7 and diffuse lymphadenopathy in the chest, abdomen and pelvis and supraclavicular region.  She underwent supraclavicular lymph node biopsy which showed nonnecrotizing granulomatous, stains for AFB and GMS was negative. ?She feels well otherwise, denies cough wheezing, redness of eyes, skin lesions ?She underwent cataract surgery and has had detailed evaluation. ? ?2/7 EKG independently reviewed showed no ST-T wave changes and normal QT interval. ?Calcium was slight high noted to be 10.4 ? ? ?Significant tests/ events reviewed ? ?PET 07/2021 Extensive lymphadenopathy involving the neck, chest and pelvis  ?Moderate-sized hiatal hernia  ? ? ?CT chest w con 12/2020 >> Stable thoracic lymphadenopathy ?  ?CT chest W con 09/2020 >> Displaced left fifth through tenth rib fractures  Pathologic mediastinal and hilar lymphadenopathy, of uncertain etiology. Subcarinal 16 mm, AP window 11 mm ? ?Review of Systems ?neg for any significant sore throat, dysphagia, itching, sneezing, nasal congestion or excess/ purulent secretions, fever, chills, sweats, unintended wt loss, pleuritic or exertional cp, hempoptysis, orthopnea pnd or change in chronic leg swelling. Also denies presyncope, palpitations, heartburn, abdominal pain, nausea, vomiting, diarrhea or change in bowel or urinary habits, dysuria,hematuria, rash, arthralgias, visual complaints, headache, numbness weakness or ataxia. ? ?   ?Objective:  ? Physical Exam ? ?Gen.  Pleasant, obese, in no distress ?ENT - no lesions, no post nasal drip ?Neck: No JVD, no thyromegaly, no carotid bruits ?Lungs: no use of accessory muscles, no dullness to percussion, decreased without rales or rhonchi  ?Cardiovascular: Rhythm regular, heart sounds  normal, no murmurs or gallops, no peripheral edema ?Musculoskeletal: No deformities, no cyanosis or clubbing , no tremors ? ? ? ?   ?Assessment & Plan:  ? ? ?

## 2021-10-26 ENCOUNTER — Telehealth: Payer: Self-pay | Admitting: Pulmonary Disease

## 2021-10-26 LAB — CALCIUM: Calcium: 10.8 mg/dL — ABNORMAL HIGH (ref 8.7–10.3)

## 2021-10-26 LAB — ANGIOTENSIN CONVERTING ENZYME: Angio Convert Enzyme: 71 U/L (ref 14–82)

## 2021-10-27 NOTE — Telephone Encounter (Signed)
Patient replied to [employer] with recs to check vitamin for vitamin D. Nothing further needed.  ?

## 2021-10-27 NOTE — Telephone Encounter (Signed)
Please advise 

## 2021-10-27 NOTE — Telephone Encounter (Signed)
ATC patient X2 and both times line was answered but patient was unable to hear me. Will send patient a mychart message to notify  ?

## 2021-10-28 ENCOUNTER — Other Ambulatory Visit: Payer: Self-pay

## 2021-10-28 ENCOUNTER — Other Ambulatory Visit (HOSPITAL_COMMUNITY): Payer: Self-pay | Admitting: Adult Health

## 2021-10-28 ENCOUNTER — Ambulatory Visit (HOSPITAL_COMMUNITY): Payer: PPO

## 2021-10-28 DIAGNOSIS — Z1231 Encounter for screening mammogram for malignant neoplasm of breast: Secondary | ICD-10-CM

## 2021-11-01 ENCOUNTER — Other Ambulatory Visit: Payer: PPO | Admitting: Adult Health

## 2021-11-09 ENCOUNTER — Other Ambulatory Visit: Payer: Self-pay | Admitting: Adult Health

## 2021-11-17 ENCOUNTER — Other Ambulatory Visit: Payer: Self-pay

## 2021-11-17 ENCOUNTER — Ambulatory Visit (HOSPITAL_COMMUNITY)
Admission: RE | Admit: 2021-11-17 | Discharge: 2021-11-17 | Disposition: A | Discharge status: Home / Self Care | Payer: PPO | Source: Ambulatory Visit | Attending: Adult Health | Admitting: Adult Health

## 2021-11-17 DIAGNOSIS — Z1231 Encounter for screening mammogram for malignant neoplasm of breast: Secondary | ICD-10-CM | POA: Insufficient documentation

## 2021-11-18 ENCOUNTER — Other Ambulatory Visit (HOSPITAL_COMMUNITY): Payer: Self-pay | Admitting: Adult Health

## 2021-11-18 DIAGNOSIS — R928 Other abnormal and inconclusive findings on diagnostic imaging of breast: Secondary | ICD-10-CM

## 2021-11-23 ENCOUNTER — Other Ambulatory Visit (HOSPITAL_COMMUNITY): Payer: PPO

## 2021-11-23 ENCOUNTER — Ambulatory Visit (HOSPITAL_COMMUNITY)
Admission: RE | Admit: 2021-11-23 | Discharge: 2021-11-23 | Disposition: A | Discharge status: Home / Self Care | Payer: PPO | Source: Ambulatory Visit | Attending: Adult Health | Admitting: Adult Health

## 2021-11-23 ENCOUNTER — Other Ambulatory Visit (HOSPITAL_COMMUNITY): Payer: Self-pay | Admitting: Adult Health

## 2021-11-23 ENCOUNTER — Encounter (HOSPITAL_COMMUNITY): Payer: Self-pay

## 2021-11-23 ENCOUNTER — Ambulatory Visit (HOSPITAL_COMMUNITY): Admission: RE | Admit: 2021-11-23 | Payer: PPO | Source: Ambulatory Visit

## 2021-11-23 DIAGNOSIS — R928 Other abnormal and inconclusive findings on diagnostic imaging of breast: Secondary | ICD-10-CM | POA: Insufficient documentation

## 2021-11-23 DIAGNOSIS — N6001 Solitary cyst of right breast: Secondary | ICD-10-CM | POA: Diagnosis not present

## 2021-11-23 DIAGNOSIS — R922 Inconclusive mammogram: Secondary | ICD-10-CM | POA: Diagnosis not present

## 2021-11-23 MED ORDER — LIDOCAINE HCL (PF) 1 % IJ SOLN
5.0000 mL | Freq: Once | INTRAMUSCULAR | Status: AC
  Administered 2021-11-23: 5 mL via INTRADERMAL

## 2021-11-23 MED ORDER — LIDOCAINE HCL (PF) 1 % IJ SOLN
INTRAMUSCULAR | Status: AC
  Filled 2021-11-23: qty 5

## 2021-11-23 NOTE — Progress Notes (Signed)
PT tolerated right breast aspiration procedure well today and ambulatory at discharge with no acute distress noted. PT verbalized understanding of discharge instructions.  ?

## 2021-11-24 ENCOUNTER — Ambulatory Visit: Payer: PPO | Admitting: Internal Medicine

## 2021-12-02 ENCOUNTER — Other Ambulatory Visit: Payer: Self-pay | Admitting: Adult Health

## 2021-12-10 ENCOUNTER — Other Ambulatory Visit: Payer: PPO | Admitting: Plastic Surgery

## 2022-01-03 ENCOUNTER — Other Ambulatory Visit: Payer: PPO | Admitting: Adult Health

## 2022-02-04 IMAGING — CT CT HEAD W/O CM
4 series · 16 of 47 positions shown, 18 images · non-contrast
Comparison: None.

CLINICAL DATA: Fall

EXAM:
CT HEAD WITHOUT CONTRAST
TECHNIQUE: Contiguous axial images were obtained from the base of the skull
through the vertex without intravenous contrast.

[Series 1: head w o · axial · 0.43mm/px · z∈[+136,+246]mm · 6 of 32 slices shown, 8 images]
[im 5/32  brain]
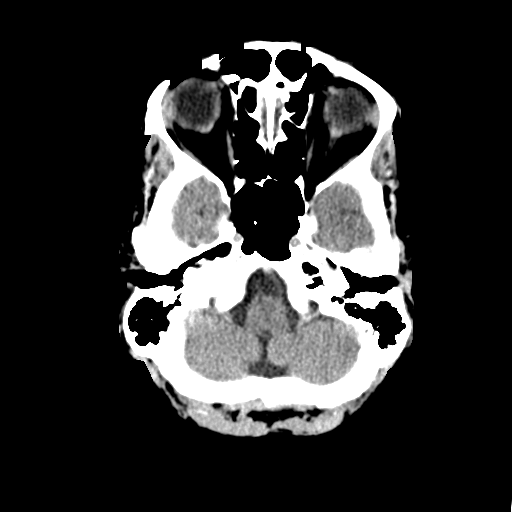
[im 5/32  bone]
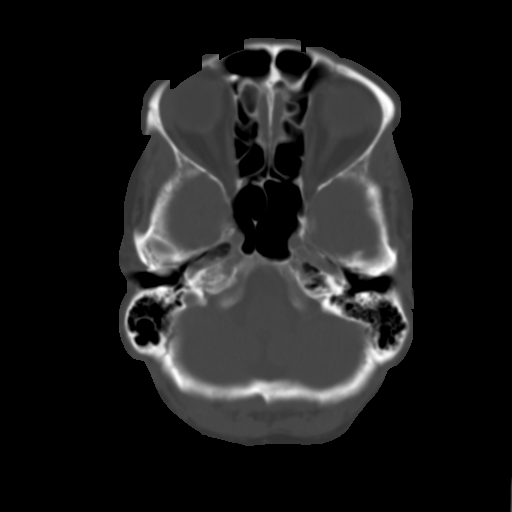
[im 9/32  brain]
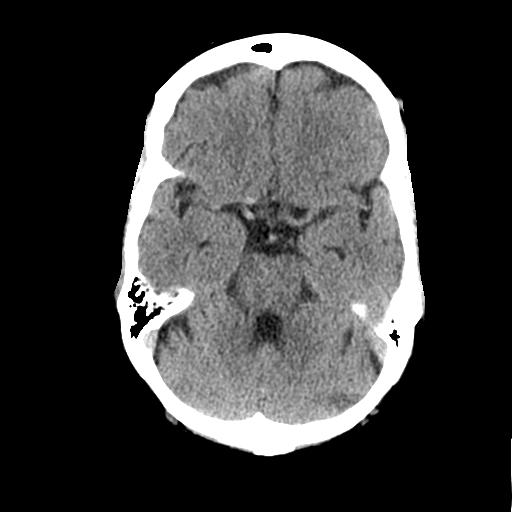
[im 14/32  brain]
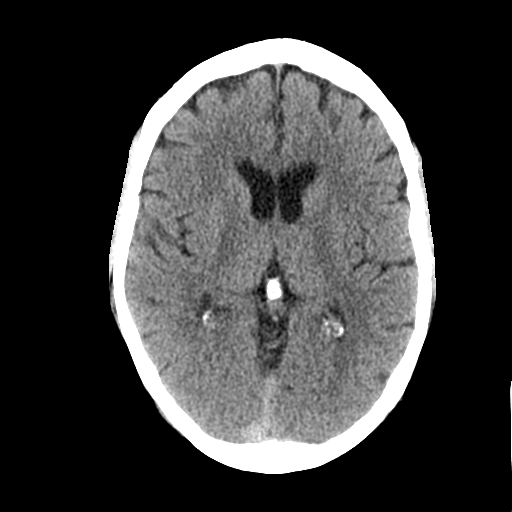
[im 18/32  brain]
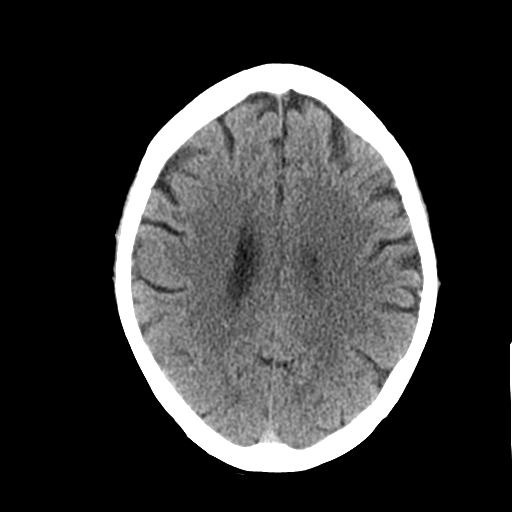
[im 23/32  brain]
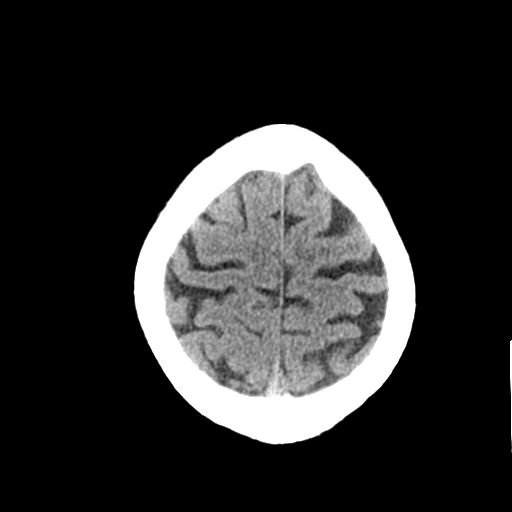
[im 23/32  bone]
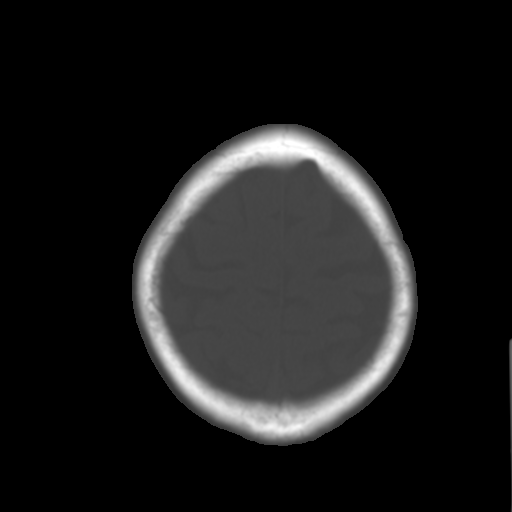
[im 27/32  brain]
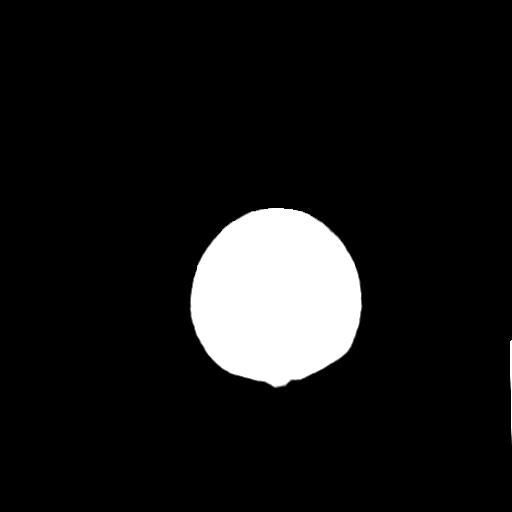

[Series 3: head bone · axial · 0.43mm/px · z∈[+125,+179]mm · 4 of 81 slices shown]
[im 8/81  bone]
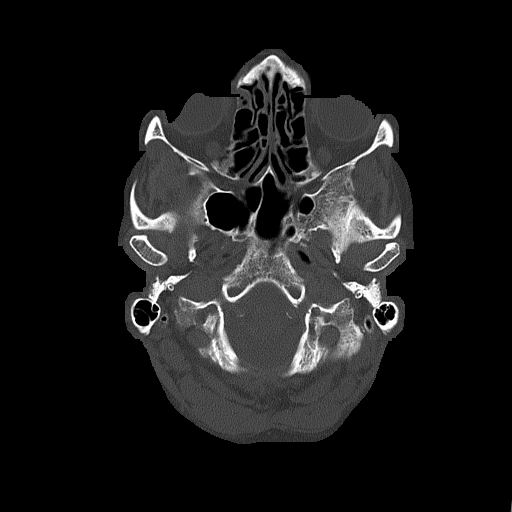
[im 16/81  bone]
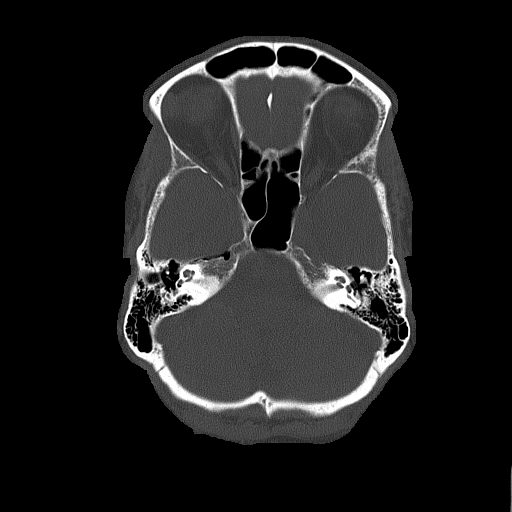
[im 27/81  bone]
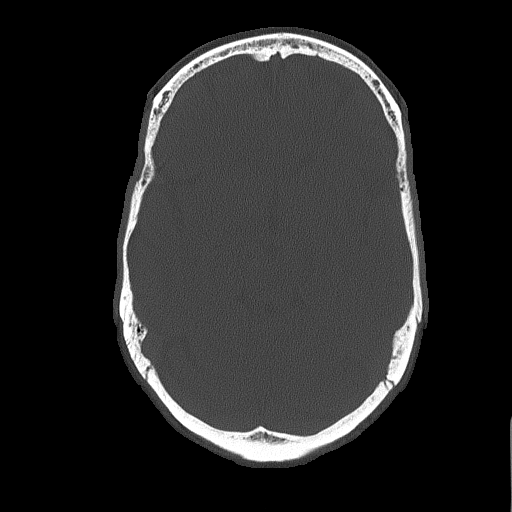
[im 35/81  bone]
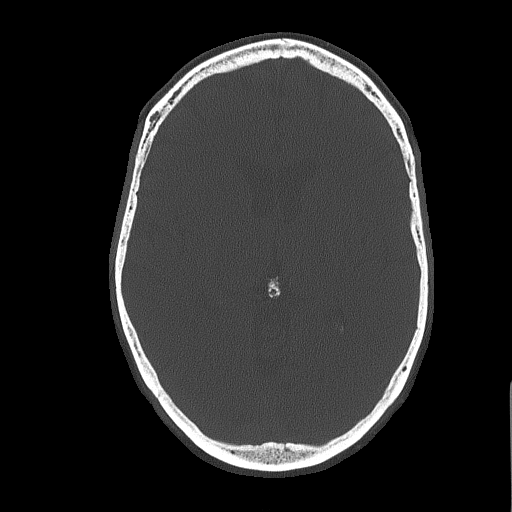

[Series 4: sagittal soft · sagittal · 0.31mm/px · 3 of 53 slices shown]
[im 18/53  brain]
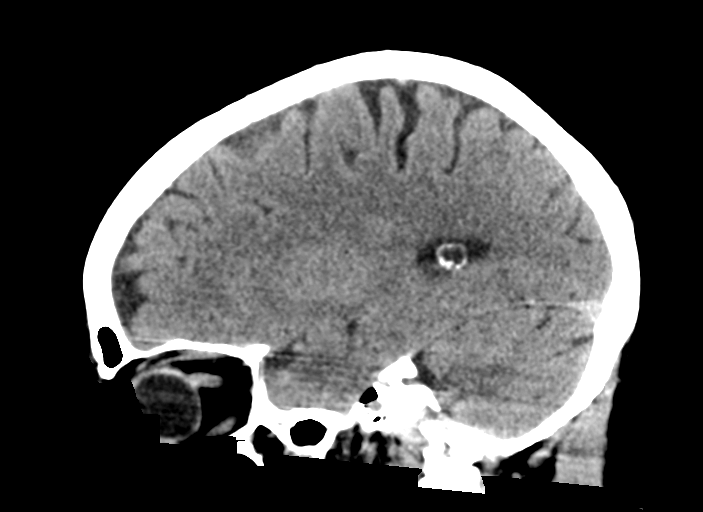
[im 27/53  brain]
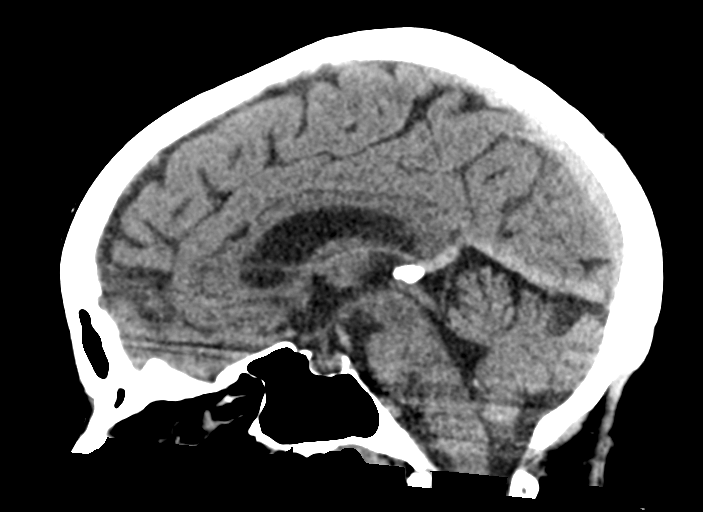
[im 35/53  brain]
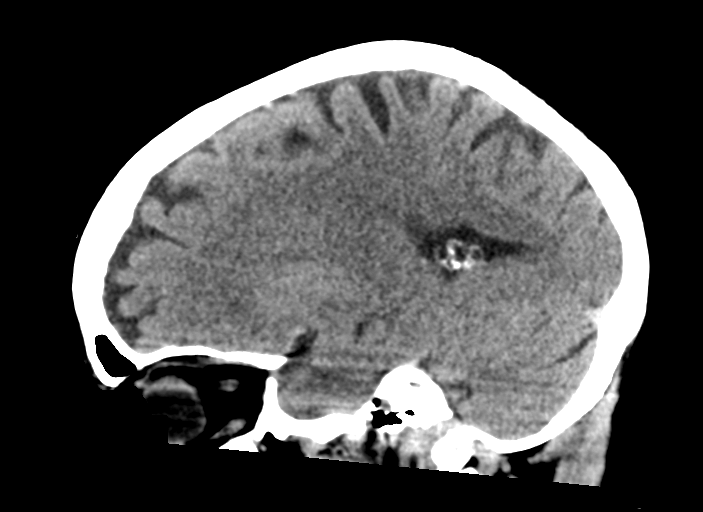

[Series 5: coronal soft · coronal · 0.31mm/px · 3 of 70 slices shown]
[im 24/70  brain]
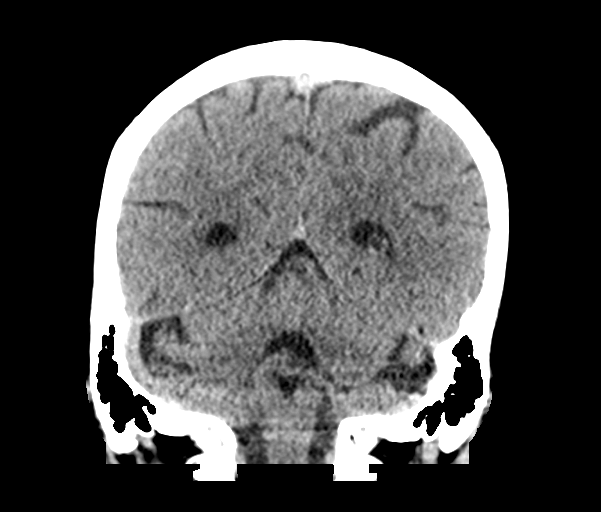
[im 31/70  brain]
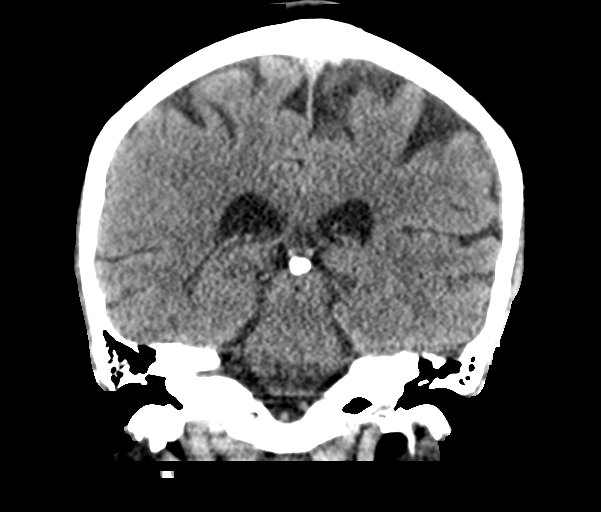
[im 39/70  brain]
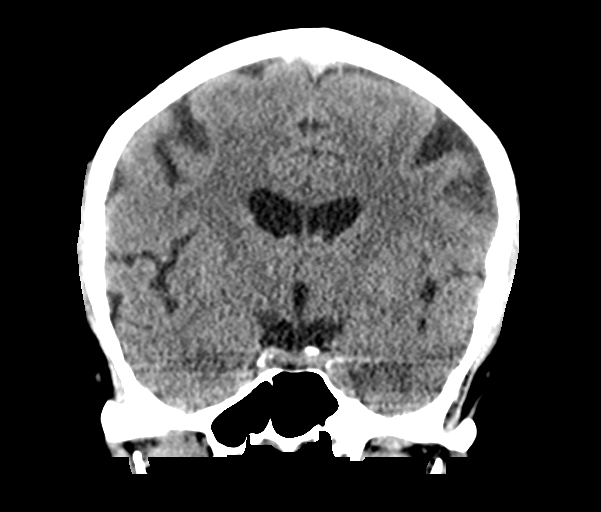

[16 of 47 positions shown; findings below may reference images not displayed]

FINDINGS: Brain: No evidence of acute infarction, hemorrhage, hydrocephalus,
extra-axial collection or mass lesion/mass effect.

Vascular: No hyperdense vessel or unexpected calcification.

Skull: Normal. Negative for fracture or focal lesion.

Sinuses/Orbits: Opacification of the visualized LEFT maxillary
sinus.

Other: LEFT forehead fat stranding.
IMPRESSION: 1. No acute intracranial pathology.
2. LEFT forehead fat stranding.
3. Opacification of the visualized LEFT maxillary sinus.

## 2022-02-04 IMAGING — CT CT CHEST W/ CM
2 of 3 series · 15 of 36 positions shown, 18 images · IV contrast (Omnipaque or Isovue)
Comparison: 09/27/2020

CLINICAL DATA: Fell this morning, left rib fractures

EXAM:
CT CHEST WITH CONTRAST
TECHNIQUE: Multidetector CT imaging of the chest was performed during
intravenous contrast administration.
CONTRAST:  75mL OMNIPAQUE IOHEXOL 300 MG/ML  SOLN

[Series 2: routine chest with · axial · 0.75mm/px · z∈[+1084,+1352]mm · 12 of 158 slices shown, 15 images]
[im 12/158  mediastinal]
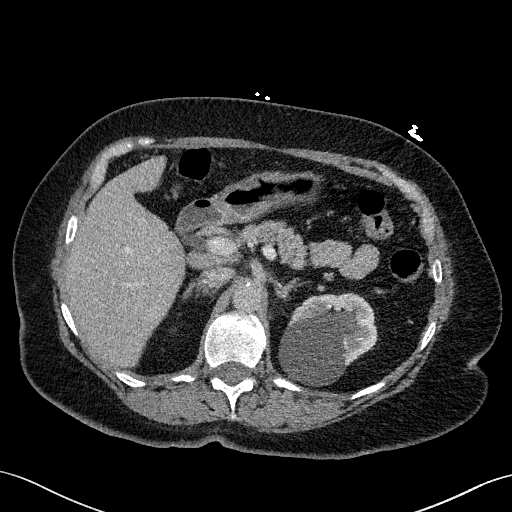
[im 12/158  lung]
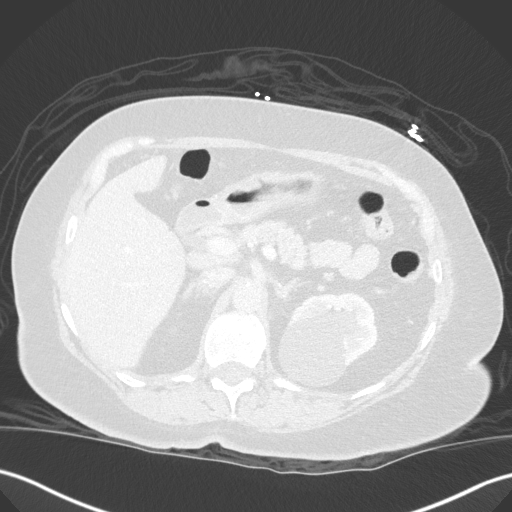
[im 24/158  lung]
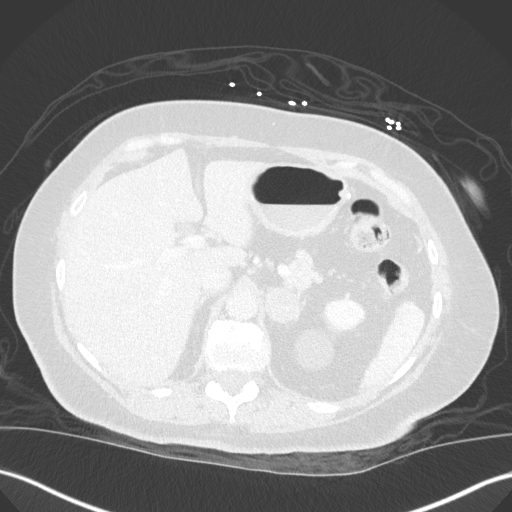
[im 35/158  lung]
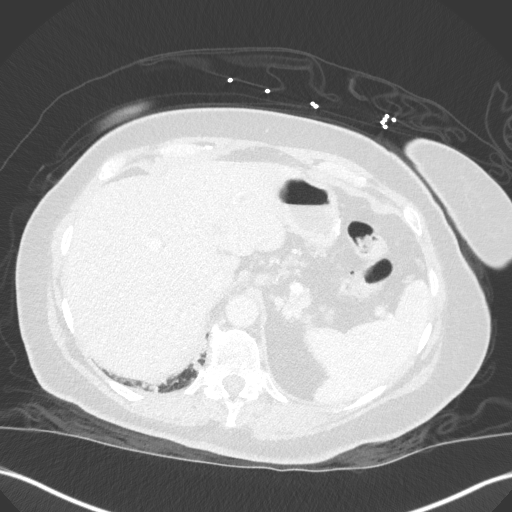
[im 47/158  lung]
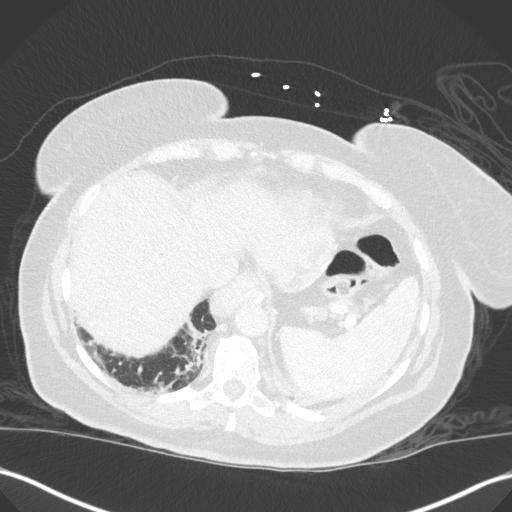
[im 59/158  mediastinal]
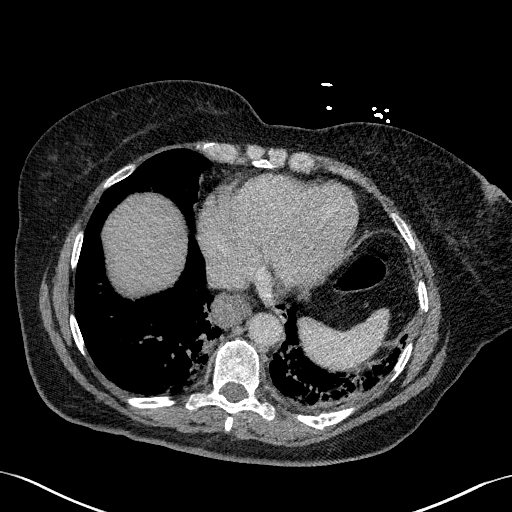
[im 59/158  lung]
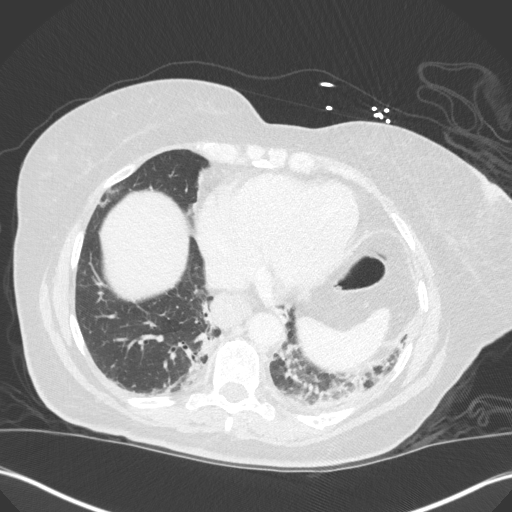
[im 70/158  lung]
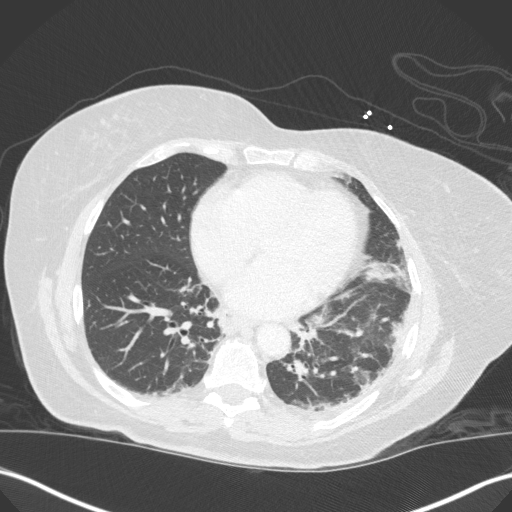
[im 88/158  lung]
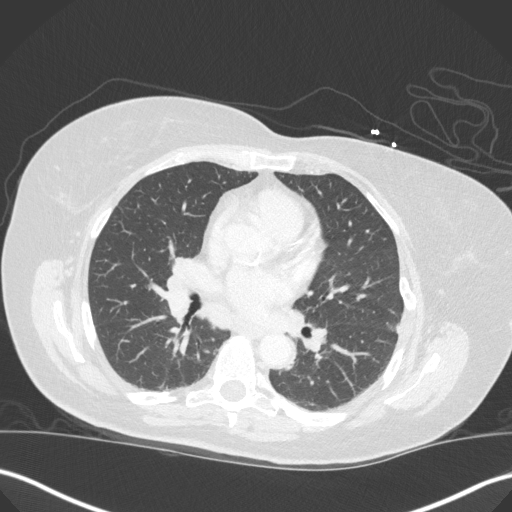
[im 99/158  lung]
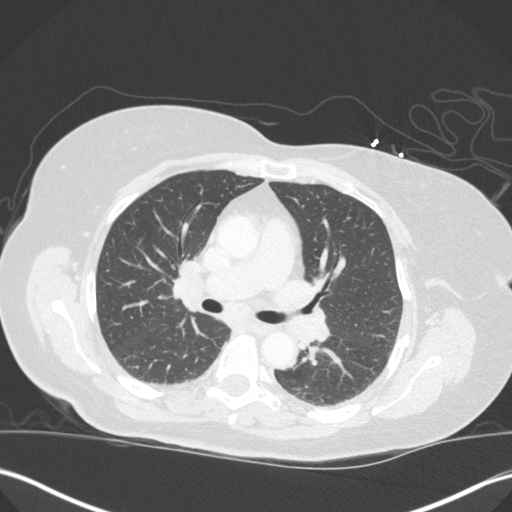
[im 111/158  mediastinal]
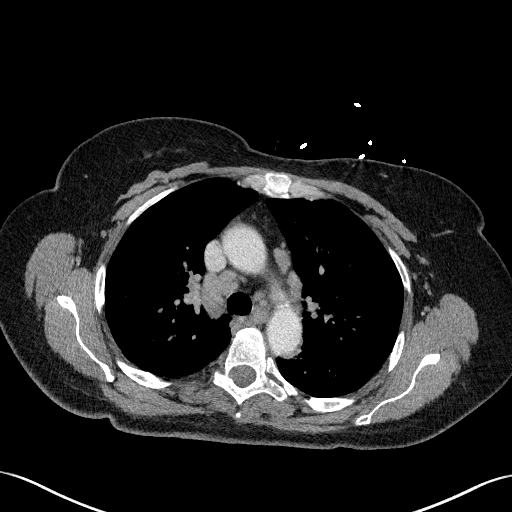
[im 111/158  lung]
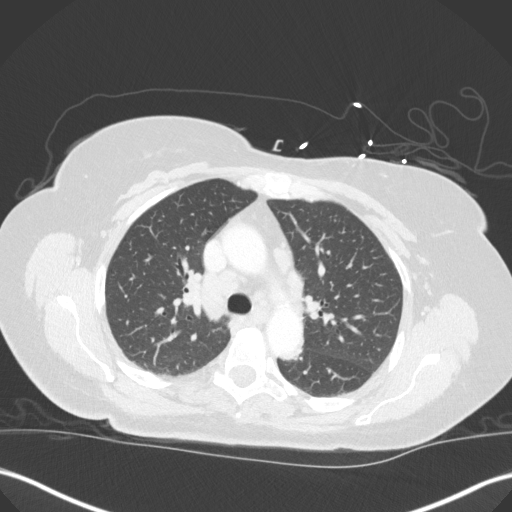
[im 123/158  lung]
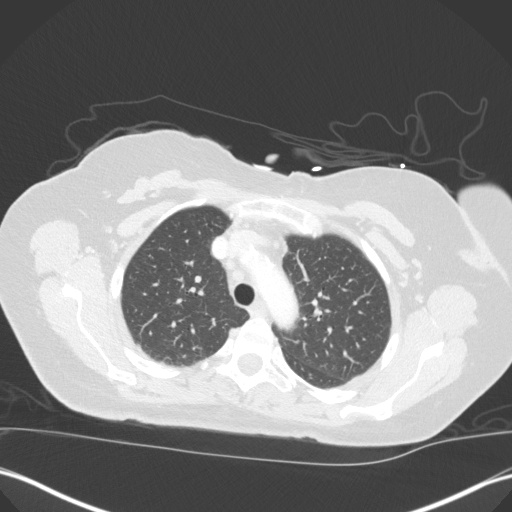
[im 134/158  lung]
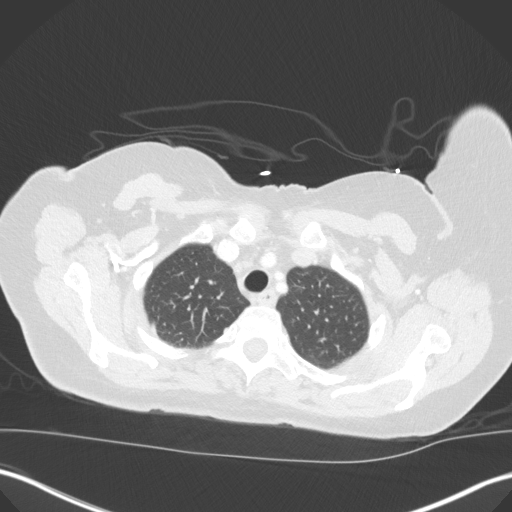
[im 146/158  lung]
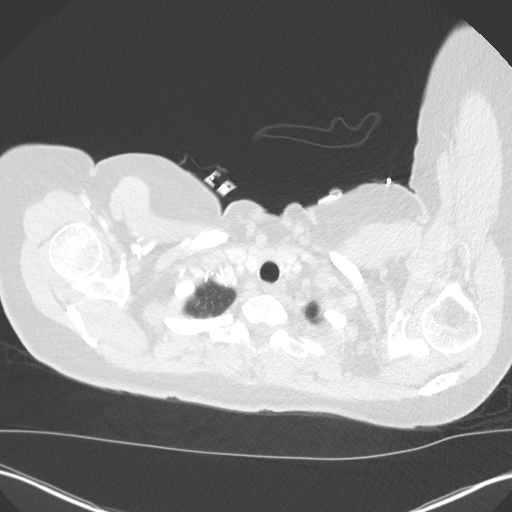

[Series 5: coronal · coronal · 0.67mm/px · 3 of 149 slices shown]
[im 30/149  lung]
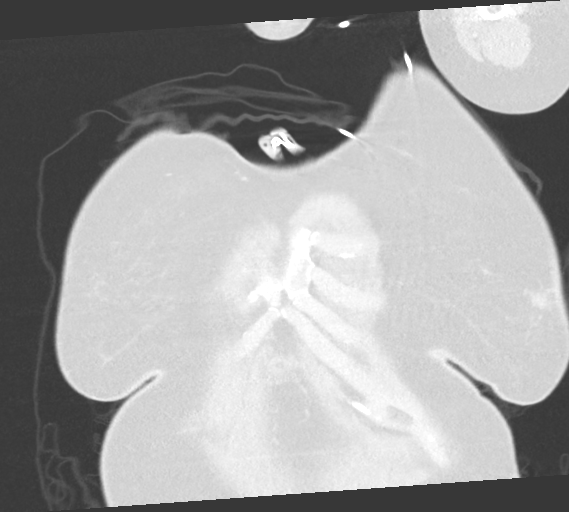
[im 60/149  lung]
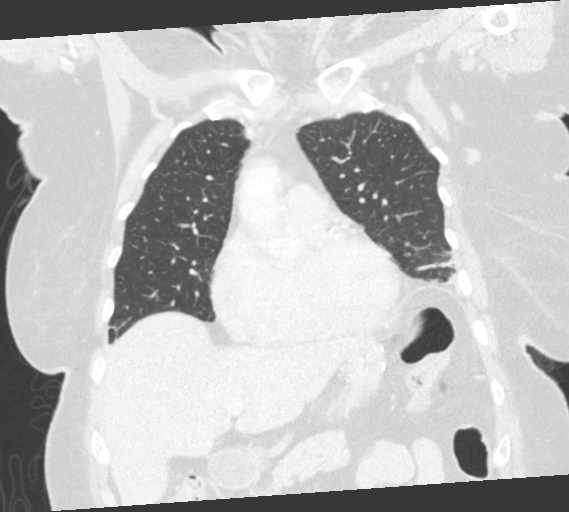
[im 89/149  lung]
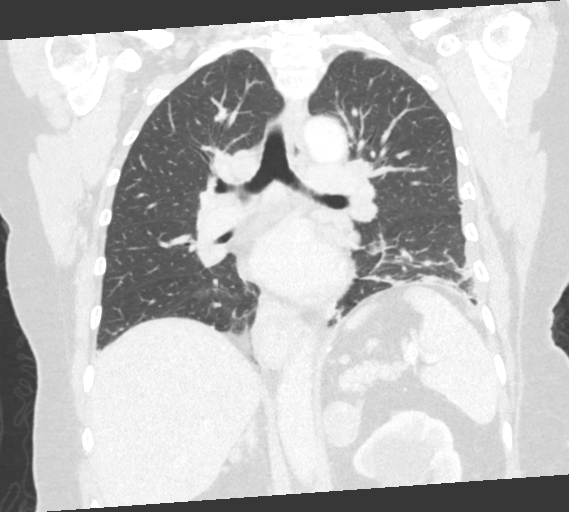

[15 of 36 positions shown; findings below may reference images not displayed]

FINDINGS: Cardiovascular: The heart is unremarkable without pericardial
effusion. No evidence of thoracic aortic aneurysm or dissection.
Mild atherosclerosis of the aorta and coronary vasculature.

Mediastinum/Nodes: There is mediastinal and bilateral hilar
lymphadenopathy, nonspecific. Largest lymph node in the AP window
measures 11 mm in short axis. Largest subcarinal lymph node measures
16 mm in short axis.

The thyroid, trachea, and esophagus are grossly unremarkable. There
is a small hiatal hernia.

Lungs/Pleura: Hypoventilatory changes are seen at the lung bases,
left greater than right. There may be some underlying subpleural
scarring and fibrosis as well. No acute airspace disease, effusion,
or pneumothorax. The central airways are patent.

Upper Abdomen: No acute upper abdominal findings. Evidence of prior
gastric surgery. 2.8 cm left adrenal mass consistent with adenoma as
detailed on previous imaging exams. Stable left renal cyst.

Musculoskeletal: There are acute displaced left lateral fifth,
sixth, and seventh rib fractures. In addition, there are displaced
left posterior eighth, ninth, and tenth rib fractures. Numerous
other prior healed left rib fractures are incidentally noted.
Reconstructed images demonstrate no additional findings.
IMPRESSION: 1. Displaced left fifth through tenth rib fractures as above.
2. Pathologic mediastinal and hilar lymphadenopathy, of uncertain
etiology. Appropriate clinical follow-up recommended.
3. Hypoventilatory changes at the lung bases. No effusion or
pneumothorax.

## 2022-02-08 ENCOUNTER — Other Ambulatory Visit: Payer: PPO | Admitting: Adult Health

## 2022-02-14 ENCOUNTER — Encounter: Payer: Self-pay | Admitting: Adult Health

## 2022-02-14 ENCOUNTER — Telehealth: Payer: Self-pay | Admitting: Pulmonary Disease

## 2022-02-14 ENCOUNTER — Other Ambulatory Visit (HOSPITAL_COMMUNITY)
Admission: RE | Admit: 2022-02-14 | Discharge: 2022-02-14 | Disposition: A | Discharge status: Home / Self Care | Payer: PPO | Source: Ambulatory Visit | Attending: Adult Health | Admitting: Adult Health

## 2022-02-14 ENCOUNTER — Ambulatory Visit (INDEPENDENT_AMBULATORY_CARE_PROVIDER_SITE_OTHER): Payer: PPO | Admitting: Adult Health

## 2022-02-14 VITALS — BP 114/79 | HR 81 | Ht 65.5 in | Wt 184.0 lb

## 2022-02-14 DIAGNOSIS — Z79890 Hormone replacement therapy (postmenopausal): Secondary | ICD-10-CM | POA: Diagnosis not present

## 2022-02-14 DIAGNOSIS — Z780 Asymptomatic menopausal state: Secondary | ICD-10-CM | POA: Diagnosis not present

## 2022-02-14 DIAGNOSIS — Z01419 Encounter for gynecological examination (general) (routine) without abnormal findings: Secondary | ICD-10-CM | POA: Diagnosis not present

## 2022-02-14 DIAGNOSIS — D869 Sarcoidosis, unspecified: Secondary | ICD-10-CM

## 2022-02-14 DIAGNOSIS — Z1151 Encounter for screening for human papillomavirus (HPV): Secondary | ICD-10-CM | POA: Diagnosis not present

## 2022-02-15 LAB — CYTOLOGY - PAP
Adequacy: ABSENT
Comment: NEGATIVE
Diagnosis: NEGATIVE
High risk HPV: NEGATIVE

## 2022-03-31 ENCOUNTER — Telehealth: Payer: Self-pay | Admitting: Pulmonary Disease

## 2022-04-01 NOTE — Telephone Encounter (Signed)
Routing encounter to Dr. Elsworth Soho as an Juluis Rainier.

## 2022-04-07 ENCOUNTER — Other Ambulatory Visit (HOSPITAL_COMMUNITY)
Admission: RE | Admit: 2022-04-07 | Discharge: 2022-04-07 | Disposition: A | Discharge status: Home / Self Care | Payer: PPO | Source: Ambulatory Visit | Attending: Internal Medicine | Admitting: Internal Medicine

## 2022-04-07 DIAGNOSIS — N950 Postmenopausal bleeding: Secondary | ICD-10-CM | POA: Insufficient documentation

## 2022-04-07 DIAGNOSIS — K219 Gastro-esophageal reflux disease without esophagitis: Secondary | ICD-10-CM | POA: Insufficient documentation

## 2022-04-07 DIAGNOSIS — D45 Polycythemia vera: Secondary | ICD-10-CM | POA: Insufficient documentation

## 2022-04-07 DIAGNOSIS — E785 Hyperlipidemia, unspecified: Secondary | ICD-10-CM | POA: Diagnosis not present

## 2022-04-07 LAB — COMPREHENSIVE METABOLIC PANEL
ALT: 18 U/L (ref 0–44)
AST: 21 U/L (ref 15–41)
Albumin: 3.9 g/dL (ref 3.5–5.0)
Alkaline Phosphatase: 110 U/L (ref 38–126)
Anion gap: 8 (ref 5–15)
BUN: 13 mg/dL (ref 8–23)
CO2: 23 mmol/L (ref 22–32)
Calcium: 9.4 mg/dL (ref 8.9–10.3)
Chloride: 109 mmol/L (ref 98–111)
Creatinine, Ser: 0.98 mg/dL (ref 0.44–1.00)
GFR, Estimated: >60 mL/min (ref >60)
Glucose, Bld: 94 mg/dL (ref 70–99)
Potassium: 4 mmol/L (ref 3.5–5.1)
Sodium: 140 mmol/L (ref 135–145)
Total Bilirubin: 0.6 mg/dL (ref 0.3–1.2)
Total Protein: 7.5 g/dL (ref 6.5–8.1)

## 2022-04-07 LAB — CBC WITH DIFFERENTIAL/PLATELET
Abs Immature Granulocytes: 0.03 10*3/uL (ref 0.00–0.07)
Basophils Absolute: 0 10*3/uL (ref 0.0–0.1)
Basophils Relative: 1 %
Eosinophils Absolute: 0.1 10*3/uL (ref 0.0–0.5)
Eosinophils Relative: 3 %
HCT: 46.8 % — ABNORMAL HIGH (ref 36.0–46.0)
Hemoglobin: 15.7 g/dL — ABNORMAL HIGH (ref 12.0–15.0)
Immature Granulocytes: 1 %
Lymphocytes Relative: 27 %
Lymphs Abs: 1 10*3/uL (ref 0.7–4.0)
MCH: 29.3 pg (ref 26.0–34.0)
MCHC: 33.5 g/dL (ref 30.0–36.0)
MCV: 87.5 fL (ref 80.0–100.0)
Monocytes Absolute: 0.5 10*3/uL (ref 0.1–1.0)
Monocytes Relative: 13 %
Neutro Abs: 2 10*3/uL (ref 1.7–7.7)
Neutrophils Relative %: 55 %
Platelets: 308 10*3/uL (ref 150–400)
RBC: 5.35 MIL/uL — ABNORMAL HIGH (ref 3.87–5.11)
RDW: 12.4 % (ref 11.5–15.5)
WBC: 3.6 10*3/uL — ABNORMAL LOW (ref 4.0–10.5)
nRBC: 0 % (ref 0.0–0.2)

## 2022-04-07 LAB — URINALYSIS, ROUTINE W REFLEX MICROSCOPIC
Bilirubin Urine: NEGATIVE
Glucose, UA: NEGATIVE mg/dL
Hgb urine dipstick: NEGATIVE
Ketones, ur: NEGATIVE mg/dL
Leukocytes,Ua: NEGATIVE
Nitrite: NEGATIVE
Protein, ur: NEGATIVE mg/dL
Specific Gravity, Urine: 1.013 (ref 1.005–1.030)
pH: 5 (ref 5.0–8.0)

## 2022-04-07 LAB — LIPID PANEL
Cholesterol: 286 mg/dL — ABNORMAL HIGH (ref 0–200)
HDL: 59 mg/dL (ref >40)
LDL Cholesterol: 211 mg/dL — ABNORMAL HIGH (ref 0–99)
Total CHOL/HDL Ratio: 4.8 RATIO
Triglycerides: 81 mg/dL (ref <150)
VLDL: 16 mg/dL (ref 0–40)

## 2022-04-18 ENCOUNTER — Other Ambulatory Visit (HOSPITAL_COMMUNITY): Payer: Self-pay | Admitting: Internal Medicine

## 2022-04-18 DIAGNOSIS — I10 Essential (primary) hypertension: Secondary | ICD-10-CM | POA: Diagnosis not present

## 2022-04-18 DIAGNOSIS — K219 Gastro-esophageal reflux disease without esophagitis: Secondary | ICD-10-CM | POA: Diagnosis not present

## 2022-04-18 DIAGNOSIS — Z6828 Body mass index (BMI) 28.0-28.9, adult: Secondary | ICD-10-CM | POA: Diagnosis not present

## 2022-04-18 DIAGNOSIS — Z9884 Bariatric surgery status: Secondary | ICD-10-CM | POA: Diagnosis not present

## 2022-04-18 DIAGNOSIS — Z0001 Encounter for general adult medical examination with abnormal findings: Secondary | ICD-10-CM | POA: Diagnosis not present

## 2022-04-18 DIAGNOSIS — D869 Sarcoidosis, unspecified: Secondary | ICD-10-CM | POA: Diagnosis not present

## 2022-04-18 DIAGNOSIS — E785 Hyperlipidemia, unspecified: Secondary | ICD-10-CM | POA: Diagnosis not present

## 2022-04-18 DIAGNOSIS — Z780 Asymptomatic menopausal state: Secondary | ICD-10-CM

## 2022-05-12 ENCOUNTER — Ambulatory Visit (HOSPITAL_COMMUNITY)
Admission: RE | Admit: 2022-05-12 | Discharge: 2022-05-12 | Disposition: A | Discharge status: Home / Self Care | Payer: PPO | Source: Ambulatory Visit | Attending: Internal Medicine | Admitting: Internal Medicine

## 2022-05-12 DIAGNOSIS — Z780 Asymptomatic menopausal state: Secondary | ICD-10-CM | POA: Insufficient documentation

## 2022-05-12 DIAGNOSIS — M85851 Other specified disorders of bone density and structure, right thigh: Secondary | ICD-10-CM | POA: Diagnosis not present

## 2022-05-30 ENCOUNTER — Telehealth: Payer: Self-pay | Admitting: Pulmonary Disease

## 2022-05-30 NOTE — Telephone Encounter (Signed)
Patient called office to inform us that she does not want to have CT scan that Dr Elsworth Soho wanted to get done in December.   Please advises sir

## 2022-05-30 NOTE — Telephone Encounter (Signed)
Called and spoke to patient. Offered to schedule an ov to discuss CT scan and to follow up in general on how she is doing. She declines an OV at this time. She states she does not have any current issues/ concerns and doesn't want to come in and pay a Co-pay if she is not having any issues. I voiced understanding and advised patient to call our office back if she changes her mind. Nothing further needed at this time

## 2022-07-22 ENCOUNTER — Other Ambulatory Visit: Payer: PPO | Admitting: Surgical

## 2022-07-26 ENCOUNTER — Other Ambulatory Visit (HOSPITAL_COMMUNITY): Payer: PPO

## 2022-08-02 ENCOUNTER — Ambulatory Visit: Payer: PPO | Admitting: Pulmonary Disease

## 2022-08-16 ENCOUNTER — Ambulatory Visit (HOSPITAL_COMMUNITY): Payer: PPO

## 2022-10-03 ENCOUNTER — Encounter (HOSPITAL_COMMUNITY): Payer: Self-pay | Admitting: *Deleted

## 2022-10-10 ENCOUNTER — Other Ambulatory Visit (HOSPITAL_COMMUNITY): Payer: Self-pay | Admitting: Internal Medicine

## 2022-10-10 DIAGNOSIS — Z1231 Encounter for screening mammogram for malignant neoplasm of breast: Secondary | ICD-10-CM

## 2022-10-19 ENCOUNTER — Other Ambulatory Visit: Payer: Self-pay | Admitting: *Deleted

## 2022-10-19 MED ORDER — CLIMARA PRO 0.045-0.015 MG/DAY TD PTWK: MEDICATED_PATCH | TRANSDERMAL | 4 refills | Status: DC

## 2022-10-20 ENCOUNTER — Other Ambulatory Visit: Payer: Self-pay | Admitting: *Deleted

## 2022-10-21 MED ORDER — SERTRALINE HCL 50 MG PO TABS: 50.0000 mg | ORAL_TABLET | Freq: Every day | ORAL | 1 refills | Status: DC

## 2022-11-21 ENCOUNTER — Ambulatory Visit (HOSPITAL_COMMUNITY): Payer: PPO

## 2022-11-30 ENCOUNTER — Encounter (HOSPITAL_COMMUNITY): Payer: Self-pay

## 2022-11-30 ENCOUNTER — Ambulatory Visit (HOSPITAL_COMMUNITY)
Admission: RE | Admit: 2022-11-30 | Discharge: 2022-11-30 | Disposition: A | Discharge status: Home / Self Care | Payer: PPO | Source: Ambulatory Visit | Attending: Internal Medicine | Admitting: Internal Medicine

## 2022-11-30 DIAGNOSIS — Z1231 Encounter for screening mammogram for malignant neoplasm of breast: Secondary | ICD-10-CM

## 2022-12-01 ENCOUNTER — Other Ambulatory Visit: Payer: Self-pay

## 2022-12-01 ENCOUNTER — Other Ambulatory Visit (HOSPITAL_COMMUNITY): Payer: Self-pay

## 2022-12-01 ENCOUNTER — Telehealth: Payer: Self-pay

## 2022-12-01 MED ORDER — DICYCLOMINE HCL 10 MG PO CAPS: 10.0000 mg | ORAL_CAPSULE | Freq: Three times a day (TID) | ORAL | 0 refills | Status: DC

## 2022-12-01 MED ORDER — DICYCLOMINE HCL 10 MG PO CAPS
10.0000 mg | ORAL_CAPSULE | Freq: Three times a day (TID) | ORAL | 0 refills | Status: DC
  Filled 2022-12-01 (×2): qty 120, 30d supply, fill #0

## 2022-12-01 NOTE — Telephone Encounter (Signed)
Courtney Bradley, The pt just phoned back to tell us that she wants Dicyclomine sent to [facility] which she now use. Has been changed in the system

## 2022-12-01 NOTE — Telephone Encounter (Signed)
Pt wants a Rx sent in to her pharmacy [facility]) for Dicyclomine. She states she is going out of town. Pt has not been seen since 2022. She had appt 11-24-2021 but cancelled it. Her last ov was 02/2021 with you. Please advise

## 2022-12-01 NOTE — Addendum Note (Signed)
Addended by: Tiffany Kocher on: 12/01/2022 12:25 PM   Modules accepted: Orders

## 2022-12-01 NOTE — Addendum Note (Signed)
Addended by: Tiffany Kocher on: 12/01/2022 02:27 PM   Modules accepted: Orders

## 2022-12-01 NOTE — Telephone Encounter (Signed)
She will need ov by 02/2023 for additional refills per office policy. I gave her one refill now.

## 2022-12-01 NOTE — Telephone Encounter (Signed)
Rx sent to [facility] pharmacy

## 2022-12-01 NOTE — Telephone Encounter (Signed)
Advised the pt of her Rx being sent to her pharmacy. This was also sent in her MyChart

## 2022-12-09 DIAGNOSIS — J029 Acute pharyngitis, unspecified: Secondary | ICD-10-CM | POA: Diagnosis not present

## 2022-12-15 ENCOUNTER — Telehealth: Payer: Self-pay | Admitting: Adult Health

## 2022-12-15 MED ORDER — SERTRALINE HCL 50 MG PO TABS: 50.0000 mg | ORAL_TABLET | Freq: Every day | ORAL | 0 refills | Status: DC

## 2022-12-15 NOTE — Telephone Encounter (Signed)
Pt states she is on vacation and forgot her Zoloft. She would like a couple of pills (5) called into the [facility] in [place], [place] [phone]. Please advise

## 2022-12-15 NOTE — Telephone Encounter (Signed)
Courtney Bradley is at [employer] and forgot zoloft, will rx Zoloft 50 mg #5 at [facility]

## 2023-01-02 ENCOUNTER — Ambulatory Visit (HOSPITAL_COMMUNITY): Payer: PPO

## 2023-01-09 ENCOUNTER — Ambulatory Visit (HOSPITAL_COMMUNITY): Payer: PPO

## 2023-02-14 ENCOUNTER — Other Ambulatory Visit: Payer: Self-pay | Admitting: Gastroenterology

## 2023-02-21 ENCOUNTER — Ambulatory Visit: Payer: PPO | Admitting: Adult Health

## 2023-04-18 ENCOUNTER — Ambulatory Visit (INDEPENDENT_AMBULATORY_CARE_PROVIDER_SITE_OTHER): Payer: PPO | Admitting: Adult Health

## 2023-04-18 ENCOUNTER — Encounter: Payer: Self-pay | Admitting: Adult Health

## 2023-04-18 VITALS — BP 118/82 | HR 65 | Ht 66.0 in | Wt 157.0 lb

## 2023-04-18 DIAGNOSIS — Z780 Asymptomatic menopausal state: Secondary | ICD-10-CM | POA: Diagnosis not present

## 2023-04-18 DIAGNOSIS — Z01419 Encounter for gynecological examination (general) (routine) without abnormal findings: Secondary | ICD-10-CM | POA: Diagnosis not present

## 2023-04-18 DIAGNOSIS — Z79890 Hormone replacement therapy (postmenopausal): Secondary | ICD-10-CM

## 2023-04-18 DIAGNOSIS — Z1211 Encounter for screening for malignant neoplasm of colon: Secondary | ICD-10-CM | POA: Diagnosis not present

## 2023-04-18 LAB — HEMOCCULT GUIAC POC 1CARD (OFFICE): Fecal Occult Blood, POC: NEGATIVE

## 2023-04-18 NOTE — Progress Notes (Signed)
Patient ID: Courtney Bradley, female   DOB: 1951/06/08, 72 y.o.   MRN: [id number] History of Present Illness: Courtney Bradley is a 72 year old white female, married, PM in for a well woman gyn exam. She is on climara pro patch. She has lost weight on GLP1 injections. She feels off balance or not as stable at times. She is still working about 2 1/2 days a  week.     Component Value Date/Time   DIAGPAP  02/14/2022 0856    - Negative for intraepithelial lesion or malignancy (NILM)   DIAGPAP  12/22/2017 0000    NEGATIVE FOR INTRAEPITHELIAL LESIONS OR MALIGNANCY.   DIAGPAP  12/22/2017 0000    FUNGAL ORGANISMS PRESENT CONSISTENT WITH CANDIDA SPP.   HPVHIGH Negative 02/14/2022 0856   ADEQPAP  02/14/2022 0856    Satisfactory for evaluation; transformation zone component ABSENT.   ADEQPAP  12/22/2017 0000    Satisfactory for evaluation  endocervical/transformation zone component PRESENT.    PCP is Dr Ouida Sills.   Current Medications, Allergies, Past Medical History, Past Surgical History, Family History and Social History were reviewed in [employer] record.     Review of Systems: Patient denies any headaches, hearing loss, fatigue, blurred vision, shortness of breath, chest pain, abdominal pain, problems with bowel movements(has diarrhea if not taking bentyl) urination, or intercourse(not active). No joint pain or mood swings.  Denies any vaginal bleeding She may wean off zoloft  See HPI for positives   Physical Exam:BP 118/82 (BP Location: Left Arm, Patient Position: Sitting, Cuff Size: Normal)   Pulse 65   Ht 5\' 6"  (1.676 m)   Wt 157 lb (71.2 kg)   BMI 25.34 kg/m   General:  Well developed, well nourished, no acute distress Skin:  Warm and dry Neck:  Midline trachea, normal thyroid, good ROM, has nodules in neck with negative biopsy, no carotid bruits heard Lungs; Clear to auscultation bilaterally Breast:  No dominant palpable mass, retraction, or nipple  discharge Cardiovascular: Regular rate and rhythm Abdomen:  Soft, non tender, no hepatosplenomegaly Pelvic:  External genitalia is normal in appearance, no lesions.  The vagina is pale. Urethra has no lesions or masses. The cervix is smooth.  Uterus is felt to be normal size, shape, and contour.  No adnexal masses or tenderness noted.Bladder is non tender, no masses felt. Rectal: Good sphincter tone, no polyps, or hemorrhoids felt.  Hemoccult negative.+rectocele  Extremities/musculoskeletal:  No swelling or varicosities noted, no clubbing or cyanosis Psych:  No mood changes, alert and cooperative,seems happy AA is 1 Fall risk is low    04/18/2023   10:53 AM 02/14/2022    8:49 AM 12/27/2019    9:19 AM  Depression screen PHQ 2/9  Decreased Interest 0 0 0  Down, Depressed, Hopeless 0 0 0  PHQ - 2 Score 0 0 0  Altered sleeping 0 0 0  Tired, decreased energy 1 0 1  Change in appetite 0 0 1  Feeling bad or failure about yourself  0 0 0  Trouble concentrating 0 0 0  Moving slowly or fidgety/restless 0 0 0  Suicidal thoughts 0 0 0  PHQ-9 Score 1 0 2  Difficult doing work/chores   Not difficult at all       04/18/2023   10:53 AM 02/14/2022    8:49 AM 12/27/2019    9:19 AM  GAD 7 : Generalized Anxiety Score  Nervous, Anxious, on Edge 0 0 0  Control/stop worrying 0 0 0  Worry too much - different things 0 0 0  Trouble relaxing 0 0 0  Restless 0 0 0  Easily annoyed or irritable 0 0 0  Afraid - awful might happen 0 0 0  Total GAD 7 Score 0 0 0  Anxiety Difficulty   Not difficult at all      Upstream - 04/18/23 1058       Pregnancy Intention Screening   Does the patient want to become pregnant in the next year? N/A    Does the patient's partner want to become pregnant in the next year? N/A    Would the patient like to discuss contraceptive options today? N/A      Contraception Wrap Up   Current Method No Method - Other Reason   postmenopausal   Reason for No Current Contraceptive  Method at Intake ([facility] Only) Other    End Method No Method - Other Reason   postmenopausal   Contraception Counseling Provided No            Examination chaperoned by Malachy Mood LPN   Impression and Plan: 1. Encounter for well woman exam with routine gynecological exam Physical in 1 year No more paps needed Labs with PCP Mammogram was negative 12/01/22 Colonoscopy per GI   2. Encounter for screening fecal occult blood testing Hemoccult was negative   3. Hormone replacement therapy (HRT) On climara pro patch, has hot flashes if not taking, but does not change every week She has refills    4. Postmenopause Denies any bleeding

## 2023-05-05 DIAGNOSIS — K219 Gastro-esophageal reflux disease without esophagitis: Secondary | ICD-10-CM | POA: Diagnosis not present

## 2023-05-05 DIAGNOSIS — D750 Familial erythrocytosis: Secondary | ICD-10-CM | POA: Diagnosis not present

## 2023-05-05 DIAGNOSIS — E875 Hyperkalemia: Secondary | ICD-10-CM | POA: Diagnosis not present

## 2023-05-05 DIAGNOSIS — Z79899 Other long term (current) drug therapy: Secondary | ICD-10-CM | POA: Diagnosis not present

## 2023-05-05 DIAGNOSIS — D869 Sarcoidosis, unspecified: Secondary | ICD-10-CM | POA: Diagnosis not present

## 2023-05-05 DIAGNOSIS — N951 Menopausal and female climacteric states: Secondary | ICD-10-CM | POA: Diagnosis not present

## 2023-05-17 ENCOUNTER — Other Ambulatory Visit: Payer: Self-pay | Admitting: Gastroenterology

## 2023-06-01 DIAGNOSIS — K219 Gastro-esophageal reflux disease without esophagitis: Secondary | ICD-10-CM | POA: Diagnosis not present

## 2023-06-01 DIAGNOSIS — E785 Hyperlipidemia, unspecified: Secondary | ICD-10-CM | POA: Diagnosis not present

## 2023-06-01 DIAGNOSIS — D860 Sarcoidosis of lung: Secondary | ICD-10-CM | POA: Diagnosis not present

## 2023-06-05 ENCOUNTER — Other Ambulatory Visit (HOSPITAL_COMMUNITY): Payer: Self-pay | Admitting: Internal Medicine

## 2023-06-05 DIAGNOSIS — E785 Hyperlipidemia, unspecified: Secondary | ICD-10-CM

## 2023-07-05 ENCOUNTER — Ambulatory Visit (HOSPITAL_COMMUNITY): Payer: PPO

## 2023-07-05 ENCOUNTER — Encounter (HOSPITAL_COMMUNITY): Payer: Self-pay

## 2023-08-01 ENCOUNTER — Ambulatory Visit (HOSPITAL_COMMUNITY)
Admission: RE | Admit: 2023-08-01 | Discharge: 2023-08-01 | Disposition: A | Discharge status: Home / Self Care | Payer: PPO | Source: Ambulatory Visit | Attending: Internal Medicine | Admitting: Internal Medicine

## 2023-08-01 DIAGNOSIS — E785 Hyperlipidemia, unspecified: Secondary | ICD-10-CM | POA: Insufficient documentation

## 2023-08-23 DIAGNOSIS — D049 Carcinoma in situ of skin, unspecified: Secondary | ICD-10-CM

## 2023-08-23 HISTORY — DX: Carcinoma in situ of skin, unspecified: D04.9

## 2023-10-10 ENCOUNTER — Ambulatory Visit: Payer: HMO | Attending: Internal Medicine | Admitting: Internal Medicine

## 2023-10-10 ENCOUNTER — Encounter: Payer: Self-pay | Admitting: Internal Medicine

## 2023-10-10 VITALS — BP 118/72 | HR 72 | Ht 66.0 in | Wt 148.4 lb

## 2023-10-10 DIAGNOSIS — Z8249 Family history of ischemic heart disease and other diseases of the circulatory system: Secondary | ICD-10-CM

## 2023-10-10 DIAGNOSIS — Z136 Encounter for screening for cardiovascular disorders: Secondary | ICD-10-CM

## 2023-10-10 DIAGNOSIS — E785 Hyperlipidemia, unspecified: Secondary | ICD-10-CM | POA: Diagnosis not present

## 2023-10-10 DIAGNOSIS — R931 Abnormal findings on diagnostic imaging of heart and coronary circulation: Secondary | ICD-10-CM

## 2023-10-10 NOTE — Patient Instructions (Signed)
 Medication Instructions:  Your physician recommends that you continue on your current medications as directed. Please refer to the Current Medication list given to you today.   Labwork: Lipoprotein-a to be completed at [facility]  Testing/Procedures: None  Follow-Up: Your physician recommends that you schedule a follow-up appointment in: Follow up as needed  Any Other Special Instructions Will Be Listed Below (If Applicable). Thank you for choosing [facility]!     If you need a refill on your cardiac medications before your next appointment, please call your pharmacy.

## 2023-10-10 NOTE — Progress Notes (Unsigned)
 Cardiology Office Note  Date: 10/10/2023   ID: Courtney Bradley, Courtney Bradley Jun 29, 1951, MRN [id number]  PCP:  Courtney Perches, MD  Cardiologist:  None Electrophysiologist:  None   History of Present Illness: Courtney Bradley is a 73 y.o. 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   Elevated cholesterol 11/28/2016   GERD (gastroesophageal reflux disease)    History of kidney stones    Hormone replacement therapy (HRT) 09/19/2014   Lymphadenopathy    Thoracic, followed by Courtney Bradley   Obesity    Osteopenia    Pneumonia    PONV (postoperative nausea and vomiting)    Sarcoidosis     Past Surgical History:  Procedure Laterality Date   BIOPSY  04/09/2021   Procedure: BIOPSY;  Surgeon: Courtney Bal, DO;  [facility];  Service: Endoscopy;;   BREAST CYST ASPIRATION     CHOLECYSTECTOMY     COLONOSCOPY N/A 11/17/2016   Procedure: COLONOSCOPY;  Surgeon: Courtney Hippo, MD;  [facility];  Service: Endoscopy;  Laterality: N/A;  830   COLONOSCOPY WITH PROPOFOL N/A 04/09/2021   Procedure: COLONOSCOPY WITH PROPOFOL;  Surgeon: Courtney Bal, DO;  [facility];  Service: Endoscopy;  Laterality: N/A;  8:30am   CORRECTION HAMMER TOE Bilateral    2nd   EYE SURGERY     GALLBLADDER SURGERY     LAPAROSCOPIC GASTRIC SLEEVE RESECTION WITH HIATAL HERNIA REPAIR N/A 03/14/2017   Procedure: LAPAROSCOPIC GASTRIC SLEEVE RESECTION WITH HIATAL HERNIA REPAIR;  Surgeon: Courtney Adu, MD;  [facility];  Service: General;  Laterality: N/A;   LITHOTRIPSY     neck lift     POLYPECTOMY  04/09/2021   Procedure: POLYPECTOMY INTESTINAL;  Surgeon: Courtney Bal, DO;  [facility];  Service: Endoscopy;;   UPPER GI ENDOSCOPY  03/14/2017   Procedure: UPPER GI ENDOSCOPY;  Surgeon: Courtney Adu, MD;  [facility];  Service: General;;    Current Outpatient Medications  Medication Sig Dispense Refill    estradiol-levonorgestrel (CLIMARA PRO) 0.045-0.015 MG/DAY PLACE 1 PATCH ONTO THE SKIN ONCE A WEEK 12 patch 4   Multiple Vitamin (MULTIVITAMIN) tablet Take 1 tablet by mouth daily.     omeprazole (PRILOSEC) 20 MG capsule Take 20 mg by mouth daily.     OMEPRAZOLE PO Take by mouth.     rosuvastatin (CRESTOR) 20 MG tablet Take 20 mg by mouth at bedtime.     sertraline (ZOLOFT) 50 MG tablet Take 1 tablet (50 mg total) by mouth at bedtime. 5 tablet 0   tirzepatide (MOUNJARO) 7.5 MG/0.5ML Pen Inject 7.5 mg into the skin every 14 (fourteen) days.     TURMERIC PO Take by mouth daily.     No current facility-administered medications for this visit.   Allergies:  Penicillins   Social History: The patient  reports that she has never smoked. She has never used smokeless tobacco. She reports current alcohol use. She reports that she does not use drugs.   Family History: The patient's family history includes Alzheimer's disease in her father; Cancer in her father, maternal aunt, maternal aunt, mother, and paternal aunt; Coronary artery disease in her mother; Diabetes in her daughter, half-sister, and mother; Heart disease in her mother and paternal grandmother; Hypercholesterolemia in her brother; Hypertension in her daughter; Irritable bowel syndrome in her daughter; Obesity in her daughter; Other in her brother; Suicidality in her maternal grandfather.  ROS:  Please see the history of present illness. Otherwise, complete review of systems is positive for none  All other systems are reviewed and negative.   Physical Exam: VS:  BP 118/72   Pulse 72   Ht 5\' 6"  (1.676 m)   Wt 148 lb 6.4 oz (67.3 kg)   SpO2 97%   BMI 23.95 kg/m , BMI Body mass index is 23.95 kg/m.  Wt Readings from Last 3 Encounters:  10/10/23 148 lb 6.4 oz (67.3 kg)  04/18/23 157 lb (71.2 kg)  02/14/22 184 lb (83.5 kg)    General: Patient appears comfortable at rest. HEENT: Conjunctiva and lids normal, oropharynx clear with moist  mucosa. Neck: Supple, no elevated JVP or carotid bruits, no thyromegaly. Lungs: Clear to auscultation, nonlabored breathing at rest. Cardiac: Regular rate and rhythm, no S3 or significant systolic murmur, no pericardial rub. Abdomen: Soft, nontender, no hepatomegaly, bowel sounds present, no guarding or rebound. Extremities: No pitting edema, distal pulses 2+. Skin: Warm and dry. Musculoskeletal: No kyphosis. Neuropsychiatric: Alert and oriented x3, affect grossly appropriate.  Recent Labwork: No results found for requested labs within last 365 days.     Component Value Date/Time   CHOL 286 (H) 04/07/2022 0811   CHOL 183 09/28/2017 0814   TRIG 81 04/07/2022 0811   HDL 59 04/07/2022 0811   HDL 63 09/28/2017 0814   CHOLHDL 4.8 04/07/2022 0811   VLDL 16 04/07/2022 0811   LDLCALC 211 (H) 04/07/2022 0811   LDLCALC 102 (H) 09/28/2017 0981    Other Studies Reviewed Today:   Assessment and Plan:   Elevated coronary calcium score, 142 (75th percentile for age and sex matched control)       Medication Adjustments/Labs and Tests Ordered: Current medicines are reviewed at length with the patient today.  Concerns regarding medicines are outlined above.    Disposition:  Follow up {follow up:15908}  Signed Courtney Scrivens Verne Spurr, MD, 10/10/2023 2:51 PM    [facility] at [facility] [address] [place], [place], [place] [address]

## 2023-10-11 DIAGNOSIS — R931 Abnormal findings on diagnostic imaging of heart and coronary circulation: Secondary | ICD-10-CM | POA: Insufficient documentation

## 2023-10-11 DIAGNOSIS — Z8249 Family history of ischemic heart disease and other diseases of the circulatory system: Secondary | ICD-10-CM | POA: Insufficient documentation

## 2023-10-13 ENCOUNTER — Other Ambulatory Visit (HOSPITAL_COMMUNITY)
Admission: RE | Admit: 2023-10-13 | Discharge: 2023-10-13 | Disposition: A | Discharge status: Home / Self Care | Payer: HMO | Source: Ambulatory Visit | Attending: Internal Medicine | Admitting: Internal Medicine

## 2023-10-13 DIAGNOSIS — E785 Hyperlipidemia, unspecified: Secondary | ICD-10-CM | POA: Insufficient documentation

## 2023-10-13 DIAGNOSIS — Z136 Encounter for screening for cardiovascular disorders: Secondary | ICD-10-CM | POA: Diagnosis not present

## 2023-10-17 LAB — LIPOPROTEIN A (LPA): Lipoprotein (a): 31.9 nmol/L — ABNORMAL HIGH (ref <75.0)

## 2023-10-20 ENCOUNTER — Other Ambulatory Visit: Payer: PPO | Admitting: Plastic Surgery

## 2023-10-23 ENCOUNTER — Other Ambulatory Visit: Payer: Self-pay | Admitting: Adult Health

## 2023-11-06 ENCOUNTER — Other Ambulatory Visit (HOSPITAL_COMMUNITY): Payer: Self-pay | Admitting: Internal Medicine

## 2023-11-06 DIAGNOSIS — Z1231 Encounter for screening mammogram for malignant neoplasm of breast: Secondary | ICD-10-CM

## 2023-11-18 ENCOUNTER — Other Ambulatory Visit: Payer: Self-pay | Admitting: Adult Health

## 2023-12-13 ENCOUNTER — Ambulatory Visit (HOSPITAL_COMMUNITY)

## 2023-12-13 ENCOUNTER — Ambulatory Visit (HOSPITAL_COMMUNITY)
Admission: RE | Admit: 2023-12-13 | Discharge: 2023-12-13 | Disposition: A | Discharge status: Home / Self Care | Source: Ambulatory Visit | Attending: Internal Medicine | Admitting: Internal Medicine

## 2023-12-13 DIAGNOSIS — Z1231 Encounter for screening mammogram for malignant neoplasm of breast: Secondary | ICD-10-CM | POA: Diagnosis not present

## 2023-12-21 DIAGNOSIS — S22080A Wedge compression fracture of T11-T12 vertebra, initial encounter for closed fracture: Secondary | ICD-10-CM

## 2023-12-21 HISTORY — DX: Wedge compression fracture of T11-T12 vertebra, initial encounter for closed fracture: S22.080A

## 2023-12-25 ENCOUNTER — Ambulatory Visit (HOSPITAL_COMMUNITY)

## 2023-12-28 ENCOUNTER — Ambulatory Visit
Admission: EM | Admit: 2023-12-28 | Discharge: 2023-12-28 | Disposition: A | Discharge status: Home / Self Care | Payer: Worker's Compensation | Attending: Family Medicine | Admitting: Family Medicine

## 2023-12-28 DIAGNOSIS — S39012A Strain of muscle, fascia and tendon of lower back, initial encounter: Secondary | ICD-10-CM

## 2023-12-28 MED ORDER — DEXAMETHASONE SODIUM PHOSPHATE 10 MG/ML IJ SOLN
10.0000 mg | Freq: Once | INTRAMUSCULAR | Status: AC
  Administered 2023-12-28: 10 mg via INTRAMUSCULAR

## 2023-12-28 NOTE — ED Triage Notes (Signed)
 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 X 1 week, has tried home remedies and OTC meds but has found no relief.

## 2023-12-28 NOTE — ED Provider Notes (Signed)
 [facility]    CSN: [id number] Arrival date & time: 12/28/23  1037      History   Chief Complaint No chief complaint on file.   HPI Courtney Bradley is a 73 y.o. female.   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  She states the pain is mainly on the left lateral aspect but sometimes wraps all the way to the right.  She denies radiation of pain down legs, numbness, tingling, weakness, bowel or bladder incontinence, saddle anesthesia, fevers, urinary symptoms.  Has tried ice, heat, over-the-counter pain relievers with only minimal relief.  States her PCP called her in some Flexeril that she took for a day or so but was told to do off on taking anymore until she was evaluated here.    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   Elevated cholesterol 11/28/2016   GERD (gastroesophageal reflux disease)    History of kidney stones    Hormone replacement therapy (HRT) 09/19/2014   Lymphadenopathy    Thoracic, followed by Bethanie Brooking   Obesity    Osteopenia    Pneumonia    PONV (postoperative nausea and vomiting)    Sarcoidosis     Patient Active Problem List   Diagnosis Date Noted   Elevated coronary artery calcium score 10/11/2023   Family history of premature CAD 10/11/2023   Encounter for screening fecal occult blood testing 04/18/2023   Encounter for gynecological examination with Papanicolaou smear of cervix 02/14/2022   Postmenopause 02/14/2022   Diarrhea 03/15/2021   Change in bowel function 03/15/2021   Sarcoidosis 10/09/2020   Erythrocytosis 01/22/2020   History of bariatric surgery 12/27/2019   Encounter for well woman exam with routine gynecological exam 12/27/2019   Gastroesophageal reflux disease 12/22/2017   Screening for colorectal cancer 12/22/2017   Encounter for counseling 12/22/2017   Morbid obesity  (HCC) 03/14/2017   Osteopenia 03/14/2017   Dyslipidemia 03/14/2017   GERD (gastroesophageal reflux disease) 03/14/2017   Elevated cholesterol 11/28/2016   Special screening for malignant neoplasms, colon 08/11/2016   Well woman exam with routine gynecological exam 07/22/2016   Weight gain 07/22/2016   Hormone replacement therapy (HRT) 09/19/2014   Fatigue fracture of vertebra of sacral and sacrococcygeal region 01/26/2011   Disease of nail 06/12/2008   Closed fracture of sacrum and coccyx (HCC) 06/18/2007    Past Surgical History:  Procedure Laterality Date   BIOPSY  04/09/2021   Procedure: BIOPSY;  Surgeon: Vinetta Greening, DO;  [facility];  Service: Endoscopy;;   BREAST CYST ASPIRATION     CHOLECYSTECTOMY     COLONOSCOPY N/A 11/17/2016   Procedure: COLONOSCOPY;  Surgeon: Ruby Corporal, MD;  [facility];  Service: Endoscopy;  Laterality: N/A;  830   COLONOSCOPY WITH PROPOFOL  N/A 04/09/2021   Procedure: COLONOSCOPY WITH PROPOFOL ;  Surgeon: Vinetta Greening, DO;  [facility];  Service: Endoscopy;  Laterality: N/A;  8:30am   CORRECTION HAMMER TOE Bilateral    2nd   EYE SURGERY     GALLBLADDER SURGERY     LAPAROSCOPIC GASTRIC SLEEVE RESECTION WITH HIATAL HERNIA REPAIR N/A 03/14/2017   Procedure: LAPAROSCOPIC GASTRIC SLEEVE RESECTION WITH HIATAL HERNIA REPAIR;  Surgeon: Aldean Hummingbird, MD;  [facility];  Service: General;  Laterality: N/A;   LITHOTRIPSY     neck lift  POLYPECTOMY  04/09/2021   Procedure: POLYPECTOMY INTESTINAL;  Surgeon: Vinetta Greening, DO;  [facility];  Service: Endoscopy;;   UPPER GI ENDOSCOPY  03/14/2017   Procedure: UPPER GI ENDOSCOPY;  Surgeon: Aldean Hummingbird, MD;  [facility];  Service: General;;    OB History     Gravida  2   Para  2   Term      Preterm      AB      Living  2      SAB      IAB      Ectopic      Multiple      Live Births               Home  Medications    Prior to Admission medications   Medication Sig Start Date End Date Taking? Authorizing Provider  estradiol -levonorgestrel  (CLIMARA  PRO) 0.045-0.015 MG/DAY PLACE ONE PATCH ONTO THE SKIN ONCE A WEEK. 11/20/23   Javan Messing, NP  Multiple Vitamin (MULTIVITAMIN) tablet Take 1 tablet by mouth daily.    [provider]  omeprazole (PRILOSEC) 20 MG capsule Take 20 mg by mouth daily.    [provider]  OMEPRAZOLE PO Take by mouth.    [provider]  rosuvastatin (CRESTOR) 20 MG tablet Take 20 mg by mouth at bedtime. 08/03/23   [provider]  sertraline  (ZOLOFT ) 50 MG tablet TAKE ONE TABLET (50MG  TOTAL) BY MOUTH ATBEDTIME 10/24/23   Javan Messing, NP  tirzepatide [facility]) 7.5 MG/0.5ML Pen Inject 7.5 mg into the skin every 14 (fourteen) days.    [provider]  TURMERIC PO Take by mouth daily.    [provider]    Family History Family History  Problem Relation Age of Onset   Heart disease Paternal Grandmother    Suicidality Maternal Grandfather    Alzheimer's disease Father    Cancer Father        prostate   Heart disease Mother    Diabetes Mother    Coronary artery disease Mother    Cancer Mother        Breast and thyroid  cancer   Hypercholesterolemia Brother    Other Brother        brain tumor   Obesity Daughter    Diabetes Daughter    Hypertension Daughter    Irritable bowel syndrome Daughter    Cancer Maternal Aunt        breast   Cancer Maternal Aunt        breast   Cancer Paternal Aunt        breast   Diabetes Half-Sister    Colon cancer Neg Hx    Celiac disease Neg Hx    Crohn's disease Neg Hx     Social History Social History   Tobacco Use   Smoking status: Never   Smokeless tobacco: Never  Vaping Use   Vaping status: Never Used  Substance Use Topics   Alcohol use: Yes    Alcohol/week: 0.0 standard drinks of alcohol    Comment: very seldom   Drug use: No     Allergies    Penicillins   Review of Systems Review of Systems Per HPI  Physical Exam Triage Vital Signs ED Triage Vitals  Encounter Vitals Group     BP 12/28/23 1055 103/71     Systolic BP Percentile --      Diastolic BP Percentile --  Pulse Rate 12/28/23 1055 74     Resp 12/28/23 1055 18     Temp 12/28/23 1055 98 F (36.7 C)     Temp Source 12/28/23 1055 Oral     SpO2 12/28/23 1055 97 %     Weight --      Height --      Head Circumference --      Peak Flow --      Pain Score 12/28/23 1059 7     Pain Loc --      Pain Education --      Exclude from Growth Chart --    No data found.  Updated Vital Signs BP 103/71 (BP Location: Right Arm)   Pulse 74   Temp 98 F (36.7 C) (Oral)   Resp 18   SpO2 97%   Visual Acuity Right Eye Distance:   Left Eye Distance:   Bilateral Distance:    Right Eye Near:   Left Eye Near:    Bilateral Near:     Physical Exam Vitals and nursing note reviewed.  Constitutional:      Appearance: Normal appearance. She is not ill-appearing.  HENT:     Head: Atraumatic.  Eyes:     Extraocular Movements: Extraocular movements intact.     Conjunctiva/sclera: Conjunctivae normal.  Cardiovascular:     Rate and Rhythm: Normal rate.  Pulmonary:     Effort: Pulmonary effort is normal.  Musculoskeletal:        General: Tenderness and signs of injury present. No swelling or deformity. Normal range of motion.     Cervical back: Normal range of motion and neck supple.     Comments: No midline spinal tenderness to palpation diffusely.  Negative straight leg raise bilateral lower extremities.  Bilateral lateral lumbar musculature tender to palpation  Skin:    General: Skin is warm and dry.     Findings: No bruising or erythema.  Neurological:     Mental Status: She is alert and oriented to person, place, and time.     Motor: No weakness.     Gait: Gait normal.     Comments: Bilateral lower extremities neurovascularly intact  Psychiatric:         Mood and Affect: Mood normal.        Thought Content: Thought content normal.        Judgment: Judgment normal.      UC Treatments / Results  Labs (all labs ordered are listed, but only abnormal results are displayed) Labs Reviewed - No data to display  EKG   Radiology No results found.  Procedures Procedures (including critical care time)  Medications Ordered in UC Medications  dexamethasone  (DECADRON ) injection 10 mg (10 mg Intramuscular Given 12/28/23 1129)    Initial Impression / Assessment and Plan / UC Course  I have reviewed the triage vital signs and the nursing notes.  Pertinent labs & imaging results that were available during my care of the patient were reviewed by me and considered in my medical decision making (see chart for details).     Suspect lumbar strain.  Treat with IM Decadron , recommended taking the Flexeril as needed that was prescribed by PCP, heat, massage, stretches.  Return for worsening symptoms.  X-ray imaging deferred today with shared decision making  Final Clinical Impressions(s) / UC Diagnoses   Final diagnoses:  Strain of lumbar region, initial encounter     Discharge Instructions      Continue the over-the-counter remedies,  heat, massage, gentle stretches.  We have given you a steroid shot today to help with pain and inflammation.  Follow-up for worsening symptoms.  ED Prescriptions   None    PDMP not reviewed this encounter.   Corbin Dess, [place] 12/28/23 1211

## 2023-12-28 NOTE — Discharge Instructions (Signed)
 Continue the over-the-counter remedies, heat, massage, gentle stretches.  We have given you a steroid shot today to help with pain and inflammation.  Follow-up for worsening symptoms.

## 2023-12-31 ENCOUNTER — Ambulatory Visit (HOSPITAL_COMMUNITY)
Admission: RE | Admit: 2023-12-31 | Discharge: 2023-12-31 | Disposition: A | Discharge status: Home / Self Care | Source: Ambulatory Visit | Attending: Family Medicine | Admitting: Family Medicine

## 2023-12-31 ENCOUNTER — Other Ambulatory Visit (HOSPITAL_COMMUNITY): Payer: Self-pay | Admitting: Family Medicine

## 2023-12-31 DIAGNOSIS — M419 Scoliosis, unspecified: Secondary | ICD-10-CM | POA: Diagnosis not present

## 2023-12-31 DIAGNOSIS — R52 Pain, unspecified: Secondary | ICD-10-CM

## 2023-12-31 DIAGNOSIS — M47814 Spondylosis without myelopathy or radiculopathy, thoracic region: Secondary | ICD-10-CM | POA: Diagnosis not present

## 2023-12-31 DIAGNOSIS — M549 Dorsalgia, unspecified: Secondary | ICD-10-CM | POA: Diagnosis not present

## 2023-12-31 DIAGNOSIS — M47816 Spondylosis without myelopathy or radiculopathy, lumbar region: Secondary | ICD-10-CM | POA: Diagnosis not present

## 2024-01-09 DIAGNOSIS — L720 Epidermal cyst: Secondary | ICD-10-CM | POA: Diagnosis not present

## 2024-01-09 DIAGNOSIS — C44519 Basal cell carcinoma of skin of other part of trunk: Secondary | ICD-10-CM | POA: Diagnosis not present

## 2024-01-10 ENCOUNTER — Encounter: Payer: Self-pay | Admitting: Orthopedic Surgery

## 2024-01-10 ENCOUNTER — Ambulatory Visit: Payer: Worker's Compensation | Admitting: Orthopedic Surgery

## 2024-01-10 VITALS — BP 116/78 | HR 99 | Ht 66.0 in | Wt 145.0 lb

## 2024-01-10 DIAGNOSIS — S22080A Wedge compression fracture of T11-T12 vertebra, initial encounter for closed fracture: Secondary | ICD-10-CM | POA: Diagnosis not present

## 2024-01-10 NOTE — Patient Instructions (Signed)
 Note for work-out of work until the next visit

## 2024-01-10 NOTE — Progress Notes (Signed)
 New Patient Visit  Assessment: Courtney Bradley is a 73 y.o. female with the following: 1. 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  Estimated at approximately 20% of the height.  We reviewed options in clinic today, including continue with nonoperative management.  Medications as needed.  Activities as tolerated.  No lifting.  Out of work until she is fully healed.  Other option discussed was evaluation by a spine surgeon, with consideration for kyphoplasty.  She appears to be tolerating her pain.  She states her pain is improving.  She would like to proceed with nonoperative management.  If she has any issues over the next few weeks, she can still consider a referral to a spine specialist.  She states understanding.  This was discussed with the Worker's Compensation case Production designer, theatre/television/film in clinic today.  Note for work has been provided.  I would like to see her back in 3 weeks.  If she is doing well at that time, we can initiate physical therapy.  Follow-up: Return in about 3 weeks (around 01/31/2024).  Subjective:  Chief Complaint  Patient presents with   Back Injury    Caught a pt who was falling (20% compression deformity of T12)    History of Present Illness: Courtney Bradley is a 73 y.o. female who presents for evaluation of back pain.  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  She was helping a patient, who was falling.  She tried to prevent the patient from falling, and felt a pulling sensation in her back.  Initially, she thought it was a pulled muscle.  The pain progressively worsened.  Ultimately, she sought treatment.  She was evaluated at an urgent care center.  Radiographs demonstrated a T12 compression fracture.  She still has difficulty ambulating, due to the pain.  However, her pain is slowly improving.  She denies  numbness and tingling.  She has excellent lower body strength.  She feels well overall.  She has not been able to return to work.  She does not feel as though she would be able to return to work.   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   Elevated cholesterol 11/28/2016   GERD (gastroesophageal reflux disease)    History of kidney stones    Hormone replacement therapy (HRT) 09/19/2014   Lymphadenopathy    Thoracic, followed by Bethanie Brooking   Obesity    Osteopenia    Pneumonia    PONV (postoperative nausea and vomiting)    Sarcoidosis     Past Surgical History:  Procedure Laterality Date   BIOPSY  04/09/2021   Procedure: BIOPSY;  Surgeon: Vinetta Greening, DO;  [facility];  Service: Endoscopy;;   BREAST CYST ASPIRATION     CHOLECYSTECTOMY     COLONOSCOPY N/A 11/17/2016   Procedure: COLONOSCOPY;  Surgeon: Ruby Corporal, MD;  [facility];  Service: Endoscopy;  Laterality: N/A;  830   COLONOSCOPY WITH PROPOFOL  N/A 04/09/2021   Procedure: COLONOSCOPY WITH PROPOFOL ;  Surgeon: Vinetta Greening, DO;  [facility];  Service: Endoscopy;  Laterality:  N/A;  8:30am   CORRECTION HAMMER TOE Bilateral    2nd   EYE SURGERY     GALLBLADDER SURGERY     LAPAROSCOPIC GASTRIC SLEEVE RESECTION WITH HIATAL HERNIA REPAIR N/A 03/14/2017   Procedure: LAPAROSCOPIC GASTRIC SLEEVE RESECTION WITH HIATAL HERNIA REPAIR;  Surgeon: Aldean Hummingbird, MD;  [facility];  Service: General;  Laterality: N/A;   LITHOTRIPSY     neck lift     POLYPECTOMY  04/09/2021   Procedure: POLYPECTOMY INTESTINAL;  Surgeon: Vinetta Greening, DO;  [facility];  Service: Endoscopy;;   UPPER GI ENDOSCOPY  03/14/2017   Procedure: UPPER GI ENDOSCOPY;  Surgeon: Aldean Hummingbird, MD;  [facility];  Service: General;;    Family History  Problem Relation Age of Onset   Heart disease Paternal Grandmother    Suicidality Maternal Grandfather    Alzheimer's disease Father    Cancer Father        prostate   Heart disease Mother    Diabetes Mother    Coronary artery disease Mother    Cancer Mother        Breast and thyroid  cancer   Hypercholesterolemia Brother    Other Brother        brain tumor   Obesity Daughter    Diabetes Daughter    Hypertension Daughter    Irritable bowel syndrome Daughter    Cancer Maternal Aunt        breast   Cancer Maternal Aunt        breast   Cancer Paternal Aunt        breast   Diabetes Half-Sister    Colon cancer Neg Hx    Celiac disease Neg Hx    Crohn's disease Neg Hx    Social History   Tobacco Use   Smoking status: Never   Smokeless tobacco: Never  Vaping Use   Vaping status: Never Used  Substance Use Topics   Alcohol use: Yes    Alcohol/week: 0.0 standard drinks of alcohol    Comment: very seldom   Drug use: No    Allergies  Allergen Reactions   Penicillins Swelling    Has patient had a PCN reaction causing immediate rash, facial/tongue/throat swelling, SOB or lightheadedness with hypotension: Yes Has patient had a PCN reaction causing severe rash involving mucus membranes or skin necrosis: No Has patient had a PCN reaction that required hospitalization: No Has patient had a PCN reaction occurring within the last 10 years: Yes If all of the above answers are "NO", then may proceed with Cephalosporin use.     Current Meds  Medication Sig   estradiol -levonorgestrel  (CLIMARA  PRO) 0.045-0.015 MG/DAY PLACE ONE PATCH ONTO THE SKIN ONCE A WEEK.   Multiple Vitamin (MULTIVITAMIN) tablet Take 1 tablet by mouth daily.   omeprazole (PRILOSEC) 20 MG capsule Take 20 mg by mouth daily.   OMEPRAZOLE PO Take by mouth.   rosuvastatin (CRESTOR) 20 MG tablet Take 20 mg by mouth at bedtime.   sertraline   (ZOLOFT ) 50 MG tablet TAKE ONE TABLET (50MG  TOTAL) BY MOUTH ATBEDTIME   tirzepatide (MOUNJARO) 7.5 MG/0.5ML Pen Inject 7.5 mg into the skin every 14 (fourteen) days.   TURMERIC PO Take by mouth daily.    Objective: BP 116/78   Pulse 99   Ht 5\' 6"  (1.676 m)   Wt 145 lb (65.8 kg)   BMI 23.40 kg/m   Physical Exam:  General: Alert and oriented. and No acute distress. Gait: Normal  gait.  Evaluation of back demonstrates no deformity.  No swelling.  No redness.  Tenderness palpation in the mid back area.  Negative straight leg raise bilaterally.  She has excellent lower body strength bilaterally.  Sensation intact throughout bilateral lower extremities.  IMAGING: I personally reviewed images previously obtained in clinic   X-rays of the lower back available in clinic today.  There is a mild compression deformity at T12.  Approximately 20% of the height has been compressed.   New Medications:  No orders of the defined types were placed in this encounter.     Tonita Frater, MD  01/10/2024 11:27 AM

## 2024-01-31 ENCOUNTER — Ambulatory Visit: Admitting: Orthopedic Surgery

## 2024-01-31 ENCOUNTER — Encounter: Payer: Self-pay | Admitting: Orthopedic Surgery

## 2024-01-31 ENCOUNTER — Other Ambulatory Visit (INDEPENDENT_AMBULATORY_CARE_PROVIDER_SITE_OTHER): Payer: Self-pay

## 2024-01-31 DIAGNOSIS — S22080D Wedge compression fracture of T11-T12 vertebra, subsequent encounter for fracture with routine healing: Secondary | ICD-10-CM | POA: Diagnosis not present

## 2024-01-31 DIAGNOSIS — S22080A Wedge compression fracture of T11-T12 vertebra, initial encounter for closed fracture: Secondary | ICD-10-CM

## 2024-01-31 NOTE — Progress Notes (Signed)
 Return patient Visit  Assessment: Courtney Bradley is a 73 y.o. female with the following: 1. Compression fracture of T12 vertebra, subsequent encounter [facility])  Plan: Torrance Freestone feels better overall.  Her pain is improving.  Her function is getting better.  Radiographs remain stable.  I have advised that her to remain out of work until she is fully healed.  Note has been provided for work.  Plan to initiate a referral for physical therapy.  She can initiate within the next couple weeks.  Medications as needed.  Plan to follow-up in approximately 1 month.  Follow-up: Return in about 4 weeks (around 02/28/2024).  Subjective:  Chief Complaint  Patient presents with   Fracture    Compression Fx T12 DOI 12/20/23    History of Present Illness: Courtney Bradley is a 73 y.o. female who returns for evaluation of back pain.  She injured her back at work, approximately 6 weeks ago.  She sustained a compression fracture of T12.  Her pain is getting better.  She tolerates more activity.  She has been walking, but unable to walk as long as she was previously.  She still has pain when she sits for long periods of time.  Her endurance has not fully returned.  She is taking Tylenol  occasionally.   Review of Systems: No fevers or chills No numbness or tingling No chest pain No shortness of breath No bowel or bladder dysfunction No GI distress No headaches      Objective: There were no vitals taken for this visit.  Physical Exam:  General: Alert and oriented. and No acute distress. Gait: Normal gait.  She is ambulating well.  She has some tenderness in the mid back area.  Good lower body strength.  Sensation is intact throughout bilateral lower extremities.  No additional injuries.   IMAGING: I personally ordered and reviewed the following images   X-rays of the lumbar spine were obtained in clinic today.  These are compared to prior x-rays.  There is been no change in overall  alignment.  No further displacement of the T12 compression fracture.  No anterolisthesis.  No bony lesions.  Impression: Stable T12 compression fracture, without additional injury  New Medications:  No orders of the defined types were placed in this encounter.     Tonita Frater, MD  01/31/2024 11:22 AM

## 2024-01-31 NOTE — Patient Instructions (Signed)
 Continue to walk for exercise.  Let pain be your guide.  Referral for physical therapy  Out of work until the next visit.

## 2024-02-01 ENCOUNTER — Ambulatory Visit (HOSPITAL_COMMUNITY): Payer: Worker's Compensation | Attending: Orthopedic Surgery

## 2024-02-01 ENCOUNTER — Other Ambulatory Visit: Payer: Self-pay

## 2024-02-01 ENCOUNTER — Encounter (HOSPITAL_COMMUNITY): Payer: Self-pay

## 2024-02-01 DIAGNOSIS — M5386 Other specified dorsopathies, lumbar region: Secondary | ICD-10-CM | POA: Diagnosis present

## 2024-02-01 DIAGNOSIS — Z7409 Other reduced mobility: Secondary | ICD-10-CM | POA: Diagnosis present

## 2024-02-01 DIAGNOSIS — S22080D Wedge compression fracture of T11-T12 vertebra, subsequent encounter for fracture with routine healing: Secondary | ICD-10-CM | POA: Diagnosis present

## 2024-02-01 NOTE — Therapy (Signed)
 OUTPATIENT PHYSICAL THERAPY THORACOLUMBAR EVALUATION   Patient Name: Courtney Bradley MRN: [id number] DOB:1950-11-28, 73 y.o., female Today's Date: 02/01/2024  END OF SESSION:  PT End of Session - 02/01/24 1350     Visit Number 1    Number of Visits 7    Date for PT Re-Evaluation 03/14/24    Authorization Type Worker's Comp    Progress Note Due on Visit 7    PT Start Time 1348    PT Stop Time 1425    PT Time Calculation (min) 37 min    Activity Tolerance Patient tolerated treatment well    Behavior During Therapy WFL for tasks assessed/performed          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   Elevated cholesterol 11/28/2016   GERD (gastroesophageal reflux disease)    History of kidney stones    Hormone replacement therapy (HRT) 09/19/2014   Lymphadenopathy    Thoracic, followed by Bethanie Brooking   Obesity    Osteopenia    Pneumonia    PONV (postoperative nausea and vomiting)    Sarcoidosis    Past Surgical History:  Procedure Laterality Date   BIOPSY  04/09/2021   Procedure: BIOPSY;  Surgeon: Vinetta Greening, DO;  [facility];  Service: Endoscopy;;   BREAST CYST ASPIRATION     CHOLECYSTECTOMY     COLONOSCOPY N/A 11/17/2016   Procedure: COLONOSCOPY;  Surgeon: Ruby Corporal, MD;  [facility];  Service: Endoscopy;  Laterality: N/A;  830   COLONOSCOPY WITH PROPOFOL  N/A 04/09/2021   Procedure: COLONOSCOPY WITH PROPOFOL ;  Surgeon: Vinetta Greening, DO;  [facility];  Service: Endoscopy;  Laterality: N/A;  8:30am   CORRECTION HAMMER TOE Bilateral    2nd   EYE SURGERY     GALLBLADDER SURGERY     LAPAROSCOPIC GASTRIC SLEEVE RESECTION WITH HIATAL HERNIA REPAIR N/A 03/14/2017   Procedure: LAPAROSCOPIC GASTRIC SLEEVE RESECTION WITH HIATAL HERNIA REPAIR;  Surgeon: Aldean Hummingbird, MD;  Location: [facility];  Service: General;  Laterality: N/A;   LITHOTRIPSY     neck lift     POLYPECTOMY  04/09/2021   Procedure:  POLYPECTOMY INTESTINAL;  Surgeon: Vinetta Greening, DO;  [facility];  Service: Endoscopy;;   UPPER GI ENDOSCOPY  03/14/2017   Procedure: UPPER GI ENDOSCOPY;  Surgeon: Aldean Hummingbird, MD;  Location: [facility];  Service: General;;   Patient Active Problem List   Diagnosis Date Noted   Elevated coronary artery calcium score 10/11/2023   Family history of premature CAD 10/11/2023   Encounter for screening fecal occult blood testing 04/18/2023   Encounter for gynecological examination with Papanicolaou smear of cervix 02/14/2022   Postmenopause 02/14/2022   Diarrhea 03/15/2021   Change in bowel function 03/15/2021   Sarcoidosis 10/09/2020   Erythrocytosis 01/22/2020   History of bariatric surgery 12/27/2019   Encounter for well woman exam with routine gynecological exam 12/27/2019   Gastroesophageal reflux disease 12/22/2017   Screening for colorectal cancer 12/22/2017   Encounter for counseling 12/22/2017   Morbid obesity (HCC) 03/14/2017   Osteopenia 03/14/2017   Dyslipidemia 03/14/2017   GERD (gastroesophageal reflux disease) 03/14/2017   Elevated cholesterol 11/28/2016   Special screening for malignant neoplasms, colon 08/11/2016   Well woman exam with routine gynecological exam 07/22/2016   Weight gain 07/22/2016   Hormone replacement therapy (HRT) 09/19/2014   Fatigue fracture of vertebra of sacral and sacrococcygeal region  01/26/2011   Disease of nail 06/12/2008   Closed fracture of sacrum and coccyx (HCC) 06/18/2007    PCP: Artemisa Bile, MD  REFERRING PROVIDER: Tonita Frater, MD  REFERRING DIAG: t12 compression fracture   Rationale for Evaluation and Treatment: Rehabilitation  THERAPY DIAG:  Compression fracture of T12 vertebra with routine healing, subsequent encounter  Decreased ROM of lumbar spine  Impaired functional mobility, balance, gait, and endurance  ONSET DATE: April of 2025  SUBJECTIVE:                                                                                                                                                                                            SUBJECTIVE STATEMENT: 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 Felt a pull/pop, went back to work and recently been evaluated by Orthopedics, with T 12 compression fracture. Painful during initial walking. Pt walked 2 miles this morning. Pt is limited in daily chores and heavy activities at home, as avoided lifting due to the pain. Pt reports sleeping mainly on left side.   PERTINENT HISTORY:  Osteopenic   PAIN:  Are you having pain? Yes: NPRS scale: 0/10; worst 6/10 during transitions.  Pain location: middle of back Pain description: moderate, achy Aggravating factors: initial transitions from sitting  Relieving factors: Laying down on ice  PRECAUTIONS: None  RED FLAGS: None   WEIGHT BEARING RESTRICTIONS: No  FALLS:  Has patient fallen in last 6 months? No   PATIENT GOALS: I want to get back to doing those daily activities.   OBJECTIVE:  Note: Objective measures were completed at Evaluation unless otherwise noted.  DIAGNOSTIC FINDINGS:    PATIENT SURVEYS:  Modified Oswestry 14/50 = 28%   COGNITION: Overall cognitive status: Within functional limits for tasks assessed     SENSATION: WFL  POSTURE: decreased lumbar lordosis  PALPATION: Non tender to palpate along lumbar and thoracic paraspinals.  LUMBAR ROM:   AROM eval  Flexion 90% and painfree  Extension 50% and painful  Right lateral flexion 85% and painful  Left lateral flexion 85% and painful  Right rotation 90% and painfree  Left rotation 90% and painfree   (Blank rows = not tested)  LOWER EXTREMITY ROM:     Active  Right eval Left eval  Hip flexion    Hip extension    Hip abduction    Hip adduction    Hip internal rotation    Hip external rotation    Knee flexion    Knee extension    Ankle dorsiflexion    Ankle  plantarflexion  Ankle inversion    Ankle eversion     (Blank rows = not tested)  LOWER EXTREMITY MMT:    MMT Right eval Left eval  Hip flexion    Hip extension 3+ 3+  Hip abduction 3+ 3+  Hip adduction    Hip internal rotation    Hip external rotation    Knee flexion 3+ 3+  Knee extension    Ankle dorsiflexion 4- 4-  Ankle plantarflexion    Ankle inversion    Ankle eversion     (Blank rows = not tested)  FUNCTIONAL TESTS:  30 Second Chair Stand Test: 13  Norms:   Age 33-64 80-69 70-74 75-79 80-84 85-89 90-94  Women 15 15 14 13 12 11 9   Men 17 16 15 14 13 11 9      L SLS: 2.66 seconds  R SLS: 9.00 seconds  Norms: 18-39  F: 43.5 seconds  M: 43.2 seconds 40-49  F: 40.4 seconds  M: 40.1 seconds 50-59  F: 36 seconds  M: 38.1 seconds 60-69  F: 25.1 seconds  M: 28.7 seconds 70-79  F: 11.3 seconds  M: 18.3 seconds  GAIT: Distance walked: 22ft Assistive device utilized: None Level of assistance: Complete Independence Comments: Forward trunk flexion  TREATMENT DATE:  02/01/2024 PT evaluation Prone on elbow x 2' Supine bridge x 12 LTR x 30 Clamshells with RTB x 12                                                                                                                                  PATIENT EDUCATION:  Education details: PT Evaluation, findings, prognosis, frequency, attendance policy, and HEP below. Person educated: Patient Education method: Medical illustrator Education comprehension: verbalized understanding  HOME EXERCISE PROGRAM: Access Code: [email or web address] URL: [email or web address] Date: 02/01/2024 Prepared by: Irene Mannheim  Exercises - Static Prone on Elbows  - 1 x daily - 7 x weekly - 3 sets - 10 reps - Supine Lower Trunk Rotation  - 1 x daily - 7 x weekly - 3 sets - 10 reps - Supine Bridge with Resistance Band  - 1 x daily - 7 x weekly - 3 sets - 10 reps - Clamshell with Resistance  - 1 x daily - 7 x  weekly - 3 sets - 10 reps  ASSESSMENT:  CLINICAL IMPRESSION: Patient is a 73 y.o. female who was seen today for physical therapy evaluation and treatment for t12 compression fracture. Pt with T12 compression fracture, Pt with limited bending, lifting, and performing ADLs around the home due to pain and ROM limitations noted in objective. Pt also noted with BLE hip weakness and balance deficits that are of concerns for increasing falls risk. Pt will benefit from skilled Physical Therapy services to address deficits/limitations in order to improve functional and QOL.    OBJECTIVE IMPAIRMENTS: decreased balance, decreased mobility, difficulty walking, decreased ROM, decreased strength, hypomobility, postural dysfunction, and pain.  ACTIVITY LIMITATIONS: carrying, lifting, bending, sitting, standing, stairs, transfers, and locomotion level  PARTICIPATION LIMITATIONS: community activity, occupation, and yard work  PERSONAL FACTORS: Age are also affecting patient's functional outcome.   REHAB POTENTIAL: Excellent  CLINICAL DECISION MAKING: Stable/uncomplicated  EVALUATION COMPLEXITY: Low   GOALS: Goals reviewed with patient? No  SHORT TERM GOALS: Target date: 02/22/24  Pt will be independent with HEP in order to demonstrate participation in Physical Therapy POC.  Baseline: Goal status: INITIAL  2.  Pt will report 3/10 pain with mobility in order to demonstrate improved pain with ADLs.  Baseline:  Goal status: INITIAL  LONG TERM GOALS: Target date: 03/14/24  Pt will improve Lumbar ROM by 10% in limited areas in order to demonstrate improved functional strength to return to desired activities.  Baseline: see objective.  Goal status: INITIAL  2.  Pt will improve 30 Second Stand Chair Test by at least 1 rep in order to demonstrate improved functional ambulatory capacity in community setting.  Baseline: see objective.  Goal status: INITIAL  3.  Pt will improve Modified Oswestry score  by 15% in order to demonstrate improved pain with functional goals and outcomes. Baseline: see objective.  Goal status: INITIAL  4.  Pt will report 0/10 pain with mobility in order to demonstrate reduced pain with ADLs lasting greater than 30 minutes.  Baseline: see objective.  Goal status: INITIAL  5. Pt will improve SL balance to at least 11 seconds bilaterally demonstrate increased safety with functional mobility.   PLAN:  PT FREQUENCY: 1x/week  PT DURATION: 6 weeks  PLANNED INTERVENTIONS: 97164- PT Re-evaluation, 97750- Physical Performance Testing, 97110-Therapeutic exercises, 97530- Therapeutic activity, [id number]- Neuromuscular re-education, 97535- Self Care, 16109- Manual therapy, [id number]- Gait training, [phone]- Electrical stimulation (unattended), [phone]- Electrical stimulation (manual), 20560 (1-2 muscles), 20561 (3+ muscles)- Dry Needling, Patient/Family education, Balance training, Cryotherapy, and Moist heat.  PLAN FOR NEXT SESSION: single leg balance activities, decompression and lumbar ROM, B hip strengthening.   Gatha Kaska PT, DPT [facility] 336 [phone] office   Gatha Kaska, PT 02/01/2024, 2:28 PM

## 2024-02-09 ENCOUNTER — Encounter (HOSPITAL_COMMUNITY)

## 2024-02-21 ENCOUNTER — Encounter (HOSPITAL_COMMUNITY)

## 2024-02-26 ENCOUNTER — Ambulatory Visit (HOSPITAL_COMMUNITY): Payer: Worker's Compensation | Attending: Orthopedic Surgery

## 2024-02-26 ENCOUNTER — Encounter (HOSPITAL_COMMUNITY)

## 2024-02-26 DIAGNOSIS — Z7409 Other reduced mobility: Secondary | ICD-10-CM | POA: Diagnosis present

## 2024-02-26 DIAGNOSIS — S22080D Wedge compression fracture of T11-T12 vertebra, subsequent encounter for fracture with routine healing: Secondary | ICD-10-CM | POA: Diagnosis present

## 2024-02-26 DIAGNOSIS — M5386 Other specified dorsopathies, lumbar region: Secondary | ICD-10-CM | POA: Insufficient documentation

## 2024-02-26 NOTE — Therapy (Addendum)
 OUTPATIENT PHYSICAL THERAPY THORACOLUMBAR EVALUATION   Patient Name: Courtney Bradley MRN: [id number] DOB:Jan 25, 1951, 73 y.o., female Today's Date: 02/26/2024  END OF SESSION:  PT End of Session - 02/26/24 1259     Visit Number 2    Number of Visits 7    Date for PT Re-Evaluation 03/14/24    Authorization Type Worker's Comp    Progress Note Due on Visit 7    PT Start Time 1300    PT Stop Time 1340    PT Time Calculation (min) 40 min    Activity Tolerance Patient tolerated treatment well    Behavior During Therapy WFL for tasks assessed/performed          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   Elevated cholesterol 11/28/2016   GERD (gastroesophageal reflux disease)    History of kidney stones    Hormone replacement therapy (HRT) 09/19/2014   Lymphadenopathy    Thoracic, followed by [facility]   Obesity    Osteopenia    Pneumonia    PONV (postoperative nausea and vomiting)    Sarcoidosis    Past Surgical History:  Procedure Laterality Date   BIOPSY  04/09/2021   Procedure: BIOPSY;  Surgeon: Cindie Carlin POUR, DO;  [facility];  Service: Endoscopy;;   BREAST CYST ASPIRATION     CHOLECYSTECTOMY     COLONOSCOPY N/A 11/17/2016   Procedure: COLONOSCOPY;  Surgeon: Claudis RAYMOND Rivet, MD;  [facility];  Service: Endoscopy;  Laterality: N/A;  830   COLONOSCOPY WITH PROPOFOL  N/A 04/09/2021   Procedure: COLONOSCOPY WITH PROPOFOL ;  Surgeon: Cindie Carlin POUR, DO;  [facility];  Service: Endoscopy;  Laterality: N/A;  8:30am   CORRECTION HAMMER TOE Bilateral    2nd   EYE SURGERY     GALLBLADDER SURGERY     LAPAROSCOPIC GASTRIC SLEEVE RESECTION WITH HIATAL HERNIA REPAIR N/A 03/14/2017   Procedure: LAPAROSCOPIC GASTRIC SLEEVE RESECTION WITH HIATAL HERNIA REPAIR;  Surgeon: Tanda Locus, MD;  Location: [facility];  Service: General;  Laterality: N/A;   LITHOTRIPSY     neck lift     POLYPECTOMY  04/09/2021   Procedure:  POLYPECTOMY INTESTINAL;  Surgeon: Cindie Carlin POUR, DO;  [facility];  Service: Endoscopy;;   UPPER GI ENDOSCOPY  03/14/2017   Procedure: UPPER GI ENDOSCOPY;  Surgeon: Tanda Locus, MD;  [facility];  Service: General;;   Patient Active Problem List   Diagnosis Date Noted   Elevated coronary artery calcium score 10/11/2023   Family history of premature CAD 10/11/2023   Encounter for screening fecal occult blood testing 04/18/2023   Encounter for gynecological examination with Papanicolaou smear of cervix 02/14/2022   Postmenopause 02/14/2022   Diarrhea 03/15/2021   Change in bowel function 03/15/2021   Sarcoidosis 10/09/2020   Erythrocytosis 01/22/2020   History of bariatric surgery 12/27/2019   Encounter for well woman exam with routine gynecological exam 12/27/2019   Gastroesophageal reflux disease 12/22/2017   Screening for colorectal cancer 12/22/2017   Encounter for counseling 12/22/2017   Morbid obesity (HCC) 03/14/2017   Osteopenia 03/14/2017   Dyslipidemia 03/14/2017   GERD (gastroesophageal reflux disease) 03/14/2017   Elevated cholesterol 11/28/2016   Special screening for malignant neoplasms, colon 08/11/2016   Well woman exam with routine gynecological exam 07/22/2016   Weight gain 07/22/2016   Hormone replacement therapy (HRT) 09/19/2014   Fatigue fracture of vertebra of sacral and sacrococcygeal region  01/26/2011   Disease of nail 06/12/2008   Closed fracture of sacrum and coccyx (HCC) 06/18/2007    PCP: Sheryle Carwin, MD  REFERRING PROVIDER: Onesimo Oneil LABOR, MD  REFERRING DIAG: t12 compression fracture   Rationale for Evaluation and Treatment: Rehabilitation  THERAPY DIAG:  Compression fracture of T12 vertebra with routine healing, subsequent encounter  Decreased ROM of lumbar spine  Impaired functional mobility, balance, gait, and endurance  ONSET DATE: April of 2025  SUBJECTIVE:                                                                                                                                                                                            SUBJECTIVE STATEMENT: She comes in today after walking.  Forgot her pillow at church yesterday and that made her back hurt some.  X-ray's look like it's healing; worried about having to lift at work and standing on cement flooring.  Has not attempted to lift anything of substance yet.    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 Felt a pull/pop, went back to work and recently been evaluated by Orthopedics, with T 12 compression fracture. Painful during initial walking. Pt walked 2 miles this morning. Pt is limited in daily chores and heavy activities at home, as avoided lifting due to the pain. Pt reports sleeping mainly on left side.   PERTINENT HISTORY:  Osteopenic   PAIN:  Are you having pain? Yes: NPRS scale: 0/10; worst 6/10 during transitions.  Pain location: middle of back Pain description: moderate, achy Aggravating factors: initial transitions from sitting  Relieving factors: Laying down on ice  PRECAUTIONS: None  RED FLAGS: None   WEIGHT BEARING RESTRICTIONS: No  FALLS:  Has patient fallen in last 6 months? No   PATIENT GOALS: I want to get back to doing those daily activities.   OBJECTIVE:  Note: Objective measures were completed at Evaluation unless otherwise noted.  DIAGNOSTIC FINDINGS:    PATIENT SURVEYS:  Modified Oswestry 14/50 = 28%   COGNITION: Overall cognitive status: Within functional limits for tasks assessed     SENSATION: WFL  POSTURE: decreased lumbar lordosis  PALPATION: Non tender to palpate along lumbar and thoracic paraspinals.  LUMBAR ROM:   AROM eval  Flexion 90% and painfree  Extension 50% and painful  Right lateral flexion 85% and painful  Left lateral flexion 85% and painful  Right rotation 90% and painfree  Left rotation 90% and painfree    (Blank rows = not tested)  LOWER EXTREMITY ROM:     Active  Right eval Left  eval  Hip flexion    Hip extension    Hip abduction    Hip adduction    Hip internal rotation    Hip external rotation    Knee flexion    Knee extension    Ankle dorsiflexion    Ankle plantarflexion    Ankle inversion    Ankle eversion     (Blank rows = not tested)  LOWER EXTREMITY MMT:    MMT Right eval Left eval  Hip flexion    Hip extension 3+ 3+  Hip abduction 3+ 3+  Hip adduction    Hip internal rotation    Hip external rotation    Knee flexion 3+ 3+  Knee extension    Ankle dorsiflexion 4- 4-  Ankle plantarflexion    Ankle inversion    Ankle eversion     (Blank rows = not tested)  FUNCTIONAL TESTS:  30 Second Chair Stand Test: 13  Norms:   Age 29-64 65-69 70-74 75-79 80-84 85-89 90-94  Women 15 15 14 13 12 11 9   Men 17 16 15 14 13 11 9      L SLS: 2.66 seconds  R SLS: 9.00 seconds  Norms: 18-39  F: 43.5 seconds  M: 43.2 seconds 40-49  F: 40.4 seconds  M: 40.1 seconds 50-59  F: 36 seconds  M: 38.1 seconds 60-69  F: 25.1 seconds  M: 28.7 seconds 70-79  F: 11.3 seconds  M: 18.3 seconds  GAIT: Distance walked: 73ft Assistive device utilized: None Level of assistance: Complete Independence Comments: Forward trunk flexion  TREATMENT DATE:  02/26/24 Review of HEP and goals Supine: LTR x 10 Bridge 5 sec hold 2 x 10 Clam with green theraband 2 x 10 each side Decompression exercise  1-5  Updated HEP    02/01/2024 PT evaluation Prone on elbow x 2' Supine bridge x 12 LTR x 30 Clamshells with RTB x 12                                                                                                                                  PATIENT EDUCATION:  Education details: PT Evaluation, findings, prognosis, frequency, attendance policy, and HEP below. Person educated: Patient Education method: Explanation and Demonstration Education comprehension:  verbalized understanding  HOME EXERCISE PROGRAM: 02/26/24 decompression exercises 1-5 Access Code: [email or web address] URL: [email or web address] Date: 02/01/2024 Prepared by: Omega Bottcher  Exercises - Static Prone on Elbows  - 1 x daily - 7 x weekly - 3 sets - 10 reps - Supine Lower Trunk Rotation  - 1 x daily - 7 x weekly - 3 sets - 10 reps - Supine Bridge with Resistance Band  - 1 x daily - 7 x weekly - 3 sets - 10 reps - Clamshell with Resistance  - 1 x daily - 7 x weekly - 3 sets - 10 reps  ASSESSMENT:  CLINICAL IMPRESSION: Today's session started with a review of HEP  and goals. Patient verbalizes agreement with set rehab goals.   Progressed clam to green theraband.  Needs occasional cues for form with clam. Mild pain with initial bridge but decreased pain with abdominal bracing with bridge.   Added decompression exercises to treatment today and updated HEP.  Discussed with patient safe exercises to perform in the gym when she returns.  Patient will benefit from continued skilled therapy services to address deficits and promote return to optimal function.      Eval: Patient is a 73 y.o. female who was seen today for physical therapy evaluation and treatment for t12 compression fracture. Pt with T12 compression fracture, Pt with limited bending, lifting, and performing ADLs around the home due to pain and ROM limitations noted in objective. Pt also noted with BLE hip weakness and balance deficits that are of concerns for increasing falls risk. Pt will benefit from skilled Physical Therapy services to address deficits/limitations in order to improve functional and QOL.    OBJECTIVE IMPAIRMENTS: decreased balance, decreased mobility, difficulty walking, decreased ROM, decreased strength, hypomobility, postural dysfunction, and pain.   ACTIVITY LIMITATIONS: carrying, lifting, bending, sitting, standing, stairs, transfers, and locomotion level  PARTICIPATION LIMITATIONS: community  activity, occupation, and yard work  PERSONAL FACTORS: Age are also affecting patient's functional outcome.   REHAB POTENTIAL: Excellent  CLINICAL DECISION MAKING: Stable/uncomplicated  EVALUATION COMPLEXITY: Low   GOALS: Goals reviewed with patient? No  SHORT TERM GOALS: Target date: 02/22/24  Pt will be independent with HEP in order to demonstrate participation in Physical Therapy POC.  Baseline: Goal status: IN PROGRESS  2.  Pt will report 3/10 pain with mobility in order to demonstrate improved pain with ADLs.  Baseline:  Goal status: IN PROGRESS  LONG TERM GOALS: Target date: 03/14/24  Pt will improve Lumbar ROM by 10% in limited areas in order to demonstrate improved functional strength to return to desired activities.  Baseline: see objective.  Goal status: IN PROGRESS  2.  Pt will improve 30 Second Stand Chair Test by at least 1 rep in order to demonstrate improved functional ambulatory capacity in community setting.  Baseline: see objective.  Goal status: IN PROGRESS  3.  Pt will improve Modified Oswestry score by 15% in order to demonstrate improved pain with functional goals and outcomes. Baseline: see objective.  Goal status: IN PROGRESS  4.  Pt will report 0/10 pain with mobility in order to demonstrate reduced pain with ADLs lasting greater than 30 minutes.  Baseline: see objective.  Goal status: IN PROGRESS  5. Pt will improve SL balance to at least 11 seconds bilaterally demonstrate increased safety with functional mobility.   PLAN:  PT FREQUENCY: 1x/week  PT DURATION: 6 weeks  PLANNED INTERVENTIONS: 97164- PT Re-evaluation, 97750- Physical Performance Testing, 97110-Therapeutic exercises, 97530- Therapeutic activity, [id number]- Neuromuscular re-education, 97535- Self Care, 02859- Manual therapy, [id number]- Gait training, [phone]- Electrical stimulation (unattended), [phone]- Electrical stimulation (manual), 20560 (1-2 muscles), 20561 (3+ muscles)- Dry Needling,  Patient/Family education, Balance training, Cryotherapy, and Moist heat.  PLAN FOR NEXT SESSION: single leg balance activities, decompression and lumbar ROM, B hip strengthening. Begin functional strengthening as appropriate 12:59 PM, 02/26/24 Vaishnavi Dalby Small Jayleon Mcfarlane MPT [place] [facility] [place] [phone]

## 2024-03-05 ENCOUNTER — Ambulatory Visit: Admitting: Orthopedic Surgery

## 2024-03-05 DIAGNOSIS — C44519 Basal cell carcinoma of skin of other part of trunk: Secondary | ICD-10-CM | POA: Diagnosis not present

## 2024-03-05 DIAGNOSIS — D225 Melanocytic nevi of trunk: Secondary | ICD-10-CM | POA: Diagnosis not present

## 2024-03-05 DIAGNOSIS — D045 Carcinoma in situ of skin of trunk: Secondary | ICD-10-CM | POA: Diagnosis not present

## 2024-03-05 DIAGNOSIS — D485 Neoplasm of uncertain behavior of skin: Secondary | ICD-10-CM | POA: Diagnosis not present

## 2024-03-06 ENCOUNTER — Ambulatory Visit (HOSPITAL_COMMUNITY): Payer: Worker's Compensation

## 2024-03-06 ENCOUNTER — Encounter (HOSPITAL_COMMUNITY): Payer: Self-pay

## 2024-03-06 DIAGNOSIS — S22080D Wedge compression fracture of T11-T12 vertebra, subsequent encounter for fracture with routine healing: Secondary | ICD-10-CM | POA: Diagnosis not present

## 2024-03-06 DIAGNOSIS — Z7409 Other reduced mobility: Secondary | ICD-10-CM

## 2024-03-06 DIAGNOSIS — M5386 Other specified dorsopathies, lumbar region: Secondary | ICD-10-CM

## 2024-03-06 NOTE — Therapy (Signed)
 OUTPATIENT PHYSICAL THERAPY THORACOLUMBAR TREATMENT   Patient Name: Courtney Bradley MRN: [id number] DOB:[birth year], 73 y.o., female Today's Date: 03/06/2024  END OF SESSION:  PT End of Session - 03/06/24 1005     Visit Number 3    Number of Visits 7    Date for PT Re-Evaluation 03/14/24    Authorization Type Worker's Comp    Progress Note Due on Visit 7    PT Start Time 1305    PT Stop Time 1347    PT Time Calculation (min) 42 min    Activity Tolerance Patient tolerated treatment well    Behavior During Therapy WFL for tasks assessed/performed          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   Elevated cholesterol 11/28/2016   GERD (gastroesophageal reflux disease)    History of kidney stones    Hormone replacement therapy (HRT) 09/19/2014   Lymphadenopathy    Thoracic, followed by [facility]   Obesity    Osteopenia    Pneumonia    PONV (postoperative nausea and vomiting)    Sarcoidosis    Past Surgical History:  Procedure Laterality Date   BIOPSY  04/09/2021   Procedure: BIOPSY;  Surgeon: Cindie Carlin POUR, DO;  Location: [facility];  Service: Endoscopy;;   BREAST CYST ASPIRATION     CHOLECYSTECTOMY     COLONOSCOPY N/A 11/17/2016   Procedure: COLONOSCOPY;  Surgeon: Claudis RAYMOND Rivet, MD;  Location: [facility];  Service: Endoscopy;  Laterality: N/A;  830   COLONOSCOPY WITH PROPOFOL  N/A 04/09/2021   Procedure: COLONOSCOPY WITH PROPOFOL ;  Surgeon: Cindie Carlin POUR, DO;  Location: [facility];  Service: Endoscopy;  Laterality: N/A;  8:30am   CORRECTION HAMMER TOE Bilateral    2nd   EYE SURGERY     GALLBLADDER SURGERY     LAPAROSCOPIC GASTRIC SLEEVE RESECTION WITH HIATAL HERNIA REPAIR N/A 03/14/2017   Procedure: LAPAROSCOPIC GASTRIC SLEEVE RESECTION WITH HIATAL HERNIA REPAIR;  Surgeon: Tanda Locus, MD;  Location: [facility];  Service: General;  Laterality: N/A;   LITHOTRIPSY     neck lift     POLYPECTOMY  04/09/2021   Procedure:  POLYPECTOMY INTESTINAL;  Surgeon: Cindie Carlin POUR, DO;  Location: [facility];  Service: Endoscopy;;   UPPER GI ENDOSCOPY  03/14/2017   Procedure: UPPER GI ENDOSCOPY;  Surgeon: Tanda Locus, MD;  Location: [facility];  Service: General;;   Patient Active Problem List   Diagnosis Date Noted   Elevated coronary artery calcium score 10/11/2023   Family history of premature CAD 10/11/2023   Encounter for screening fecal occult blood testing 04/18/2023   Encounter for gynecological examination with Papanicolaou smear of cervix 02/14/2022   Postmenopause 02/14/2022   Diarrhea 03/15/2021   Change in bowel function 03/15/2021   Sarcoidosis 10/09/2020   Erythrocytosis 01/22/2020   History of bariatric surgery 12/27/2019   Encounter for well woman exam with routine gynecological exam 12/27/2019   Gastroesophageal reflux disease 12/22/2017   Screening for colorectal cancer 12/22/2017   Encounter for counseling 12/22/2017   Morbid obesity (HCC) 03/14/2017   Osteopenia 03/14/2017   Dyslipidemia 03/14/2017   GERD (gastroesophageal reflux disease) 03/14/2017   Elevated cholesterol 11/28/2016   Special screening for malignant neoplasms, colon 08/11/2016   Well woman exam with routine gynecological exam 07/22/2016   Weight gain 07/22/2016   Hormone replacement therapy (HRT) 09/19/2014   Fatigue fracture of vertebra of sacral and sacrococcygeal region  01/26/2011   Disease of nail 06/12/2008   Closed fracture of sacrum and coccyx (HCC) 06/18/2007    PCP: Sheryle Carwin, MD  REFERRING PROVIDER: Onesimo Oneil LABOR, MD Next apt 03/12/24  REFERRING DIAG: t12 compression fracture   Rationale for Evaluation and Treatment: Rehabilitation  THERAPY DIAG:  Compression fracture of T12 vertebra with routine healing, subsequent encounter  Decreased ROM of lumbar spine  Impaired functional mobility, balance, gait, and endurance  ONSET DATE: April of 2025  SUBJECTIVE:                                                                                                                                                                                            SUBJECTIVE STATEMENT: Current pain scale 3/10, thoracic pain achey pain.  Has resumed walking up to 40 minutes.  Exercises are going well at home.    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 Felt a pull/pop, went back to work and recently been evaluated by Orthopedics, with T 12 compression fracture. Painful during initial walking. Pt walked 2 miles this morning. Pt is limited in daily chores and heavy activities at home, as avoided lifting due to the pain. Pt reports sleeping mainly on left side.   PERTINENT HISTORY:  Osteopenic   PAIN:  Are you having pain? Yes: NPRS scale: 0/10; worst 6/10 during transitions.  Pain location: middle of back Pain description: moderate, achy Aggravating factors: initial transitions from sitting  Relieving factors: Laying down on ice  PRECAUTIONS: None  RED FLAGS: None   WEIGHT BEARING RESTRICTIONS: No  FALLS:  Has patient fallen in last 6 months? No   PATIENT GOALS: I want to get back to doing those daily activities.   OBJECTIVE:  Note: Objective measures were completed at Evaluation unless otherwise noted.  DIAGNOSTIC FINDINGS:    PATIENT SURVEYS:  Modified Oswestry 14/50 = 28%   COGNITION: Overall cognitive status: Within functional limits for tasks assessed     SENSATION: WFL  POSTURE: decreased lumbar lordosis  PALPATION: Non tender to palpate along lumbar and thoracic paraspinals.  LUMBAR ROM:   AROM eval  Flexion 90% and painfree  Extension 50% and painful  Right lateral flexion 85% and painful  Left lateral flexion 85% and painful  Right rotation 90% and painfree  Left rotation 90% and painfree   (Blank rows = not tested)  LOWER EXTREMITY ROM:     Active  Right eval Left eval  Hip flexion    Hip extension     Hip abduction    Hip adduction  Hip internal rotation    Hip external rotation    Knee flexion    Knee extension    Ankle dorsiflexion    Ankle plantarflexion    Ankle inversion    Ankle eversion     (Blank rows = not tested)  LOWER EXTREMITY MMT:    MMT Right eval Left eval  Hip flexion    Hip extension 3+ 3+  Hip abduction 3+ 3+  Hip adduction    Hip internal rotation    Hip external rotation    Knee flexion 3+ 3+  Knee extension    Ankle dorsiflexion 4- 4-  Ankle plantarflexion    Ankle inversion    Ankle eversion     (Blank rows = not tested)  FUNCTIONAL TESTS:  30 Second Chair Stand Test: 13  Norms:   Age 33-64 40-69 70-74 75-79 80-84 85-89 90-94  Women 15 15 14 13 12 11 9   Men 17 16 15 14 13 11 9      L SLS: 2.66 seconds  R SLS: 9.00 seconds  Norms: 18-39  F: 43.5 seconds  M: 43.2 seconds 40-49  F: 40.4 seconds  M: 40.1 seconds 50-59  F: 36 seconds  M: 38.1 seconds 60-69  F: 25.1 seconds  M: 28.7 seconds 70-79  F: 11.3 seconds  M: 18.3 seconds  GAIT: Distance walked: 39ft Assistive device utilized: None Level of assistance: Complete Independence Comments: Forward trunk flexion  TREATMENT DATE:  03/06/24: Standing:   RTB rows 20x 3 Shoulder extension 20x 3 3D hip excursion (squat, lateral weight shifting, rotation, no extension) SLS Lt 5, Rt 7 Vector stance 3x 5  Supine: Decompression with RTB 10x 5  02/26/24 Review of HEP and goals Supine: LTR x 10 Bridge 5 sec hold 2 x 10 Clam with green theraband 2 x 10 each side Decompression exercise  1-5  Updated HEP    02/01/2024 PT evaluation Prone on elbow x 2' Supine bridge x 12 LTR x 30 Clamshells with RTB x 12                                                                                                                                  PATIENT EDUCATION:  Education details: PT Evaluation, findings, prognosis, frequency, attendance policy, and HEP  below. Person educated: Patient Education method: Explanation and Demonstration Education comprehension: verbalized understanding  HOME EXERCISE PROGRAM: 02/26/24 decompression exercises 1-5 Access Code: [email or web address] URL: [email or web address] Date: 02/01/2024 Prepared by: Omega Bottcher  Exercises - Static Prone on Elbows  - 1 x daily - 7 x weekly - 3 sets - 10 reps - Supine Lower Trunk Rotation  - 1 x daily - 7 x weekly - 3 sets - 10 reps - Supine Bridge with Resistance Band  - 1 x daily - 7 x weekly - 3 sets - 10 reps - Clamshell with Resistance  - 1 x daily - 7 x weekly - 3  sets - 10 reps  ASSESSMENT:  CLINICAL IMPRESSION: Educated importance of posture for pain control.  Added resistance with postural strengthening with good tolerance, did require initial cueing for form and to increased hold time for maximal strengthening.  Added balance activities this session which was challenging for pt with SLS, encouraged to begin at home in safe location with HHA available for safety.  No reports of pain through session.  Eval: Patient is a 73 y.o. female who was seen today for physical therapy evaluation and treatment for t12 compression fracture. Pt with T12 compression fracture, Pt with limited bending, lifting, and performing ADLs around the home due to pain and ROM limitations noted in objective. Pt also noted with BLE hip weakness and balance deficits that are of concerns for increasing falls risk. Pt will benefit from skilled Physical Therapy services to address deficits/limitations in order to improve functional and QOL.    OBJECTIVE IMPAIRMENTS: decreased balance, decreased mobility, difficulty walking, decreased ROM, decreased strength, hypomobility, postural dysfunction, and pain.   ACTIVITY LIMITATIONS: carrying, lifting, bending, sitting, standing, stairs, transfers, and locomotion level  PARTICIPATION LIMITATIONS: community activity, occupation, and yard work  PERSONAL  FACTORS: Age are also affecting patient's functional outcome.   REHAB POTENTIAL: Excellent  CLINICAL DECISION MAKING: Stable/uncomplicated  EVALUATION COMPLEXITY: Low   GOALS: Goals reviewed with patient? No  SHORT TERM GOALS: Target date: 02/22/24  Pt will be independent with HEP in order to demonstrate participation in Physical Therapy POC.  Baseline: Goal status: IN PROGRESS  2.  Pt will report 3/10 pain with mobility in order to demonstrate improved pain with ADLs.  Baseline:  Goal status: IN PROGRESS  LONG TERM GOALS: Target date: 03/14/24  Pt will improve Lumbar ROM by 10% in limited areas in order to demonstrate improved functional strength to return to desired activities.  Baseline: see objective.  Goal status: IN PROGRESS  2.  Pt will improve 30 Second Stand Chair Test by at least 1 rep in order to demonstrate improved functional ambulatory capacity in community setting.  Baseline: see objective.  Goal status: IN PROGRESS  3.  Pt will improve Modified Oswestry score by 15% in order to demonstrate improved pain with functional goals and outcomes. Baseline: see objective.  Goal status: IN PROGRESS  4.  Pt will report 0/10 pain with mobility in order to demonstrate reduced pain with ADLs lasting greater than 30 minutes.  Baseline: see objective.  Goal status: IN PROGRESS  5. Pt will improve SL balance to at least 11 seconds bilaterally demonstrate increased safety with functional mobility.   PLAN:  PT FREQUENCY: 1x/week  PT DURATION: 6 weeks  PLANNED INTERVENTIONS: 97164- PT Re-evaluation, 97750- Physical Performance Testing, 97110-Therapeutic exercises, 97530- Therapeutic activity, [id number]- Neuromuscular re-education, 97535- Self Care, [id number]- Manual therapy, [id number]- Gait training, [phone]- Electrical stimulation (unattended), [phone]- Electrical stimulation (manual), 20560 (1-2 muscles), 20561 (3+ muscles)- Dry Needling, Patient/Family education, Balance training,  Cryotherapy, and Moist heat.  PLAN FOR NEXT SESSION: single leg balance activities, decompression and lumbar ROM, B hip strengthening. Begin functional strengthening as appropriate.  Review goals prior MD apt on 03/12/24.  Augustin Mclean, LPTA/CLT; CBIS [phone]  11:01 AM, 03/06/24

## 2024-03-11 ENCOUNTER — Encounter (HOSPITAL_COMMUNITY)

## 2024-03-12 ENCOUNTER — Other Ambulatory Visit (INDEPENDENT_AMBULATORY_CARE_PROVIDER_SITE_OTHER): Payer: Worker's Compensation

## 2024-03-12 ENCOUNTER — Ambulatory Visit (INDEPENDENT_AMBULATORY_CARE_PROVIDER_SITE_OTHER): Payer: Worker's Compensation | Admitting: Orthopedic Surgery

## 2024-03-12 ENCOUNTER — Encounter: Payer: Self-pay | Admitting: Orthopedic Surgery

## 2024-03-12 DIAGNOSIS — S22080D Wedge compression fracture of T11-T12 vertebra, subsequent encounter for fracture with routine healing: Secondary | ICD-10-CM

## 2024-03-12 NOTE — Patient Instructions (Addendum)
 Note for work - out until the next visit in 6 weeks

## 2024-03-12 NOTE — Progress Notes (Unsigned)
 Return patient Visit  Assessment: Courtney Bradley is a 73 y.o. female with the following: 1. Compression fracture of T12 vertebra, subsequent encounter [facility])  Plan: Courtney Bradley is doing better.  Some residual pains.  Gets tired easily.  She has missed some PT due to travel and illness.  She will benefit from more PT.  Not ready to return to work yet.  Follow up in 6 weeks.   Follow-up: Return in about 6 weeks (around 04/23/2024).  Subjective:  Chief Complaint  Patient presents with   Fracture    Compression Fx T12 DOI 12/20/23    History of Present Illness: Courtney Bradley is a 73 y.o. female who returns for evaluation of back pain.  She injured her back at work, almost 3 months ago.  She sustained a compression fracture of T12.  She is doing better overall.  She is walking as much as 3 miles a day, although she feels she may be overdoing it.  She gets fatigued easily.  She has been working with PT, but missed some sessions because of illness and travel.  She is scheduled for the next 4-6 weeks.    Review of Systems: No fevers or chills No numbness or tingling No chest pain No shortness of breath No bowel or bladder dysfunction No GI distress No headaches      Objective: There were no vitals taken for this visit.  Physical Exam:  General: Alert and oriented. and No acute distress. Gait: Normal gait.  She is ambulating well.  She has some tenderness in the mid back area.  Good lower body strength.  Sensation is intact throughout bilateral lower extremities.  No additional injuries.   IMAGING: I personally ordered and reviewed the following images   XR of the thoracolumbar region were obtained in clinic today.  These are compared to prior images.  T12 compression is again visualized.  No change in appearance.  No displacement of fragments.  Compression has stabilized.  No new injuries.   Impression: Stable T12 compression fracture   New Medications:  No  orders of the defined types were placed in this encounter.     Courtney DELENA Horde, MD  03/13/2024 10:04 PM

## 2024-03-18 ENCOUNTER — Ambulatory Visit (HOSPITAL_COMMUNITY): Payer: Worker's Compensation

## 2024-03-18 DIAGNOSIS — Z7409 Other reduced mobility: Secondary | ICD-10-CM

## 2024-03-18 DIAGNOSIS — S22080D Wedge compression fracture of T11-T12 vertebra, subsequent encounter for fracture with routine healing: Secondary | ICD-10-CM | POA: Diagnosis not present

## 2024-03-18 DIAGNOSIS — M5386 Other specified dorsopathies, lumbar region: Secondary | ICD-10-CM

## 2024-03-18 NOTE — Therapy (Signed)
 OUTPATIENT PHYSICAL THERAPY THORACOLUMBAR TREATMENT/PROGRESS NOTE Progress Note Reporting Period 02/01/2024 to 03/18/2024  See note below for Objective Data and Assessment of Progress/Goals.       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   Elevated cholesterol 11/28/2016   GERD (gastroesophageal reflux disease)    History of kidney stones    Hormone replacement therapy (HRT) 09/19/2014   Lymphadenopathy    Thoracic, followed by [facility]   Obesity    Osteopenia    Pneumonia    PONV (postoperative nausea and vomiting)    Sarcoidosis    Past Surgical History:  Procedure Laterality Date   BIOPSY  04/09/2021   Procedure: BIOPSY;  Surgeon: Cindie Carlin POUR, DO;  Location: [facility];  Service: Endoscopy;;   BREAST CYST ASPIRATION     CHOLECYSTECTOMY     COLONOSCOPY N/A 11/17/2016   Procedure: COLONOSCOPY;  Surgeon: Claudis RAYMOND Rivet, MD;  Location: [facility];  Service: Endoscopy;  Laterality: N/A;  830   COLONOSCOPY WITH PROPOFOL  N/A 04/09/2021   Procedure: COLONOSCOPY WITH PROPOFOL ;  Surgeon: Cindie Carlin POUR, DO;  Location: [facility];  Service: Endoscopy;  Laterality: N/A;  8:30am   CORRECTION HAMMER TOE Bilateral    2nd   EYE SURGERY     GALLBLADDER SURGERY     LAPAROSCOPIC GASTRIC SLEEVE RESECTION WITH HIATAL HERNIA REPAIR N/A 03/14/2017   Procedure: LAPAROSCOPIC GASTRIC SLEEVE RESECTION WITH HIATAL HERNIA REPAIR;  Surgeon:  Tanda Locus, MD;  Location: [facility];  Service: General;  Laterality: N/A;   LITHOTRIPSY     neck lift     POLYPECTOMY  04/09/2021   Procedure: POLYPECTOMY INTESTINAL;  Surgeon: Cindie Carlin POUR, DO;  Location: [facility];  Service: Endoscopy;;   UPPER GI ENDOSCOPY  03/14/2017   Procedure: UPPER GI ENDOSCOPY;  Surgeon: Tanda Locus, MD;  Location: [facility];  Service: General;;   Patient Active Problem List   Diagnosis Date Noted   Elevated coronary artery calcium score 10/11/2023   Family history of premature CAD 10/11/2023   Encounter for screening fecal occult blood testing 04/18/2023   Encounter for gynecological examination with Papanicolaou smear of cervix 02/14/2022   Postmenopause 02/14/2022   Diarrhea 03/15/2021   Change in bowel function 03/15/2021   Sarcoidosis 10/09/2020   Erythrocytosis 01/22/2020   History of bariatric surgery 12/27/2019   Encounter for well woman exam with routine gynecological exam 12/27/2019   Gastroesophageal reflux disease 12/22/2017   Screening for colorectal cancer 12/22/2017   Encounter for counseling 12/22/2017   Morbid obesity (HCC) 03/14/2017   Osteopenia 03/14/2017   Dyslipidemia 03/14/2017   GERD (gastroesophageal reflux disease) 03/14/2017   Elevated cholesterol 11/28/2016   Special screening for malignant neoplasms, colon 08/11/2016   Well woman exam with routine gynecological exam 07/22/2016  Weight gain 07/22/2016   Hormone replacement therapy (HRT) 09/19/2014   Fatigue fracture of vertebra of sacral and sacrococcygeal region 01/26/2011   Disease of nail 06/12/2008   Closed fracture of sacrum and coccyx (HCC) 06/18/2007    PCP: Sheryle Carwin, MD  REFERRING PROVIDER: Onesimo Oneil LABOR, MD Next apt 03/12/24  REFERRING DIAG: t12 compression fracture   Rationale for Evaluation and Treatment: Rehabilitation  THERAPY DIAG:  Compression fracture of T12 vertebra with routine healing, subsequent encounter  Decreased ROM of lumbar  spine  Impaired functional mobility, balance, gait, and endurance  ONSET DATE: April of 2025  SUBJECTIVE:                                                                                                                                                                                           SUBJECTIVE STATEMENT: 80% better; minimal pain today 1-2/10 pain  Eval: 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 Felt a pull/pop, went back to work and recently been evaluated by Orthopedics, with T 12 compression fracture. Painful during initial walking. Pt walked 2 miles this morning. Pt is limited in daily chores and heavy activities at home, as avoided lifting due to the pain. Pt reports sleeping mainly on left side.   PERTINENT HISTORY:  Osteopenic   PAIN:  Are you having pain? Yes: NPRS scale: 0/10; worst 6/10 during transitions.  Pain location: middle of back Pain description: moderate, achy Aggravating factors: initial transitions from sitting  Relieving factors: Laying down on ice  PRECAUTIONS: None  RED FLAGS: None   WEIGHT BEARING RESTRICTIONS: No  FALLS:  Has patient fallen in last 6 months? No   PATIENT GOALS: I want to get back to doing those daily activities.   OBJECTIVE:  Note: Objective measures were completed at Evaluation unless otherwise noted.  DIAGNOSTIC FINDINGS:    PATIENT SURVEYS:  Modified Oswestry 14/50 = 28%   COGNITION: Overall cognitive status: Within functional limits for tasks assessed     SENSATION: WFL  POSTURE: decreased lumbar lordosis  PALPATION: Non tender to palpate along lumbar and thoracic paraspinals.  LUMBAR ROM:   AROM eval  Flexion 90% and painfree  Extension 50% and painful  Right lateral flexion 85% and painful  Left lateral flexion 85% and painful  Right rotation 90% and painfree  Left rotation 90% and painfree   (Blank rows = not tested)  LOWER EXTREMITY ROM:      Active  Right eval Left eval  Hip flexion    Hip extension    Hip abduction    Hip adduction  Hip internal rotation    Hip external rotation    Knee flexion    Knee extension    Ankle dorsiflexion    Ankle plantarflexion    Ankle inversion    Ankle eversion     (Blank rows = not tested)  LOWER EXTREMITY MMT:    MMT Right eval Left eval Right 03/18/24 Left 03/18/24  Hip flexion      Hip extension 3+ 3+    Hip abduction 3+ 3+    Hip adduction      Hip internal rotation      Hip external rotation      Knee flexion 3+ 3+    Knee extension      Ankle dorsiflexion 4- 4-    Ankle plantarflexion      Ankle inversion      Ankle eversion       (Blank rows = not tested)  FUNCTIONAL TESTS:  30 Second Chair Stand Test: 13  Norms:   Age 71-64 53-69 70-74 75-79 80-84 85-89 90-94  Women 15 15 14 13 12 11 9   Men 17 16 15 14 13 11 9      L SLS: 2.66 seconds  R SLS: 9.00 seconds  Norms: 18-39  F: 43.5 seconds  M: 43.2 seconds 40-49  F: 40.4 seconds  M: 40.1 seconds 50-59  F: 36 seconds  M: 38.1 seconds 60-69  F: 25.1 seconds  M: 28.7 seconds 70-79  F: 11.3 seconds  M: 18.3 seconds  GAIT: Distance walked: 66ft Assistive device utilized: None Level of assistance: Complete Independence Comments: Forward trunk flexion  TREATMENT DATE:  03/18/24 Progress note Modified Oswestry 12/50 24% 30 sec sit to stand x 15 SLS right 14, left 33 Nustep seat 7 level 2 x 5'      03/06/24: Standing:   RTB rows 20x 3 Shoulder extension 20x 3 3D hip excursion (squat, lateral weight shifting, rotation, no extension) SLS Lt 5, Rt 7 Vector stance 3x 5  Supine: Decompression with RTB 10x 5  02/26/24 Review of HEP and goals Supine: LTR x 10 Bridge 5 sec hold 2 x 10 Clam with green theraband 2 x 10 each side Decompression exercise  1-5  Updated HEP    02/01/2024 PT evaluation Prone on elbow x 2' Supine bridge x 12 LTR x 30 Clamshells with RTB x  12                                                                                                                                  PATIENT EDUCATION:  Education details: PT Evaluation, findings, prognosis, frequency, attendance policy, and HEP below. Person educated: Patient Education method: Explanation and Demonstration Education comprehension: verbalized understanding  HOME EXERCISE PROGRAM: 02/26/24 decompression exercises 1-5 Access Code: [email or web address] URL: [email or web address] Date: 02/01/2024 Prepared by: Omega Bottcher  Exercises - Static Prone on Elbows  - 1 x daily - 7 x weekly - 3 sets -  10 reps - Supine Lower Trunk Rotation  - 1 x daily - 7 x weekly - 3 sets - 10 reps - Supine Bridge with Resistance Band  - 1 x daily - 7 x weekly - 3 sets - 10 reps - Clamshell with Resistance  - 1 x daily - 7 x weekly - 3 sets - 10 reps  ASSESSMENT:  CLINICAL IMPRESSION: Progress note.  Good improvement with all objective measures. Has met STG and one LTG; added Nustep and scapular retractions with theraband to progress to going back to the gym.  Updated HEP.  Patient will benefit from continued skilled therapy services  to address deficits and promote return to optimal function.     Eval: Patient is a 73 y.o. female who was seen today for physical therapy evaluation and treatment for t12 compression fracture. Pt with T12 compression fracture, Pt with limited bending, lifting, and performing ADLs around the home due to pain and ROM limitations noted in objective. Pt also noted with BLE hip weakness and balance deficits that are of concerns for increasing falls risk. Pt will benefit from skilled Physical Therapy services to address deficits/limitations in order to improve functional and QOL.    OBJECTIVE IMPAIRMENTS: decreased balance, decreased mobility, difficulty walking, decreased ROM, decreased strength, hypomobility, postural dysfunction, and pain.   ACTIVITY LIMITATIONS:  carrying, lifting, bending, sitting, standing, stairs, transfers, and locomotion level  PARTICIPATION LIMITATIONS: community activity, occupation, and yard work  PERSONAL FACTORS: Age are also affecting patient's functional outcome.   REHAB POTENTIAL: Excellent  CLINICAL DECISION MAKING: Stable/uncomplicated  EVALUATION COMPLEXITY: Low   GOALS: Goals reviewed with patient? No  SHORT TERM GOALS: Target date: 02/22/24  Pt will be independent with HEP in order to demonstrate participation in Physical Therapy POC.  Baseline: Goal status: MET  2.  Pt will report 3/10 pain with mobility in order to demonstrate improved pain with ADLs.  Baseline:  Goal status: MET  LONG TERM GOALS: Target date: 03/14/24  Pt will improve Lumbar ROM by 10% in limited areas in order to demonstrate improved functional strength to return to desired activities.  Baseline: see objective.  Goal status: IN PROGRESS  2.  Pt will improve 30 Second Stand Chair Test by at least 1 rep in order to demonstrate improved functional ambulatory capacity in community setting.  Baseline: see objective.  Goal status: MET  3.  Pt will improve Modified Oswestry score by 15% in order to demonstrate improved pain with functional goals and outcomes. Baseline: see objective. 12/50 improved by Goal status: IN PROGRESS  4.  Pt will report 0/10 pain with mobility in order to demonstrate reduced pain with ADLs lasting greater than 30 minutes.  Baseline: see objective. ; Goal status: IN PROGRESS  5. Pt will improve SL balance to at least 11 seconds bilaterally demonstrate increased safety with functional mobility.     Baseline:met for the left but not the right; right 14 and left 33  Goals status: in progress  PLAN:  PT FREQUENCY: 1x/week  PT DURATION: 6 weeks  PLANNED INTERVENTIONS: 97164- PT Re-evaluation, 97750- Physical Performance Testing, 97110-Therapeutic exercises, 97530- Therapeutic activity, [id number]-  Neuromuscular re-education, 97535- Self Care, [id number]- Manual therapy, [id number]- Gait training, [phone]- Electrical stimulation (unattended), [id number]- Electrical stimulation (manual), 20560 (1-2 muscles), 20561 (3+ muscles)- Dry Needling, Patient/Family education, Balance training, Cryotherapy, and Moist heat.  PLAN FOR NEXT SESSION: single leg balance activities, decompression and lumbar ROM, B hip strengthening. Begin functional strengthening as appropriate.   11:46 AM,  03/18/24 Takuma Cifelli Small Ulice Follett MPT [facility] [place] [phone]

## 2024-03-28 NOTE — Addendum Note (Signed)
 Addended byBETHA LEODIS NO S on: 03/28/2024 03:25 PM   Modules accepted: Orders

## 2024-03-29 ENCOUNTER — Ambulatory Visit (HOSPITAL_COMMUNITY): Payer: Worker's Compensation | Attending: Orthopedic Surgery

## 2024-03-29 DIAGNOSIS — M5386 Other specified dorsopathies, lumbar region: Secondary | ICD-10-CM | POA: Insufficient documentation

## 2024-03-29 DIAGNOSIS — S22080D Wedge compression fracture of T11-T12 vertebra, subsequent encounter for fracture with routine healing: Secondary | ICD-10-CM | POA: Diagnosis present

## 2024-03-29 DIAGNOSIS — Z7409 Other reduced mobility: Secondary | ICD-10-CM | POA: Insufficient documentation

## 2024-03-29 NOTE — Therapy (Signed)
 OUTPATIENT PHYSICAL THERAPY THORACOLUMBAR TREATMENT    Patient Name: Courtney Bradley MRN: [id number] DOB:[birth year], 73 y.o., female Today's Date: 03/29/2024  END OF SESSION:  PT End of Session - 03/29/24 0757     Visit Number 5    Number of Visits 7    Date for PT Re-Evaluation 03/14/24    Authorization Type Worker's Comp    Progress Note Due on Visit 7    PT Start Time 0758    PT Stop Time 0839    PT Time Calculation (min) 41 min    Activity Tolerance Patient tolerated treatment well    Behavior During Therapy [facility] for tasks assessed/performed          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   Elevated cholesterol 11/28/2016   GERD (gastroesophageal reflux disease)    History of kidney stones    Hormone replacement therapy (HRT) 09/19/2014   Lymphadenopathy    Thoracic, followed by [facility]   Obesity    Osteopenia    Pneumonia    PONV (postoperative nausea and vomiting)    Sarcoidosis    Past Surgical History:  Procedure Laterality Date   BIOPSY  04/09/2021   Procedure: BIOPSY;  Surgeon: Cindie Carlin POUR, DO;  [facility]: [facility];  Service: Endoscopy;;   BREAST CYST ASPIRATION     CHOLECYSTECTOMY     COLONOSCOPY N/A 11/17/2016   Procedure: COLONOSCOPY;  Surgeon: Claudis RAYMOND Rivet, MD;  Location: [facility];  Service: Endoscopy;  Laterality: N/A;  830   COLONOSCOPY WITH PROPOFOL  N/A 04/09/2021   Procedure: COLONOSCOPY WITH PROPOFOL ;  Surgeon: Cindie Carlin POUR, DO;  Location: [facility];  Service: Endoscopy;  Laterality: N/A;  8:30am   CORRECTION HAMMER TOE Bilateral    2nd   EYE SURGERY     GALLBLADDER SURGERY     LAPAROSCOPIC GASTRIC SLEEVE RESECTION WITH HIATAL HERNIA REPAIR N/A 03/14/2017   Procedure: LAPAROSCOPIC GASTRIC SLEEVE RESECTION WITH HIATAL HERNIA REPAIR;  Surgeon: Tanda Locus, MD;  Location: [facility];  Service: General;  Laterality: N/A;   LITHOTRIPSY     neck lift     POLYPECTOMY  04/09/2021   Procedure:  POLYPECTOMY INTESTINAL;  Surgeon: Cindie Carlin POUR, DO;  [facility]: [facility];  Service: Endoscopy;;   UPPER GI ENDOSCOPY  03/14/2017   Procedure: UPPER GI ENDOSCOPY;  Surgeon: Tanda Locus, MD;  Location: [facility];  Service: General;;   Patient Active Problem List   Diagnosis Date Noted   Elevated coronary artery calcium score 10/11/2023   Family history of premature CAD 10/11/2023   Encounter for screening fecal occult blood testing 04/18/2023   Encounter for gynecological examination with Papanicolaou smear of cervix 02/14/2022   Postmenopause 02/14/2022   Diarrhea 03/15/2021   Change in bowel function 03/15/2021   Sarcoidosis 10/09/2020   Erythrocytosis 01/22/2020   History of bariatric surgery 12/27/2019   Encounter for well woman exam with routine gynecological exam 12/27/2019   Gastroesophageal reflux disease 12/22/2017   Screening for colorectal cancer 12/22/2017   Encounter for counseling 12/22/2017   Morbid obesity (HCC) 03/14/2017   Osteopenia 03/14/2017   Dyslipidemia 03/14/2017   GERD (gastroesophageal reflux disease) 03/14/2017   Elevated cholesterol 11/28/2016   Special screening for malignant neoplasms, colon 08/11/2016   Well woman exam with routine gynecological exam 07/22/2016   Weight gain 07/22/2016   Hormone replacement therapy (HRT) 09/19/2014   Fatigue fracture of vertebra of sacral and sacrococcygeal  region 01/26/2011   Disease of nail 06/12/2008   Closed fracture of sacrum and coccyx (HCC) 06/18/2007    PCP: Sheryle Carwin, MD  REFERRING PROVIDER: Onesimo Oneil LABOR, MD Next apt 03/12/24  REFERRING DIAG: t12 compression fracture   Rationale for Evaluation and Treatment: Rehabilitation  THERAPY DIAG:  Compression fracture of T12 vertebra with routine healing, subsequent encounter  Decreased ROM of lumbar spine  Impaired functional mobility, balance, gait, and endurance  ONSET DATE: April of 2025  SUBJECTIVE:                                                                                                                                                                                            SUBJECTIVE STATEMENT: Has been aching this week due to rainy weather.    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 Felt a pull/pop, went back to work and recently been evaluated by Orthopedics, with T 12 compression fracture. Painful during initial walking. Pt walked 2 miles this morning. Pt is limited in daily chores and heavy activities at home, as avoided lifting due to the pain. Pt reports sleeping mainly on left side.   PERTINENT HISTORY:  Osteopenic   PAIN:  Are you having pain? Yes: NPRS scale: 0/10; worst 6/10 during transitions.  Pain location: middle of back Pain description: moderate, achy Aggravating factors: initial transitions from sitting  Relieving factors: Laying down on ice  PRECAUTIONS: None  RED FLAGS: None   WEIGHT BEARING RESTRICTIONS: No  FALLS:  Has patient fallen in last 6 months? No   PATIENT GOALS: I want to get back to doing those daily activities.   OBJECTIVE:  Note: Objective measures were completed at Evaluation unless otherwise noted.  DIAGNOSTIC FINDINGS:    PATIENT SURVEYS:  Modified Oswestry 14/50 = 28%   COGNITION: Overall cognitive status: Within functional limits for tasks assessed     SENSATION: WFL  POSTURE: decreased lumbar lordosis  PALPATION: Non tender to palpate along lumbar and thoracic paraspinals.  LUMBAR ROM:   AROM eval  Flexion 90% and painfree  Extension 50% and painful  Right lateral flexion 85% and painful  Left lateral flexion 85% and painful  Right rotation 90% and painfree  Left rotation 90% and painfree   (Blank rows = not tested)  LOWER EXTREMITY ROM:     Active  Right eval Left eval  Hip flexion    Hip extension    Hip abduction    Hip adduction    Hip internal rotation    Hip external  rotation    Knee  flexion    Knee extension    Ankle dorsiflexion    Ankle plantarflexion    Ankle inversion    Ankle eversion     (Blank rows = not tested)  LOWER EXTREMITY MMT:    MMT Right eval Left eval Right 03/18/24 Left 03/18/24  Hip flexion      Hip extension 3+ 3+    Hip abduction 3+ 3+    Hip adduction      Hip internal rotation      Hip external rotation      Knee flexion 3+ 3+    Knee extension      Ankle dorsiflexion 4- 4-    Ankle plantarflexion      Ankle inversion      Ankle eversion       (Blank rows = not tested)  FUNCTIONAL TESTS:  30 Second Chair Stand Test: 13  Norms:   Age 36-64 110-69 70-74 75-79 80-84 85-89 90-94  Women 15 15 14 13 12 11 9   Men 17 16 15 14 13 11 9      L SLS: 2.66 seconds  R SLS: 9.00 seconds  Norms: 18-39  F: 43.5 seconds  M: 43.2 seconds 40-49  F: 40.4 seconds  M: 40.1 seconds 50-59  F: 36 seconds  M: 38.1 seconds 60-69  F: 25.1 seconds  M: 28.7 seconds 70-79  F: 11.3 seconds  M: 18.3 seconds  GAIT: Distance walked: 35ft Assistive device utilized: None Level of assistance: Complete Independence Comments: Forward trunk flexion  TREATMENT DATE:  03/29/24 Nustep seat 7 level 2 x 5' dynamic warm up Heel raises 2 x 10 Slant board 5 x 20 Hip abduction 2 x 10 Hip extension 2 x 10 Red theraband scapular retractions 2 x 10 Red theraband shoulder extensions 2 x 10 BOSU ball lunges 2 x 10 each    03/18/24 Progress note Modified Oswestry 12/50 24% 30 sec sit to stand x 15 SLS right 14, left 33 Nustep seat 7 level 2 x 5'   03/06/24: Standing:   RTB rows 20x 3 Shoulder extension 20x 3 3D hip excursion (squat, lateral weight shifting, rotation, no extension) SLS Lt 5, Rt 7 Vector stance 3x 5  Supine: Decompression with RTB 10x 5  02/26/24 Review of HEP and goals Supine: LTR x 10 Bridge 5 sec hold 2 x 10 Clam with green theraband 2 x 10 each side Decompression exercise  1-5  Updated  HEP    02/01/2024 PT evaluation Prone on elbow x 2' Supine bridge x 12 LTR x 30 Clamshells with RTB x 12                                                                                                                                  PATIENT EDUCATION:  Education details: PT Evaluation, findings, prognosis, frequency, attendance policy, and HEP below. Person educated: Patient Education method: Medical illustrator Education comprehension: verbalized  understanding  HOME EXERCISE PROGRAM: 02/26/24 decompression exercises 1-5 Access Code: [email or web address] URL: [email or web address] Date: 02/01/2024 Prepared by: Omega Bottcher  Exercises - Static Prone on Elbows  - 1 x daily - 7 x weekly - 3 sets - 10 reps - Supine Lower Trunk Rotation  - 1 x daily - 7 x weekly - 3 sets - 10 reps - Supine Bridge with Resistance Band  - 1 x daily - 7 x weekly - 3 sets - 10 reps - Clamshell with Resistance  - 1 x daily - 7 x weekly - 3 sets - 10 reps  ASSESSMENT:  CLINICAL IMPRESSION:  Today's session with focus on core and postural strength; balance.  Progressed standing exercises without issue; no increased pain.  Patient  needs occasional cues for posture with standing hip exercises to avoid trunk substitution.  Working hard in therapy.   Patient will benefit from continued skilled therapy services  to address deficits and promote return to optimal function.     Eval: Patient is a 73 y.o. female who was seen today for physical therapy evaluation and treatment for t12 compression fracture. Pt with T12 compression fracture, Pt with limited bending, lifting, and performing ADLs around the home due to pain and ROM limitations noted in objective. Pt also noted with BLE hip weakness and balance deficits that are of concerns for increasing falls risk. Pt will benefit from skilled Physical Therapy services to address deficits/limitations in order to improve functional and QOL.    OBJECTIVE  IMPAIRMENTS: decreased balance, decreased mobility, difficulty walking, decreased ROM, decreased strength, hypomobility, postural dysfunction, and pain.   ACTIVITY LIMITATIONS: carrying, lifting, bending, sitting, standing, stairs, transfers, and locomotion level  PARTICIPATION LIMITATIONS: community activity, occupation, and yard work  PERSONAL FACTORS: Age are also affecting patient's functional outcome.   REHAB POTENTIAL: Excellent  CLINICAL DECISION MAKING: Stable/uncomplicated  EVALUATION COMPLEXITY: Low   GOALS: Goals reviewed with patient? No  SHORT TERM GOALS: Target date: 02/22/24  Pt will be independent with HEP in order to demonstrate participation in Physical Therapy POC.  Baseline: Goal status: MET  2.  Pt will report 3/10 pain with mobility in order to demonstrate improved pain with ADLs.  Baseline:  Goal status: MET  LONG TERM GOALS: Target date: 03/14/24  Pt will improve Lumbar ROM by 10% in limited areas in order to demonstrate improved functional strength to return to desired activities.  Baseline: see objective.  Goal status: IN PROGRESS  2.  Pt will improve 30 Second Stand Chair Test by at least 1 rep in order to demonstrate improved functional ambulatory capacity in community setting.  Baseline: see objective.  Goal status: MET  3.  Pt will improve Modified Oswestry score by 15% in order to demonstrate improved pain with functional goals and outcomes. Baseline: see objective. 12/50 improved by Goal status: IN PROGRESS  4.  Pt will report 0/10 pain with mobility in order to demonstrate reduced pain with ADLs lasting greater than 30 minutes.  Baseline: see objective. ; Goal status: IN PROGRESS  5. Pt will improve SL balance to at least 11 seconds bilaterally demonstrate increased safety with functional mobility.     Baseline:met for the left but not the right; right 14 and left 33  Goals status: in progress  PLAN:  PT FREQUENCY: 1x/week  PT  DURATION: 6 weeks  PLANNED INTERVENTIONS: [id number]- PT Re-evaluation, 97750- Physical Performance Testing, 97110-Therapeutic exercises, 97530- Therapeutic activity, [id number]- Neuromuscular re-education, 97535- Self Care, [id number]- Manual therapy, [id number]- Gait training,  [id number]- Electrical stimulation (unattended), [id number]- Electrical stimulation (manual), 79439 (1-2 muscles), 20561 (3+ muscles)- Dry Needling, Patient/Family education, Balance training, Cryotherapy, and Moist heat.  PLAN FOR NEXT SESSION: single leg balance activities, decompression and lumbar ROM, B hip strengthening. Begin functional strengthening as appropriate.   8:43 AM, 03/29/24 [place] Boccio Small Amey Hossain MPT [place] [facility]  [phone]

## 2024-04-03 ENCOUNTER — Ambulatory Visit (HOSPITAL_COMMUNITY): Payer: Worker's Compensation | Admitting: Physical Therapy

## 2024-04-03 DIAGNOSIS — M5386 Other specified dorsopathies, lumbar region: Secondary | ICD-10-CM

## 2024-04-03 DIAGNOSIS — Z7409 Other reduced mobility: Secondary | ICD-10-CM

## 2024-04-03 DIAGNOSIS — S22080D Wedge compression fracture of T11-T12 vertebra, subsequent encounter for fracture with routine healing: Secondary | ICD-10-CM | POA: Diagnosis not present

## 2024-04-03 NOTE — Therapy (Signed)
 OUTPATIENT PHYSICAL THERAPY THORACOLUMBAR TREATMENT    Patient Name: Courtney Bradley MRN: [id number] DOB:[birth year], 73 y.o., female Today's Date: 04/03/2024  END OF SESSION:  PT End of Session - 04/03/24 0914     Visit Number 6    Number of Visits 7    Date for PT Re-Evaluation 03/14/24    Authorization Type Worker's Comp    Progress Note Due on Visit 7    PT Start Time 0804    PT Stop Time 0845    PT Time Calculation (min) 41 min    Activity Tolerance Patient tolerated treatment well    Behavior During Therapy [facility] for tasks assessed/performed           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   Elevated cholesterol 11/28/2016   GERD (gastroesophageal reflux disease)    History of kidney stones    Hormone replacement therapy (HRT) 09/19/2014   Lymphadenopathy    Thoracic, followed by [facility]   Obesity    Osteopenia    Pneumonia    PONV (postoperative nausea and vomiting)    Sarcoidosis    Past Surgical History:  Procedure Laterality Date   BIOPSY  04/09/2021   Procedure: BIOPSY;  Surgeon: Cindie Carlin POUR, DO;  [facility]: [facility];  Service: Endoscopy;;   BREAST CYST ASPIRATION     CHOLECYSTECTOMY     COLONOSCOPY N/A 11/17/2016   Procedure: COLONOSCOPY;  Surgeon: Claudis RAYMOND Rivet, MD;  Location: [facility];  Service: Endoscopy;  Laterality: N/A;  830   COLONOSCOPY WITH PROPOFOL  N/A 04/09/2021   Procedure: COLONOSCOPY WITH PROPOFOL ;  Surgeon: Cindie Carlin POUR, DO;  Location: [facility];  Service: Endoscopy;  Laterality: N/A;  8:30am   CORRECTION HAMMER TOE Bilateral    2nd   EYE SURGERY     GALLBLADDER SURGERY     LAPAROSCOPIC GASTRIC SLEEVE RESECTION WITH HIATAL HERNIA REPAIR N/A 03/14/2017   Procedure: LAPAROSCOPIC GASTRIC SLEEVE RESECTION WITH HIATAL HERNIA REPAIR;  Surgeon: Tanda Locus, MD;  Location: [facility];  Service: General;  Laterality: N/A;   LITHOTRIPSY     neck lift     POLYPECTOMY  04/09/2021   Procedure:  POLYPECTOMY INTESTINAL;  Surgeon: Cindie Carlin POUR, DO;  Location: [facility];  Service: Endoscopy;;   UPPER GI ENDOSCOPY  03/14/2017   Procedure: UPPER GI ENDOSCOPY;  Surgeon: Tanda Locus, MD;  Location: [facility];  Service: General;;   Patient Active Problem List   Diagnosis Date Noted   Elevated coronary artery calcium score 10/11/2023   Family history of premature CAD 10/11/2023   Encounter for screening fecal occult blood testing 04/18/2023   Encounter for gynecological examination with Papanicolaou smear of cervix 02/14/2022   Postmenopause 02/14/2022   Diarrhea 03/15/2021   Change in bowel function 03/15/2021   Sarcoidosis 10/09/2020   Erythrocytosis 01/22/2020   History of bariatric surgery 12/27/2019   Encounter for well woman exam with routine gynecological exam 12/27/2019   Gastroesophageal reflux disease 12/22/2017   Screening for colorectal cancer 12/22/2017   Encounter for counseling 12/22/2017   Morbid obesity (HCC) 03/14/2017   Osteopenia 03/14/2017   Dyslipidemia 03/14/2017   GERD (gastroesophageal reflux disease) 03/14/2017   Elevated cholesterol 11/28/2016   Special screening for malignant neoplasms, colon 08/11/2016   Well woman exam with routine gynecological exam 07/22/2016   Weight gain 07/22/2016   Hormone replacement therapy (HRT) 09/19/2014   Fatigue fracture of vertebra of sacral and  sacrococcygeal region 01/26/2011   Disease of nail 06/12/2008   Closed fracture of sacrum and coccyx (HCC) 06/18/2007    PCP: Sheryle Carwin, MD  REFERRING PROVIDER: Onesimo Oneil LABOR, MD Next apt 03/12/24  REFERRING DIAG: t12 compression fracture   Rationale for Evaluation and Treatment: Rehabilitation  THERAPY DIAG:  Compression fracture of T12 vertebra with routine healing, subsequent encounter  Decreased ROM of lumbar spine  Impaired functional mobility, balance, gait, and endurance  ONSET DATE: April of 2025  SUBJECTIVE:                                                                                                                                                                                            SUBJECTIVE STATEMENT: Pt reports she is having some pain in her midback today.  Had none when she got up this morning but increases as day goes on.   States she was suppose to walk after her session but the weather may not comply.    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 Felt a pull/pop, went back to work and recently been evaluated by Orthopedics, with T 12 compression fracture. Painful during initial walking. Pt walked 2 miles this morning. Pt is limited in daily chores and heavy activities at home, as avoided lifting due to the pain. Pt reports sleeping mainly on left side.   PERTINENT HISTORY:  Osteopenic   PAIN:  Are you having pain? Yes: NPRS scale: 0/10; worst 6/10 during transitions.  Pain location: middle of back Pain description: moderate, achy Aggravating factors: initial transitions from sitting  Relieving factors: Laying down on ice  PRECAUTIONS: None  RED FLAGS: None   WEIGHT BEARING RESTRICTIONS: No  FALLS:  Has patient fallen in last 6 months? No   PATIENT GOALS: I want to get back to doing those daily activities.   OBJECTIVE:  Note: Objective measures were completed at Evaluation unless otherwise noted.  DIAGNOSTIC FINDINGS:    PATIENT SURVEYS:  Modified Oswestry 14/50 = 28%   COGNITION: Overall cognitive status: Within functional limits for tasks assessed     SENSATION: WFL  POSTURE: decreased lumbar lordosis  PALPATION: Non tender to palpate along lumbar and thoracic paraspinals.  LUMBAR ROM:   AROM eval  Flexion 90% and painfree  Extension 50% and painful  Right lateral flexion 85% and painful  Left lateral flexion 85% and painful  Right rotation 90% and painfree  Left rotation 90% and painfree   (Blank rows = not tested)  LOWER  EXTREMITY ROM:     Active  Right eval Left  eval  Hip flexion    Hip extension    Hip abduction    Hip adduction    Hip internal rotation    Hip external rotation    Knee flexion    Knee extension    Ankle dorsiflexion    Ankle plantarflexion    Ankle inversion    Ankle eversion     (Blank rows = not tested)  LOWER EXTREMITY MMT:    MMT Right eval Left eval Right 03/18/24 Left 03/18/24  Hip flexion      Hip extension 3+ 3+    Hip abduction 3+ 3+    Hip adduction      Hip internal rotation      Hip external rotation      Knee flexion 3+ 3+    Knee extension      Ankle dorsiflexion 4- 4-    Ankle plantarflexion      Ankle inversion      Ankle eversion       (Blank rows = not tested)  FUNCTIONAL TESTS:  30 Second Chair Stand Test: 13  Norms:   Age 17-64 61-69 70-74 75-79 80-84 85-89 90-94  Women 15 15 14 13 12 11 9   Men 17 16 15 14 13 11 9      L SLS: 2.66 seconds  R SLS: 9.00 seconds  Norms: 18-39  F: 43.5 seconds  M: 43.2 seconds 40-49  F: 40.4 seconds  M: 40.1 seconds 50-59  F: 36 seconds  M: 38.1 seconds 60-69  F: 25.1 seconds  M: 28.7 seconds 70-79  F: 11.3 seconds  M: 18.3 seconds  GAIT: Distance walked: 55ft Assistive device utilized: None Level of assistance: Complete Independence Comments: Forward trunk flexion  TREATMENT DATE:  04/03/24 Nustep seat 7 level 2 x 5' dynamic warm up Standing: Slant board 3 x 20 Hip abduction 2 x 10 GTB Hip extension 2 x 10 GTB Green theraband scapular retractions 2 x 10 Green theraband shoulder extensions 2 x 10 BOSU ball lunges 2 x 10 each Prone on elbows, mini press ups 2-3 reps quadruped mid back stretch and cat/camel 3X   03/29/24 Nustep seat 7 level 2 x 5' dynamic warm up Heel raises 2 x 10 Slant board 5 x 20 Hip abduction 2 x 10 Hip extension 2 x 10 Red theraband scapular retractions 2 x 10 Red theraband shoulder extensions 2 x 10 BOSU ball lunges 2 x 10 each  03/18/24 Progress  note Modified Oswestry 12/50 24% 30 sec sit to stand x 15 SLS right 14, left 33 Nustep seat 7 level 2 x 5'   03/06/24: Standing:   RTB rows 20x 3 Shoulder extension 20x 3 3D hip excursion (squat, lateral weight shifting, rotation, no extension) SLS Lt 5, Rt 7 Vector stance 3x 5  Supine: Decompression with RTB 10x 5  02/26/24 Review of HEP and goals Supine: LTR x 10 Bridge 5 sec hold 2 x 10 Clam with green theraband 2 x 10 each side Decompression exercise  1-5  Updated HEP    02/01/2024 PT evaluation Prone on elbow x 2' Supine bridge x 12 LTR x 30 Clamshells with RTB x 12  PATIENT EDUCATION:  Education details: PT Evaluation, findings, prognosis, frequency, attendance policy, and HEP below. Person educated: Patient Education method: Explanation and Demonstration Education comprehension: verbalized understanding  HOME EXERCISE PROGRAM: 02/26/24 decompression exercises 1-5 Access Code: [email or web address] URL: [email or web address] Date: 02/01/2024 Prepared by: Omega Bottcher  Exercises - Static Prone on Elbows  - 1 x daily - 7 x weekly - 3 sets - 10 reps - Supine Lower Trunk Rotation  - 1 x daily - 7 x weekly - 3 sets - 10 reps - Supine Bridge with Resistance Band  - 1 x daily - 7 x weekly - 3 sets - 10 reps - Clamshell with Resistance  - 1 x daily - 7 x weekly - 3 sets - 10 reps  ASSESSMENT:  CLINICAL IMPRESSION: Continued with focus on core and postural strength with LE stability challenges.  Continued with postural theraband strengthening and increased resistance to next level today.  Palloff activity began with verbal and tactile cues needed to maintain forward, upright posturing and to engage core mm.  Bands added to hip strengthening without issues.  Pt with noted tightness in gastroc mm with stretch verbalized with  minimal ROM.  Forward lunges onto BOSU completed without UE assist to further challenge stability.  Reviewed prone on elbows as given at evaluation, however admits to not doing this at home.  Encouraged to spend time in prone and also complete mini press ups to encourage extension.  Quadruped stretches completed with cat/cow being challenging with minimal mobility achieved.  Patient will benefit from continued skilled therapy services  to address deficits and promote return to optimal function.     Eval: Patient is a 73 y.o. female who was seen today for physical therapy evaluation and treatment for t12 compression fracture. Pt with T12 compression fracture, Pt with limited bending, lifting, and performing ADLs around the home due to pain and ROM limitations noted in objective. Pt also noted with BLE hip weakness and balance deficits that are of concerns for increasing falls risk. Pt will benefit from skilled Physical Therapy services to address deficits/limitations in order to improve functional and QOL.    OBJECTIVE IMPAIRMENTS: decreased balance, decreased mobility, difficulty walking, decreased ROM, decreased strength, hypomobility, postural dysfunction, and pain.   ACTIVITY LIMITATIONS: carrying, lifting, bending, sitting, standing, stairs, transfers, and locomotion level  PARTICIPATION LIMITATIONS: community activity, occupation, and yard work  PERSONAL FACTORS: Age are also affecting patient's functional outcome.   REHAB POTENTIAL: Excellent  CLINICAL DECISION MAKING: Stable/uncomplicated  EVALUATION COMPLEXITY: Low   GOALS: Goals reviewed with patient? No  SHORT TERM GOALS: Target date: 02/22/24  Pt will be independent with HEP in order to demonstrate participation in Physical Therapy POC.  Baseline: Goal status: MET  2.  Pt will report 3/10 pain with mobility in order to demonstrate improved pain with ADLs.  Baseline:  Goal status: MET  LONG TERM GOALS: Target date:  03/14/24  Pt will improve Lumbar ROM by 10% in limited areas in order to demonstrate improved functional strength to return to desired activities.  Baseline: see objective.  Goal status: IN PROGRESS  2.  Pt will improve 30 Second Stand Chair Test by at least 1 rep in order to demonstrate improved functional ambulatory capacity in community setting.  Baseline: see objective.  Goal status: MET  3.  Pt will improve Modified Oswestry score by 15% in order to demonstrate improved pain with functional goals and outcomes. Baseline: see objective. 12/50 improved by Goal status: IN PROGRESS  4.  Pt will report 0/10 pain with mobility in order to demonstrate reduced pain with ADLs lasting greater than 30 minutes.  Baseline: see objective. ; Goal status: IN PROGRESS  5. Pt will improve SL balance to at least 11 seconds bilaterally demonstrate increased safety with functional mobility.     Baseline:met for the left but not the right; right 14 and left 33  Goals status: in progress  PLAN:  PT FREQUENCY: 1x/week  PT DURATION: 6 weeks  PLANNED INTERVENTIONS: [id number]- PT Re-evaluation, 97750- Physical Performance Testing, 97110-Therapeutic exercises, 97530- Therapeutic activity, [id number]- Neuromuscular re-education, 97535- Self Care, [id number]- Manual therapy, [id number]- Gait training, [phone]- Electrical stimulation (unattended), [id number]- Electrical stimulation (manual), 20560 (1-2 muscles), 20561 (3+ muscles)- Dry Needling, Patient/Family education, Balance training, Cryotherapy, and Moist heat.  PLAN FOR NEXT SESSION: next session progress single leg balance activities.  Encourage decompression and lumbar ROM, B hip strengthening.    9:15 AM, 04/03/24 Kaleia Longhi Small Lynch MPT [facility] [place] [phone]

## 2024-04-10 ENCOUNTER — Ambulatory Visit (HOSPITAL_COMMUNITY): Admitting: Physical Therapy

## 2024-04-10 DIAGNOSIS — M5386 Other specified dorsopathies, lumbar region: Secondary | ICD-10-CM

## 2024-04-10 DIAGNOSIS — S22080D Wedge compression fracture of T11-T12 vertebra, subsequent encounter for fracture with routine healing: Secondary | ICD-10-CM | POA: Diagnosis not present

## 2024-04-10 DIAGNOSIS — Z7409 Other reduced mobility: Secondary | ICD-10-CM

## 2024-04-10 NOTE — Therapy (Signed)
 OUTPATIENT PHYSICAL THERAPY THORACOLUMBAR TREATMENT Progress Note Reporting Period 03/18/2024 to 04/10/2024  See note below for Objective Data and Assessment of Progress/Goals.       Patient Name: Courtney Bradley MRN: [id number] DOB:1951/03/15, 73 y.o., female Today's Date: 04/10/2024  END OF SESSION:  PT End of Session - 04/10/24 1134     Visit Number 7    Number of Visits 7    Date for PT Re-Evaluation 05/22/24    Authorization Type Worker's Comp; requested more visits 8/20    Authorization - Visit Number 7    Authorization - Number of Visits 7    PT Start Time 0932    PT Stop Time 1015    PT Time Calculation (min) 43 min    Activity Tolerance Patient tolerated treatment well    Behavior During Therapy [facility] for tasks assessed/performed            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   Elevated cholesterol 11/28/2016   GERD (gastroesophageal reflux disease)    History of kidney stones    Hormone replacement therapy (HRT) 09/19/2014   Lymphadenopathy    Thoracic, followed by [facility]   Obesity    Osteopenia    Pneumonia    PONV (postoperative nausea and vomiting)    Sarcoidosis    Past Surgical History:  Procedure Laterality Date   BIOPSY  04/09/2021   Procedure: BIOPSY;  Surgeon: Cindie Carlin POUR, DO;  [facility];  Service: Endoscopy;;   BREAST CYST ASPIRATION     CHOLECYSTECTOMY     COLONOSCOPY N/A 11/17/2016   Procedure: COLONOSCOPY;  Surgeon: Claudis RAYMOND Rivet, MD;  Location: [facility];  Service: Endoscopy;  Laterality: N/A;  830   COLONOSCOPY WITH PROPOFOL  N/A 04/09/2021   Procedure: COLONOSCOPY WITH PROPOFOL ;  Surgeon: Cindie Carlin POUR, DO;  [facility]: [facility];  Service: Endoscopy;  Laterality: N/A;  8:30am   CORRECTION HAMMER TOE Bilateral    2nd   EYE SURGERY     GALLBLADDER SURGERY     LAPAROSCOPIC GASTRIC SLEEVE RESECTION WITH HIATAL HERNIA REPAIR N/A 03/14/2017   Procedure: LAPAROSCOPIC GASTRIC  SLEEVE RESECTION WITH HIATAL HERNIA REPAIR;  Surgeon: Tanda Locus, MD;  Location: [facility];  Service: General;  Laterality: N/A;   LITHOTRIPSY     neck lift     POLYPECTOMY  04/09/2021   Procedure: POLYPECTOMY INTESTINAL;  Surgeon: Cindie Carlin POUR, DO;  [facility];  Service: Endoscopy;;   UPPER GI ENDOSCOPY  03/14/2017   Procedure: UPPER GI ENDOSCOPY;  Surgeon: Tanda Locus, MD;  Location: [facility];  Service: General;;   Patient Active Problem List   Diagnosis Date Noted   Elevated coronary artery calcium score 10/11/2023   Family history of premature CAD 10/11/2023   Encounter for screening fecal occult blood testing 04/18/2023   Encounter for gynecological examination with Papanicolaou smear of cervix 02/14/2022   Postmenopause 02/14/2022   Diarrhea 03/15/2021   Change in bowel function 03/15/2021   Sarcoidosis 10/09/2020   Erythrocytosis 01/22/2020   History of bariatric surgery 12/27/2019   Encounter for well woman exam with routine gynecological exam 12/27/2019   Gastroesophageal reflux disease 12/22/2017   Screening for colorectal cancer 12/22/2017   Encounter for counseling 12/22/2017   Morbid obesity (HCC) 03/14/2017   Osteopenia 03/14/2017   Dyslipidemia 03/14/2017   GERD (gastroesophageal reflux disease) 03/14/2017   Elevated cholesterol 11/28/2016   Special screening for malignant  neoplasms, colon 08/11/2016   Well woman exam with routine gynecological exam 07/22/2016   Weight gain 07/22/2016   Hormone replacement therapy (HRT) 09/19/2014   Fatigue fracture of vertebra of sacral and sacrococcygeal region 01/26/2011   Disease of nail 06/12/2008   Closed fracture of sacrum and coccyx (HCC) 06/18/2007    PCP: Sheryle Carwin, MD  REFERRING PROVIDER: Onesimo Oneil LABOR, MD Next apt 03/12/24  REFERRING DIAG: t12 compression fracture   Rationale for Evaluation and Treatment: Rehabilitation  THERAPY DIAG:  Compression fracture of T12 vertebra with routine  healing, subsequent encounter  Decreased ROM of lumbar spine  Impaired functional mobility, balance, gait, and endurance  ONSET DATE: April of 2025  SUBJECTIVE:                                                                                                                                                                                           SUBJECTIVE STATEMENT: Pt has not gone back to work yet.  Returns to Dr Onesimo on 9/12 but [facility] is sending her for neuro referral next week.  Unsure of RTW at this point but does not feel like she's ready.  Continues to walk for exercise approx 2-3 miles, however was walking further at faster pace before her accident.  Pt reports she is having some pain in her midback today but not as bad as last week.   Reports some days are better than others depending on her activity.  First waking 0/10 but gets as high as 6/10.    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 Felt a pull/pop, went back to work and recently been evaluated by Orthopedics, with T 12 compression fracture. Painful during initial walking. Pt walked 2 miles this morning. Pt is limited in daily chores and heavy activities at home, as avoided lifting due to the pain. Pt reports sleeping mainly on left side.   PERTINENT HISTORY:  Osteopenic   PAIN:  Are you having pain? Yes: NPRS scale: 0/10; worst 6/10 during transitions.  Pain location: middle of back Pain description: moderate, achy Aggravating factors: initial transitions from sitting  Relieving factors: Laying down on ice  PRECAUTIONS: None  RED FLAGS: None   WEIGHT BEARING RESTRICTIONS: No  FALLS:  Has patient fallen in last 6 months? No   PATIENT GOALS: I want to get back to doing those daily activities.   OBJECTIVE:  Note: Objective measures were completed at Evaluation unless otherwise noted.  DIAGNOSTIC FINDINGS:    PATIENT SURVEYS:  Modified Oswestry 14/50 = 28%   04/10/24:  15 / 50 =  30.0 %   COGNITION: Overall cognitive status: Within functional limits for tasks assessed     SENSATION: WFL  POSTURE: decreased lumbar lordosis  PALPATION: Non tender to palpate along lumbar and thoracic paraspinals.  LUMBAR ROM:   AROM eval 04/10/24  Flexion 90% and painfree 95% available  Extension 50% and painful 75% available  Right lateral flexion 85% and painful 90% Available   Left lateral flexion 85% and painful 90% available  Right rotation 90% and painfree WNL  Left rotation 90% and painfree WNL   (Blank rows = not tested)   LOWER EXTREMITY MMT:    MMT Right eval Left eval Right 04/10/24 Left 04/10/24  Hip flexion   4 4+  Hip extension 3+ 3+ 3+ 4-  Hip abduction 3+ 3+ 4- 4  Hip adduction      Hip internal rotation      Hip external rotation      Knee flexion 3+ 3+ 5 5  Knee extension   5 5  Ankle dorsiflexion 4- 4- 5 5  Ankle plantarflexion      Ankle inversion      Ankle eversion       (Blank rows = not tested)  FUNCTIONAL TESTS:  30 Second Chair Stand Test: 13  Norms:   Age 72-64 35-69 70-74 75-79 80-84 85-89 90-94  Women 15 15 14 13 12 11 9   Men 17 16 15 14 13 11 9      L SLS: 2.66 seconds  R SLS: 9.00 seconds  Norms: 18-39  F: 43.5 seconds  M: 43.2 seconds 40-49  F: 40.4 seconds  M: 40.1 seconds 50-59  F: 36 seconds  M: 38.1 seconds 60-69  F: 25.1 seconds  M: 28.7 seconds 70-79  F: 11.3 seconds  M: 18.3 seconds  04/10/24 Functional test measures:   30 Second Chair Stand Test:  17x   (at eval was 13X)  SLS:  Lt:28   Rt:30     (at eval was Lt: 2.66 seconds  Rt: 9.00 seconds)    GAIT: Distance walked: 16ft Assistive device utilized: None Level of assistance: Complete Independence Comments: Forward trunk flexion  TREATMENT DATE:  04/10/24 Functional test measures:   AROM see above chart  MMT see above chart  Modified oswestry:   15 / 50 = 30.0 %      (at eval was 14/50 = 28%)  30 Second  Chair Stand Test:  17x   (at eval was 13X)  SLS:  Lt:28   Rt:30     (at eval was Lt: 2.66 seconds  Rt: 9.00 seconds) Goal review     04/03/24 Nustep seat 7 level 2 x 5' dynamic warm up Standing: Slant board 3 x 20 Hip abduction 2 x 10 GTB Hip extension 2 x 10 GTB Green theraband scapular retractions 2 x 10 Green theraband shoulder extensions 2 x 10 BOSU ball lunges 2 x 10 each Prone on elbows, mini press ups 2-3 reps quadruped mid back stretch and cat/camel 3X   03/29/24 Nustep seat 7 level 2 x 5' dynamic warm up Heel raises 2 x 10 Slant board 5 x 20 Hip abduction 2 x 10 Hip extension 2 x 10 Red theraband scapular retractions 2 x 10 Red theraband shoulder extensions 2 x 10 BOSU ball lunges 2 x 10 each  03/18/24 Progress note Modified Oswestry 12/50 24% 30 sec sit to stand x 15 SLS right 14, left 33 Nustep seat 7 level 2 x 5'  03/06/24: Standing:   RTB rows 20x 3 Shoulder extension 20x 3 3D hip excursion (squat, lateral weight shifting, rotation, no extension) SLS Lt 5, Rt 7 Vector stance 3x 5  Supine: Decompression with RTB 10x 5  02/26/24 Review of HEP and goals Supine: LTR x 10 Bridge 5 sec hold 2 x 10 Clam with green theraband 2 x 10 each side Decompression exercise  1-5  Updated HEP    02/01/2024 PT evaluation Prone on elbow x 2' Supine bridge x 12 LTR x 30 Clamshells with RTB x 12                                                                                                                                  PATIENT EDUCATION:  Education details: PT Evaluation, findings, prognosis, frequency, attendance policy, and HEP below. Person educated: Patient Education method: Explanation and Demonstration Education comprehension: verbalized understanding  HOME EXERCISE PROGRAM: 02/26/24 decompression exercises 1-5 Access Code: [email or web address] URL: [email or web address] Date: 02/01/2024 Prepared by: Omega Bottcher  Exercises -  Static Prone on Elbows  - 1 x daily - 7 x weekly - 3 sets - 10 reps - Supine Lower Trunk Rotation  - 1 x daily - 7 x weekly - 3 sets - 10 reps - Supine Bridge with Resistance Band  - 1 x daily - 7 x weekly - 3 sets - 10 reps - Clamshell with Resistance  - 1 x daily - 7 x weekly - 3 sets - 10 reps  ASSESSMENT:  CLINICAL IMPRESSION: Progress note completed this session. Pt has made good gains and progress towards goals, however has not yet met all long term goals.  Pt continues to be limited by pain with increased activity, decreased hip strength and stability and return to full lumbar mobility.  Pt has not yet returned to work or full recreational activities due to limitations.   Patient will benefit from continued skilled therapy services  to address deficits and promote return to optimal function.     Eval: Patient is a 73 y.o. female who was seen today for physical therapy evaluation and treatment for t12 compression fracture. Pt with T12 compression fracture, Pt with limited bending, lifting, and performing ADLs around the home due to pain and ROM limitations noted in objective. Pt also noted with BLE hip weakness and balance deficits that are of concerns for increasing falls risk. Pt will benefit from skilled Physical Therapy services to address deficits/limitations in order to improve functional and QOL.    OBJECTIVE IMPAIRMENTS: decreased balance, decreased mobility, difficulty walking, decreased ROM, decreased strength, hypomobility, postural dysfunction, and pain.   ACTIVITY LIMITATIONS: carrying, lifting, bending, sitting, standing, stairs, transfers, and locomotion level  PARTICIPATION LIMITATIONS: community activity, occupation, and yard work  PERSONAL FACTORS: Age are also affecting patient's functional outcome.   REHAB POTENTIAL: Excellent  CLINICAL DECISION MAKING: Stable/uncomplicated  EVALUATION COMPLEXITY: Low   GOALS: Goals reviewed with patient? No  SHORT TERM GOALS:  Target date: 02/22/24  Pt will be independent with HEP in order to demonstrate participation in Physical Therapy POC.  Baseline: Goal status: MET  2.  Pt will report 3/10 pain with mobility in order to demonstrate improved pain with ADLs.  Baseline:  Goal status: MET  LONG TERM GOALS: Target date: 03/14/24  Pt will improve Lumbar ROM by 10% in limited areas in order to demonstrate improved functional strength to return to desired activities.  Baseline: see objective.  Goal status: IN PROGRESS  2.  Pt will improve 30 Second Stand Chair Test by at least 1 rep in order to demonstrate improved functional ambulatory capacity in community setting.  Baseline: see objective.  Goal status: MET  3.  Pt will improve Modified Oswestry score by 15% in order to demonstrate improved pain with functional goals and outcomes. Baseline: see objective. 12/50 improved by Goal status: IN PROGRESS  4.  Pt will report 0/10 pain with mobility in order to demonstrate reduced pain with ADLs lasting greater than 30 minutes.  Baseline: see objective. ; Goal status: IN PROGRESS  5. Pt will improve SL balance to at least 11 seconds bilaterally demonstrate increased safety with functional mobility.    Baseline:met for the left but not the right; right 14 and left 33  Goals status: MET  PLAN:  PT FREQUENCY: 1x/week  PT DURATION: 6 weeks  PLANNED INTERVENTIONS: 97164- PT Re-evaluation, 97750- Physical Performance Testing, 97110-Therapeutic exercises, 97530- Therapeutic activity, [id number]- Neuromuscular re-education, 97535- Self Care, 02859- Manual therapy, [id number]- Gait training, [phone]- Electrical stimulation (unattended), [id number]- Electrical stimulation (manual), 20560 (1-2 muscles), 20561 (3+ muscles)- Dry Needling, Patient/Family education, Balance training, Cryotherapy, and Moist heat.  PLAN FOR NEXT SESSION: continue 1X week for  6 weeks with focus on improving LE strength, higher level balance and stabilization  of core.  Re-cert due 9/4 (until 10/1)    11:35 AM, 04/10/24 Greig KATHEE Fuse, PTA/CLT [facility] Ph: [phone]

## 2024-04-16 DIAGNOSIS — X32XXXD Exposure to sunlight, subsequent encounter: Secondary | ICD-10-CM | POA: Diagnosis not present

## 2024-04-16 DIAGNOSIS — Z08 Encounter for follow-up examination after completed treatment for malignant neoplasm: Secondary | ICD-10-CM | POA: Diagnosis not present

## 2024-04-16 DIAGNOSIS — Z85828 Personal history of other malignant neoplasm of skin: Secondary | ICD-10-CM | POA: Diagnosis not present

## 2024-04-16 DIAGNOSIS — L570 Actinic keratosis: Secondary | ICD-10-CM | POA: Diagnosis not present

## 2024-04-16 DIAGNOSIS — D045 Carcinoma in situ of skin of trunk: Secondary | ICD-10-CM | POA: Diagnosis not present

## 2024-04-17 ENCOUNTER — Encounter (HOSPITAL_COMMUNITY)

## 2024-04-23 ENCOUNTER — Ambulatory Visit: Admitting: Adult Health

## 2024-04-23 ENCOUNTER — Encounter: Payer: Self-pay | Admitting: Adult Health

## 2024-04-23 ENCOUNTER — Ambulatory Visit: Admitting: Orthopedic Surgery

## 2024-04-23 VITALS — BP 114/79 | HR 74 | Ht 66.0 in | Wt 144.0 lb

## 2024-04-23 DIAGNOSIS — N951 Menopausal and female climacteric states: Secondary | ICD-10-CM | POA: Diagnosis not present

## 2024-04-23 DIAGNOSIS — Z780 Asymptomatic menopausal state: Secondary | ICD-10-CM

## 2024-04-23 DIAGNOSIS — Z1211 Encounter for screening for malignant neoplasm of colon: Secondary | ICD-10-CM

## 2024-04-23 DIAGNOSIS — Z79890 Hormone replacement therapy (postmenopausal): Secondary | ICD-10-CM | POA: Diagnosis not present

## 2024-04-23 DIAGNOSIS — Z1332 Encounter for screening for maternal depression: Secondary | ICD-10-CM

## 2024-04-23 DIAGNOSIS — R232 Flushing: Secondary | ICD-10-CM | POA: Diagnosis not present

## 2024-04-23 DIAGNOSIS — Z01419 Encounter for gynecological examination (general) (routine) without abnormal findings: Secondary | ICD-10-CM | POA: Diagnosis not present

## 2024-04-23 LAB — HEMOCCULT GUIAC POC 1CARD (OFFICE): Fecal Occult Blood, POC: NEGATIVE

## 2024-04-23 MED ORDER — CLIMARA PRO 0.045-0.015 MG/DAY TD PTWK: MEDICATED_PATCH | TRANSDERMAL | 6 refills | Status: AC

## 2024-04-23 MED ORDER — SERTRALINE HCL 50 MG PO TABS: 50.0000 mg | ORAL_TABLET | Freq: Every day | ORAL | 3 refills | Status: AC

## 2024-04-23 NOTE — Progress Notes (Signed)
 Patient ID: Courtney Bradley, female   DOB: 03-09-1951, 73 y.o.   MRN: [id number] History of Present Illness: Courtney Bradley is a 73 year old white female,married, PM in for a well woman gyn exam. She is still on climara  pro, but only uses one every  2 weeks, still gets hot flashes and sweats, if not using some.  She has lost more weight on Mounjaro. She hurt back at work in May and is out of work, going to PT.      Component Value Date/Time   DIAGPAP  02/14/2022 0856    - Negative for intraepithelial lesion or malignancy (NILM)   DIAGPAP  12/22/2017 0000    NEGATIVE FOR INTRAEPITHELIAL LESIONS OR MALIGNANCY.   DIAGPAP  12/22/2017 0000    FUNGAL ORGANISMS PRESENT CONSISTENT WITH CANDIDA SPP.   HPVHIGH Negative 02/14/2022 0856   ADEQPAP  02/14/2022 0856    Satisfactory for evaluation; transformation zone component ABSENT.   ADEQPAP  12/22/2017 0000    Satisfactory for evaluation  endocervical/transformation zone component PRESENT.   PCP is Dr Sheryle    Current Medications, Allergies, Past Medical History, Past Surgical History, Family History and Social History were reviewed in [employer] electronic medical record.     Review of Systems: Patient denies any headaches, hearing loss, fatigue, blurred vision, shortness of breath, chest pain, abdominal pain, problems with bowel movements, urination, or intercourse.(Not active). No joint pain or mood swings.  Denies any vaginal bleeding    Physical Exam:BP 114/79 (BP Location: Left Arm, Patient Position: Sitting, Cuff Size: Normal)   Pulse 74   Ht 5' 6 (1.676 m)   Wt 144 lb (65.3 kg)   BMI 23.24 kg/m   General:  Well developed, well nourished, no acute distress Skin:  Warm and dry Neck:  Midline trachea, normal thyroid , good ROM, no lymphadenopathy, no carotid bruits heard  Lungs; Clear to auscultation bilaterally Breast:  No dominant palpable mass, retraction, or nipple discharge, has scab left from had skin cancer  removed Cardiovascular: Regular rate and rhythm Abdomen:  Soft, non tender, no hepatosplenomegaly Pelvic:  External genitalia is normal in appearance, no lesions.  The vagina is pale. Urethra has no lesions or masses. The cervix is smooth.   Uterus is felt to be normal size, shape, and contour.  No adnexal masses or tenderness noted.Bladder is non tender, no masses felt. Rectal: Good sphincter tone, no polyps, or hemorrhoids felt.  Hemoccult negative. Extremities/musculoskeletal:  No swelling or varicosities noted, no clubbing or cyanosis Psych:  No mood changes, alert and cooperative,seems happy AA is 1 Fall risk is low    04/23/2024   11:26 AM 04/18/2023   10:53 AM 02/14/2022    8:49 AM  Depression screen PHQ 2/9  Decreased Interest 0 0 0  Down, Depressed, Hopeless 0 0 0  PHQ - 2 Score 0 0 0  Altered sleeping 0 0 0  Tired, decreased energy 0 1 0  Change in appetite 0 0 0  Feeling bad or failure about yourself  0 0 0  Trouble concentrating 0 0 0  Moving slowly or fidgety/restless 0 0 0  Suicidal thoughts 0 0 0  PHQ-9 Score 0 1 0       04/23/2024   11:26 AM 04/18/2023   10:53 AM 02/14/2022    8:49 AM 12/27/2019    9:19 AM  GAD 7 : Generalized Anxiety Score  Nervous, Anxious, on Edge 0 0 0 0  Control/stop worrying 0 0 0 0  Worry too much - different things 0 0 0 0  Trouble relaxing 0 0 0 0  Restless 0 0 0 0  Easily annoyed or irritable 0 0 0 0  Afraid - awful might happen 0 0 0 0  Total GAD 7 Score 0 0 0 0  Anxiety Difficulty    Not difficult at all      Upstream - 04/23/24 1122       Pregnancy Intention Screening   Does the patient want to become pregnant in the next year? N/A    Does the patient's partner want to become pregnant in the next year? N/A    Would the patient like to discuss contraceptive options today? N/A      Contraception Wrap Up   Current Method Abstinence;No Method - Other Reason   PM   End Method Abstinence;No Method - Other Reason   PM    Contraception Counseling Provided No          Examination chaperoned by Clarita Salt LPN  Impression and plan: 1. Encounter for well woman exam with routine gynecological exam (Primary) Physical in 1 year Labs with PCP Mammogram was negative  12/13/23 Colonoscopy per GI   2. Encounter for screening fecal occult blood testing Hemoccult was negative   3. Postmenopause Denies any vaginal bleeding   4. Hormone replacement therapy (HRT) She still gets hot flashes and sweats, using 1 every 2 weeks now  She will try to wean off patch She is still taking zoloft  and is good on current dose  Meds ordered this encounter  Medications   estradiol -levonorgestrel  (CLIMARA  PRO) 0.045-0.015 MG/DAY    Sig: PLACE ONE PATCH ONTO THE SKIN ONCE A WEEK.    Dispense:  4 patch    Refill:  6    Supervising Provider:   JAYNE MINDER H [[id number]]   sertraline  (ZOLOFT ) 50 MG tablet    Sig: Take 1 tablet (50 mg total) by mouth daily.    Dispense:  90 tablet    Refill:  3    Supervising Provider:   JAYNE MINDER H [[id number]]

## 2024-04-25 ENCOUNTER — Other Ambulatory Visit (HOSPITAL_COMMUNITY): Payer: Self-pay | Admitting: Orthopedic Surgery

## 2024-04-25 ENCOUNTER — Encounter

## 2024-04-25 DIAGNOSIS — M546 Pain in thoracic spine: Secondary | ICD-10-CM

## 2024-04-26 ENCOUNTER — Encounter (HOSPITAL_COMMUNITY): Payer: Self-pay

## 2024-04-26 NOTE — Therapy (Signed)
 [facility] Outpatient Rehabilitation at [facility] [address] [place], [place], [address] Phone: [phone]   Fax:  [phone]  Patient Details  Name: Courtney Bradley MRN: [id number] Date of Birth: 1950-10-31 Referring Provider:  No ref. provider found  Encounter Date: 04/26/2024 PHYSICAL THERAPY DISCHARGE SUMMARY  Visits from Start of Care: 7  Current functional level related to goals / functional outcomes: See last note   Remaining deficits: See last note   Education / Equipment: HEP   Patient agrees to discharge. Patient goals were partially met. Patient is being discharged due to workers comp sending her back to work.   [facility] Outpatient Rehabilitation at [facility] [address] [place], [place], [address] Phone: [phone]   Fax:  [phone]

## 2024-04-29 ENCOUNTER — Ambulatory Visit (HOSPITAL_COMMUNITY)
Admission: RE | Admit: 2024-04-29 | Discharge: 2024-04-29 | Disposition: A | Discharge status: Home / Self Care | Source: Ambulatory Visit | Attending: Orthopedic Surgery | Admitting: Orthopedic Surgery

## 2024-04-29 DIAGNOSIS — M546 Pain in thoracic spine: Secondary | ICD-10-CM | POA: Diagnosis not present

## 2024-04-29 DIAGNOSIS — M4855XA Collapsed vertebra, not elsewhere classified, thoracolumbar region, initial encounter for fracture: Secondary | ICD-10-CM | POA: Diagnosis not present

## 2024-04-30 ENCOUNTER — Other Ambulatory Visit (HOSPITAL_COMMUNITY)

## 2024-04-30 ENCOUNTER — Encounter (HOSPITAL_COMMUNITY): Payer: Self-pay

## 2024-04-30 ENCOUNTER — Ambulatory Visit (HOSPITAL_COMMUNITY)

## 2024-05-01 ENCOUNTER — Ambulatory Visit (HOSPITAL_COMMUNITY)

## 2024-05-02 ENCOUNTER — Other Ambulatory Visit (HOSPITAL_COMMUNITY)

## 2024-05-03 ENCOUNTER — Ambulatory Visit: Admitting: Orthopedic Surgery

## 2024-05-08 ENCOUNTER — Encounter (HOSPITAL_COMMUNITY)

## 2024-05-14 ENCOUNTER — Ambulatory Visit (HOSPITAL_COMMUNITY): Attending: Orthopedic Surgery

## 2024-05-14 ENCOUNTER — Encounter (HOSPITAL_COMMUNITY)

## 2024-05-14 ENCOUNTER — Encounter (HOSPITAL_COMMUNITY): Payer: Self-pay

## 2024-05-14 ENCOUNTER — Other Ambulatory Visit: Payer: Self-pay

## 2024-05-14 DIAGNOSIS — S22080D Wedge compression fracture of T11-T12 vertebra, subsequent encounter for fracture with routine healing: Secondary | ICD-10-CM | POA: Diagnosis present

## 2024-05-14 DIAGNOSIS — M5386 Other specified dorsopathies, lumbar region: Secondary | ICD-10-CM | POA: Insufficient documentation

## 2024-05-14 DIAGNOSIS — Z7409 Other reduced mobility: Secondary | ICD-10-CM | POA: Diagnosis present

## 2024-05-14 DIAGNOSIS — M6281 Muscle weakness (generalized): Secondary | ICD-10-CM | POA: Insufficient documentation

## 2024-05-14 NOTE — Therapy (Signed)
 OUTPATIENT PHYSICAL THERAPY THORACOLUMBAR EVALUATION   Patient Name: Courtney Bradley MRN: [id number] DOB:October 26, 1950, 73 y.o., female Today's Date: 05/14/2024  END OF SESSION:  PT End of Session - 05/14/24 1148     Visit Number 1    Number of Visits 6    Date for Recertification  06/25/24    Authorization Type Worker's Comp; 10 PT visits approved with direct billing to the work comp carrier    Associate Professor - Visit Number 1    Authorization - Number of Visits 10    PT Start Time 1100    PT Stop Time 1145    PT Time Calculation (min) 45 min    Activity Tolerance Patient tolerated treatment well    Behavior During Therapy WFL for tasks assessed/performed          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   Basal cell carcinoma (BCC) in situ of skin 2025   on chest   Elevated cholesterol 11/28/2016   GERD (gastroesophageal reflux disease)    History of kidney stones    Hormone replacement therapy (HRT) 09/19/2014   Lymphadenopathy    Thoracic, followed by [facility]   Obesity    Osteopenia    Pneumonia    PONV (postoperative nausea and vomiting)    Sarcoidosis    T12 compression fracture (HCC) 12/21/2023   Past Surgical History:  Procedure Laterality Date   BIOPSY  04/09/2021   Procedure: BIOPSY;  Surgeon: Cindie Carlin POUR, DO;  [facility]: [facility];  Service: Endoscopy;;   BREAST CYST ASPIRATION     CHOLECYSTECTOMY     COLONOSCOPY N/A 11/17/2016   Procedure: COLONOSCOPY;  Surgeon: Claudis RAYMOND Rivet, MD;  Location: [facility];  Service: Endoscopy;  Laterality: N/A;  830   COLONOSCOPY WITH PROPOFOL  N/A 04/09/2021   Procedure: COLONOSCOPY WITH PROPOFOL ;  Surgeon: Cindie Carlin POUR, DO;  [facility]: [facility];  Service: Endoscopy;  Laterality: N/A;  8:30am   CORRECTION HAMMER TOE Bilateral    2nd   EYE SURGERY     GALLBLADDER SURGERY     LAPAROSCOPIC GASTRIC SLEEVE RESECTION WITH HIATAL HERNIA REPAIR N/A 03/14/2017   Procedure:  LAPAROSCOPIC GASTRIC SLEEVE RESECTION WITH HIATAL HERNIA REPAIR;  Surgeon: Tanda Locus, MD;  Location: [facility];  Service: General;  Laterality: N/A;   LITHOTRIPSY     neck lift     POLYPECTOMY  04/09/2021   Procedure: POLYPECTOMY INTESTINAL;  Surgeon: Cindie Carlin POUR, DO;  [facility]: [facility];  Service: Endoscopy;;   UPPER GI ENDOSCOPY  03/14/2017   Procedure: UPPER GI ENDOSCOPY;  Surgeon: Tanda Locus, MD;  Location: [facility];  Service: General;;   Patient Active Problem List   Diagnosis Date Noted   Elevated coronary artery calcium score 10/11/2023   Family history of premature CAD 10/11/2023   Encounter for screening fecal occult blood testing 04/18/2023   Encounter for gynecological examination with Papanicolaou smear of cervix 02/14/2022   Postmenopause 02/14/2022   Diarrhea 03/15/2021   Change in bowel function 03/15/2021   Sarcoidosis 10/09/2020   Erythrocytosis 01/22/2020   History of bariatric surgery 12/27/2019   Encounter for well woman exam with routine gynecological exam 12/27/2019   Gastroesophageal reflux disease 12/22/2017   Screening for colorectal cancer 12/22/2017   Encounter for counseling 12/22/2017   Morbid obesity (HCC) 03/14/2017   Osteopenia 03/14/2017   Dyslipidemia 03/14/2017   GERD (gastroesophageal reflux disease) 03/14/2017   Elevated cholesterol 11/28/2016  Special screening for malignant neoplasms, colon 08/11/2016   Well woman exam with routine gynecological exam 07/22/2016   Weight gain 07/22/2016   Hormone replacement therapy (HRT) 09/19/2014   Fatigue fracture of vertebra of sacral and sacrococcygeal region 01/26/2011   Disease of nail 06/12/2008   Closed fracture of sacrum and coccyx (HCC) 06/18/2007    PCP: Sheryle Carwin MD  REFERRING PROVIDER: Beuford Anes MD  REFERRING DIAG: LBP  REFERRAL INFO: 10 PT visits approved with direct billing to the work comp carrier; Client previously completed about 10 weeks of PT at [facility] and  she now needs strengthening and muscle re-education. Dx: T12, L1 compression fractures; HEALED PER MD based on MRI DATED 04/29/24  Rationale for Evaluation and Treatment: Rehabilitation  THERAPY DIAG:  Muscle weakness (generalized)  Decreased ROM of lumbar spine  Impaired functional mobility, balance, gait, and endurance  Compression fracture of T12 vertebra with routine healing, subsequent encounter  ONSET DATE: Last day of April 2025  SUBJECTIVE:                                                                                                                                                                                           SUBJECTIVE STATEMENT: Today, still not back to work because of workers comp restriction and 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 Moving around usually feels a little better. Best she feels is when she's completely off her feet.   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 Felt a pull/pop, went back to work. 12/29/23 - Urgent care secondary to pain; 01/10/24 - Evaluated by Orthopedics, with T12 compression fracture. Initially painful during walking, limited in ADL and avoided lifting secondary to pain. 02/01/24 - 04/10/24 Initial PT sessions. 03/12/24 - Stable T12 compression fracture noted. See back in 6 weeks. 05/06/24 - Saw Neuro who prescribed muscle relaxers and restarted PT with a follow up in 30 days.   PERTINENT HISTORY:  Osteopenia  PAIN:  Are you having pain? Yes: NPRS scale: 3 - 8 (sitting/standing long periods) - 0 (when sleeping doesn't wake her up) Pain location: lower thoracic all the way down lumbar Pain description: aching pain; better in the morning Aggravating factors: bending Relieving factors: resting and laying on back with knees up   PRECAUTIONS: Other: hx T12 compression fracture and  osteopenic  RED FLAGS: None   WEIGHT BEARING RESTRICTIONS: No  FALLS:  Has patient fallen in last 6 months? No  LIVING  ENVIRONMENT: Lives with: lives with their spouse Lives in: House/apartment Stairs: No Has following equipment at home: yoga mat, weights  OCCUPATION: Radiology tech at [employer]  PLOF: Independent and Vocation/Vocational requirements: push/pull; guarding patients, w/c transfers; lifting (~50#)   PATIENT GOALS: Continue to get stronger and back to where I was  NEXT MD VISIT: First week of October  OBJECTIVE:  Note: Objective measures were completed at Evaluation unless otherwise noted.  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  Otherwise normal  PATIENT SURVEYS:  Modified Oswestry:  MODIFIED OSWESTRY DISABILITY SCALE  Date: 05/14/24 Score  Total 24/50   Interpretation of scores: Score Category Description  0-20% Minimal Disability The patient can cope with most living activities. Usually no treatment is indicated apart from advice on lifting, sitting and exercise  21-40% Moderate Disability The patient experiences more pain and difficulty with sitting, lifting and standing. Travel and social life are more difficult and they may be disabled from work. Personal care, sexual activity and sleeping are not grossly affected, and the patient can usually be managed by conservative means  41-60% Severe Disability Pain remains the main problem in this group, but activities of daily living are affected. These patients require a detailed investigation  61-80% Crippled Back pain impinges on all aspects of the patient's life. Positive intervention is required  81-100% Bed-bound  These patients are either bed-bound or exaggerating their symptoms  Bluford FORBES Zoe DELENA Karon DELENA, et al. Surgery versus conservative management of stable thoracolumbar fracture: the PRESTO  feasibility RCT. [place]): [employer]; 2021 Nov. [facility] Technology Assessment, No. 25.62.) Appendix 3, Oswestry Disability Index category descriptors. Available from: FindJewel[employer].cz  Minimally Clinically Important Difference (MCID) = 12.8%  POSTURE: slightly sway back and intermittently corrects posture in sitting with consistent moving around likely secondary to discomfort  PALPATION: Tight paraspinals on tactile cuing noted (TL junction)  LUMBAR ROM:  (Measured within percentage of normative range of motion) ; asterick (*) indicates pain AROM eval  Flexion 60%   Extension 50% *  Right lateral flexion 60% *  Left lateral flexion 60% *  Right rotation 60%  Left rotation 65%   (Blank rows = not tested)  LOWER EXTREMITY ROM:     Active  Right eval Left eval  Hip flexion    Hip extension    Hip abduction    Hip adduction    Hip internal rotation    Hip external rotation    Knee flexion    Knee extension    Ankle dorsiflexion    Ankle plantarflexion    Ankle inversion    Ankle eversion     (Blank rows = not tested)  LOWER EXTREMITY MMT:    MMT Right eval Left eval  Hip flexion 4+ 4+  Hip extension    Hip abduction 4- 4-  Hip adduction    Hip internal rotation    Hip external rotation    Knee flexion 4+ 4  Knee extension 4+ 4+  Ankle dorsiflexion 4 4  Ankle plantarflexion 4 4  Ankle inversion    Ankle eversion     (Blank rows = not tested)  LUMBAR SPECIAL TESTS:  Prone instability test: Negative, Straight leg raise test: Negative, Slump test: Negative, and FABER test: Negative  FUNCTIONAL TESTS:  5 times sit to stand: 9.18s 30s STS - 15 reps without UE and slight increase in pain   SLS - (  L) 6.78s (R) 5.6s GAIT: Distance walked: not tested for distance but walked throughout exam Assistive device utilized: None Level of assistance: Complete Independence Comments: decreased thoracolumbar rotation noted  and decreased arm swing bilaterally too  CORE ENGAGEMENT  Decreased lumbopelvic dissociation coordination requiring max cues  Decreased core endurance/control as indicated by hip shift during SLR without core engagement noted  TREATMENT DATE:  05/14/24 Eval and Treat Education on HEP and progression with gentle mobility into bending/lifting/twisting as tolerated and within pain manageable ranges for improving pliability and progressions to ADL and RTW tasks Core engagement with daily activity education and cuing                                                                                                                                 PATIENT EDUCATION:  Education details: return to work task specific intervention plans, core engagement interventions and maintenance with gait and routine ADLs Person educated: Patient Education method: Medical illustrator Education comprehension: verbalized understanding and needs further education  HOME EXERCISE PROGRAM: Access Code: [email or web address] URL: [email or web address] Date: 05/14/2024 Prepared by: Lamarr Citrin  Exercises - Supine Lower Trunk Rotation  - 1 x daily - 7 x weekly - 2 sets - 10 reps - 3s hold - Supine Double Knee to Chest  - 1 x daily - 7 x weekly - 1 sets - 6 reps - 5s hold - Seated Pelvic Floor Elevators  - 4 x daily - 7 x weekly - 1 sets - 10 reps - 5s hold - Seated 3 Way Exercise Ball Roll Out Stretch  - 1 x daily - 7 x weekly - 1 sets - 10 reps - Swiss Ball March  - 1 x daily - 7 x weekly - 2 sets - 10 reps  ASSESSMENT:  CLINICAL IMPRESSION: Patient is a 73 y.o. female who was seen today for physical therapy evaluation and treatment for continued strengthening/ NMR post work accident subsequently resulting in T12 compression fracture on last week of April 2025. Pt continuing to be limited with with proper mechanics and painful bending, lifting, and performing ADLs around the home as noted in objective.  Pt also noted with decreased core / lumbopelvic stability and NM control/coordination, decreased BLE hip strength and balance deficits that are of concerns for increasing falls risk. Pt will benefit from skilled Physical Therapy services to address deficits/limitations in order to improve functional and QOL.   OBJECTIVE IMPAIRMENTS: Abnormal gait, decreased activity tolerance, decreased balance, decreased coordination, difficulty walking, decreased ROM, decreased strength, improper body mechanics, and pain.   ACTIVITY LIMITATIONS: carrying, lifting, bending, squatting, and stairs  PARTICIPATION LIMITATIONS: meal prep, laundry, driving, and occupation  PERSONAL FACTORS: Time since onset of injury/illness/exacerbation are also affecting patient's functional outcome.   REHAB POTENTIAL: Good  CLINICAL DECISION MAKING: Stable/uncomplicated  EVALUATION COMPLEXITY: Low  GOALS: Goals reviewed with patient? No  SHORT TERM GOALS: Target date: 05/26/24  Pt will be independent  with HEP in order to demonstrate participation in Physical Therapy POC.  Baseline: Goal status: INITIAL  2.  Pt will report <3/10 pain with mobility in order to demonstrate improved pain with ADLs.  Baseline:  Goal status: INITIAL  LONG TERM GOALS: Target date: 06/25/24  Pt will improve 30s STS test by 5 repetitions without increasing pain in order to demonstrate improved functional strength to return to desired activities.  Baseline: see objective.  Goal status: INITIAL  3.  Pt will improve Modified Oswestry score by 13% in order to demonstrate improved pain with functional goals and outcomes. Baseline: see objective.  Goal status: INITIAL  4.  Pt will report <2/10 pain with mobility including return to work activities in order to demonstrate reduced pain with ADLs lasting greater than 30 minutes.  Baseline: see objective.  Goal status: INITIAL   5.  Pt will demonstrate lifting 10# from floor without increasing  pain and with good mechanics as well as push/pulling 20# x 10' on 2 repetitions in order to indicate improved tolerance for return to work tasks.  Baseline: see objective.  Goal status: INITIAL   6.  Pt will demonstrate improved SLS balance to at least 12s on each side in order to indicate improved functional balance needed for reduced fall risk and improved functional balance.  Baseline: see objective.  Goal status: INITIAL    PLAN:  PT FREQUENCY: 1-2x/week  PT DURATION: 6 weeks  PLANNED INTERVENTIONS: [id number]- PT Re-evaluation, 97110-Therapeutic exercises, 97530- Therapeutic activity, [id number]- Neuromuscular re-education, 97535- Self Care, [id number]- Manual therapy, [phone]- Gait training, [phone]- Orthotic Initial, [phone]- Electrical stimulation (unattended), Patient/Family education, Balance training, and Stair training.  PLAN FOR NEXT SESSION: Continued core engagement/lumbopelvic dissociation interventions with hip hinge emphasis; carrying/lifting and push/pull mechanics needed for RTW  Lamarr LITTIE Citrin PT, DPT [facility] [place] [phone] office   Lamarr LITTIE Citrin, PT 05/14/2024, 12:07 PM

## 2024-05-21 ENCOUNTER — Encounter (HOSPITAL_COMMUNITY)

## 2024-05-22 ENCOUNTER — Encounter (HOSPITAL_COMMUNITY): Payer: Self-pay

## 2024-05-22 ENCOUNTER — Encounter (HOSPITAL_COMMUNITY)

## 2024-05-29 ENCOUNTER — Encounter (HOSPITAL_COMMUNITY): Payer: Self-pay

## 2024-05-29 ENCOUNTER — Encounter (HOSPITAL_COMMUNITY): Admitting: Physical Therapy

## 2024-05-29 ENCOUNTER — Ambulatory Visit (HOSPITAL_COMMUNITY): Attending: Orthopedic Surgery

## 2024-05-29 DIAGNOSIS — S22080D Wedge compression fracture of T11-T12 vertebra, subsequent encounter for fracture with routine healing: Secondary | ICD-10-CM | POA: Diagnosis present

## 2024-05-29 DIAGNOSIS — M6281 Muscle weakness (generalized): Secondary | ICD-10-CM | POA: Diagnosis present

## 2024-05-29 DIAGNOSIS — Z7409 Other reduced mobility: Secondary | ICD-10-CM | POA: Insufficient documentation

## 2024-05-29 DIAGNOSIS — D750 Familial erythrocytosis: Secondary | ICD-10-CM | POA: Diagnosis not present

## 2024-05-29 DIAGNOSIS — D869 Sarcoidosis, unspecified: Secondary | ICD-10-CM | POA: Diagnosis not present

## 2024-05-29 DIAGNOSIS — K219 Gastro-esophageal reflux disease without esophagitis: Secondary | ICD-10-CM | POA: Diagnosis not present

## 2024-05-29 DIAGNOSIS — E785 Hyperlipidemia, unspecified: Secondary | ICD-10-CM | POA: Diagnosis not present

## 2024-05-29 DIAGNOSIS — Z79899 Other long term (current) drug therapy: Secondary | ICD-10-CM | POA: Diagnosis not present

## 2024-05-29 DIAGNOSIS — M5386 Other specified dorsopathies, lumbar region: Secondary | ICD-10-CM | POA: Diagnosis present

## 2024-05-29 DIAGNOSIS — N951 Menopausal and female climacteric states: Secondary | ICD-10-CM | POA: Diagnosis not present

## 2024-05-29 NOTE — Therapy (Signed)
 OUTPATIENT PHYSICAL THERAPY THORACOLUMBAR TREATMENT   Patient Name: Courtney Bradley MRN: [id number] DOB:December 12, 1950, 73 y.o., female Today's Date: 05/29/2024  END OF SESSION:  PT End of Session - 05/29/24 0946     Visit Number 2    Number of Visits 6    Date for Recertification  06/25/24    Authorization Type Worker's Comp; 10 PT visits approved with direct billing to the work comp carrier    Authorization - Visit Number 2    Authorization - Number of Visits 10    Progress Note Due on Visit 7    PT Start Time [phone]    PT Stop Time 1028    PT Time Calculation (min) 40 min    Activity Tolerance Patient tolerated treatment well    Behavior During Therapy WFL for tasks assessed/performed          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   Basal cell carcinoma (BCC) in situ of skin 2025   on chest   Elevated cholesterol 11/28/2016   GERD (gastroesophageal reflux disease)    History of kidney stones    Hormone replacement therapy (HRT) 09/19/2014   Lymphadenopathy    Thoracic, followed by [facility]   Obesity    Osteopenia    Pneumonia    PONV (postoperative nausea and vomiting)    Sarcoidosis    T12 compression fracture (HCC) 12/21/2023   Past Surgical History:  Procedure Laterality Date   BIOPSY  04/09/2021   Procedure: BIOPSY;  Surgeon: Cindie Carlin POUR, DO;  [facility]: [facility];  Service: Endoscopy;;   BREAST CYST ASPIRATION     CHOLECYSTECTOMY     COLONOSCOPY N/A 11/17/2016   Procedure: COLONOSCOPY;  Surgeon: Claudis RAYMOND Rivet, MD;  Location: [facility];  Service: Endoscopy;  Laterality: N/A;  830   COLONOSCOPY WITH PROPOFOL  N/A 04/09/2021   Procedure: COLONOSCOPY WITH PROPOFOL ;  Surgeon: Cindie Carlin POUR, DO;  Location: [facility];  Service: Endoscopy;  Laterality: N/A;  8:30am   CORRECTION HAMMER TOE Bilateral    2nd   EYE SURGERY     GALLBLADDER SURGERY     LAPAROSCOPIC GASTRIC SLEEVE RESECTION WITH HIATAL HERNIA REPAIR N/A  03/14/2017   Procedure: LAPAROSCOPIC GASTRIC SLEEVE RESECTION WITH HIATAL HERNIA REPAIR;  Surgeon: Tanda Locus, MD;  Location: [facility];  Service: General;  Laterality: N/A;   LITHOTRIPSY     neck lift     POLYPECTOMY  04/09/2021   Procedure: POLYPECTOMY INTESTINAL;  Surgeon: Cindie Carlin POUR, DO;  [facility]: [facility];  Service: Endoscopy;;   UPPER GI ENDOSCOPY  03/14/2017   Procedure: UPPER GI ENDOSCOPY;  Surgeon: Tanda Locus, MD;  Location: [facility];  Service: General;;   Patient Active Problem List   Diagnosis Date Noted   Elevated coronary artery calcium score 10/11/2023   Family history of premature CAD 10/11/2023   Encounter for screening fecal occult blood testing 04/18/2023   Encounter for gynecological examination with Papanicolaou smear of cervix 02/14/2022   Postmenopause 02/14/2022   Diarrhea 03/15/2021   Change in bowel function 03/15/2021   Sarcoidosis 10/09/2020   Erythrocytosis 01/22/2020   History of bariatric surgery 12/27/2019   Encounter for well woman exam with routine gynecological exam 12/27/2019   Gastroesophageal reflux disease 12/22/2017   Screening for colorectal cancer 12/22/2017   Encounter for counseling 12/22/2017   Morbid obesity (HCC) 03/14/2017   Osteopenia 03/14/2017   Dyslipidemia 03/14/2017   GERD (  gastroesophageal reflux disease) 03/14/2017   Elevated cholesterol 11/28/2016   Special screening for malignant neoplasms, colon 08/11/2016   Well woman exam with routine gynecological exam 07/22/2016   Weight gain 07/22/2016   Hormone replacement therapy (HRT) 09/19/2014   Fatigue fracture of vertebra of sacral and sacrococcygeal region 01/26/2011   Disease of nail 06/12/2008   Closed fracture of sacrum and coccyx (HCC) 06/18/2007    PCP: Sheryle Carwin MD  REFERRING PROVIDER: Beuford Anes MD  REFERRING DIAG: LBP  REFERRAL INFO: 10 PT visits approved with direct billing to the work comp carrier; Client previously completed about 10 weeks  of PT at [facility] and she now needs strengthening and muscle re-education. Dx: T12, L1 compression fractures; HEALED PER MD based on MRI DATED 04/29/24  Rationale for Evaluation and Treatment: Rehabilitation  THERAPY DIAG:  Muscle weakness (generalized)  Decreased ROM of lumbar spine  Impaired functional mobility, balance, gait, and endurance  Compression fracture of T12 vertebra with routine healing, subsequent encounter  ONSET DATE: Last day of April 2025  SUBJECTIVE:                                                                                                                                                                                           SUBJECTIVE STATEMENT: 05/29/24:  Reports new muscle relaxor medication.  Current pain scale 3/10 lower back achy pain.  Reports she has been compliant with HEP except has not purchased a theraball yet.  No questions with current exercise program.  Reports pain with prolonged sitting or standing for long periods of time.  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 Moving around usually feels a little better. Best she feels is when she's completely off her feet.   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 Felt a pull/pop, went back to work. 12/29/23 - Urgent care secondary to pain; 01/10/24 - Evaluated by Orthopedics, with T12 compression fracture. Initially painful during walking, limited in ADL and avoided lifting secondary to pain. 02/01/24 - 04/10/24 Initial PT sessions. 03/12/24 - Stable T12 compression fracture noted. See back in 6 weeks. 05/06/24 - Saw Neuro who prescribed muscle relaxers and restarted PT with a follow up in 30 days.   PERTINENT HISTORY:  Osteopenia  PAIN:   Are you having pain? Yes: NPRS scale: 3 - 8 (  sitting/standing long periods) - 0 (when sleeping doesn't wake her up) Pain location: lower thoracic all the way down lumbar Pain description: aching pain; better in the morning Aggravating factors: bending Relieving factors: resting and laying on back with knees up   PRECAUTIONS: Other: hx T12 compression fracture and osteopenic  RED FLAGS: None   WEIGHT BEARING RESTRICTIONS: No  FALLS:  Has patient fallen in last 6 months? No  LIVING ENVIRONMENT: Lives with: lives with their spouse Lives in: House/apartment Stairs: No Has following equipment at home: yoga mat, weights  OCCUPATION: Radiology tech at [employer]  PLOF: Independent and Vocation/Vocational requirements: push/pull; guarding patients, w/c transfers; lifting (~50#)   PATIENT GOALS: Continue to get stronger and back to where I was  NEXT MD VISIT: First week of October  OBJECTIVE:  Note: Objective measures were completed at Evaluation unless otherwise noted.  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  Otherwise normal  PATIENT SURVEYS:  Modified Oswestry:  MODIFIED OSWESTRY DISABILITY SCALE  Date: 05/14/24 Score  Total 24/50   Interpretation of scores: Score Category Description  0-20% Minimal Disability The patient can cope with most living activities. Usually no treatment is indicated apart from advice on lifting, sitting and exercise  21-40% Moderate Disability The patient experiences more pain and difficulty with sitting, lifting and standing. Travel and social life are more difficult and they may be disabled from work. Personal care, sexual activity and sleeping are not grossly affected, and the patient can usually be managed by conservative means  41-60% Severe Disability Pain remains the main problem in this group, but activities of daily living are  affected. These patients require a detailed investigation  61-80% Crippled Back pain impinges on all aspects of the patient's life. Positive intervention is required  81-100% Bed-bound  These patients are either bed-bound or exaggerating their symptoms  Bluford FORBES Zoe DELENA Karon DELENA, et al. Surgery versus conservative management of stable thoracolumbar fracture: the PRESTO feasibility RCT. [place] ([place]): [employer]; 2021 Nov. [place] [facility] Technology Assessment, No. 25.62.) Appendix 3, Oswestry Disability Index category descriptors. Available from: FindJewel[employer].cz  Minimally Clinically Important Difference (MCID) = 12.8%  POSTURE: slightly sway back and intermittently corrects posture in sitting with consistent moving around likely secondary to discomfort  PALPATION: Tight paraspinals on tactile cuing noted (TL junction)  LUMBAR ROM:  (Measured within percentage of normative range of motion) ; asterick (*) indicates pain AROM eval  Flexion 60%   Extension 50% *  Right lateral flexion 60% *  Left lateral flexion 60% *  Right rotation 60%  Left rotation 65%   (Blank rows = not tested)  LOWER EXTREMITY ROM:     Active  Right eval Left eval  Hip flexion    Hip extension    Hip abduction    Hip adduction    Hip internal rotation    Hip external rotation    Knee flexion    Knee extension    Ankle dorsiflexion    Ankle plantarflexion    Ankle inversion    Ankle eversion     (Blank rows = not tested)  LOWER EXTREMITY MMT:    MMT Right eval Left eval  Hip flexion 4+ 4+  Hip extension    Hip abduction 4- 4-  Hip adduction    Hip internal rotation    Hip external rotation    Knee flexion 4+ 4  Knee extension 4+  4+  Ankle dorsiflexion 4 4  Ankle plantarflexion 4 4  Ankle inversion    Ankle eversion     (Blank rows = not tested)  LUMBAR SPECIAL TESTS:  Prone instability test: Negative, Straight leg raise test: Negative, Slump  test: Negative, and FABER test: Negative  FUNCTIONAL TESTS:  5 times sit to stand: 9.18s 30s STS - 15 reps without UE and slight increase in pain   SLS - (L) 6.78s (R) 5.6s GAIT: Distance walked: not tested for distance but walked throughout exam Assistive device utilized: None Level of assistance: Complete Independence Comments: decreased thoracolumbar rotation noted and decreased arm swing bilaterally too  CORE ENGAGEMENT  Decreased lumbopelvic dissociation coordination requiring max cues  Decreased core endurance/control as indicated by hip shift during SLR without core engagement noted  TREATMENT DATE:  05/29/24: Reviewed goals  Educated importance Supine:   - Supine Lower Trunk Rotation 5x 10 - Supine Double Knee to Chest  - 1 x daily - 7 x weekly - 1 sets - 6 reps - 5s hold - Supine Pelvic Floor Elevators  10x 5 - Bridge 10x 5 Seated: - Seated 3 Way Exercise [employer] Stretch 5x each directions - Swiss Ball March  -10x with ab set, therapist supporting ball  Standing:  - Hip hinge with dowel 2 x10  05/14/24 Eval and Treat Education on HEP and progression with gentle mobility into bending/lifting/twisting as tolerated and within pain manageable ranges for improving pliability and progressions to ADL and RTW tasks Core engagement with daily activity education and cuing                                                                                                                                 PATIENT EDUCATION:  Education details: return to work task specific intervention plans, core engagement interventions and maintenance with gait and routine ADLs Person educated: Patient Education method: Medical illustrator Education comprehension: verbalized understanding and needs further education  HOME EXERCISE PROGRAM: Access Code: [email or web address] URL: [email or web address] Date: 05/14/2024 Prepared by: Lamarr Citrin  Exercises - Supine Lower  Trunk Rotation  - 1 x daily - 7 x weekly - 2 sets - 10 reps - 3s hold - Supine Double Knee to Chest  - 1 x daily - 7 x weekly - 1 sets - 6 reps - 5s hold - Seated Pelvic Floor Elevators  - 4 x daily - 7 x weekly - 1 sets - 10 reps - 5s hold - Seated 3 Way Exercise Ball Roll Out Stretch  - 1 x daily - 7 x weekly - 1 sets - 10 reps - Swiss Ball March  - 1 x daily - 7 x weekly - 2 sets - 10 reps  ASSESSMENT:  CLINICAL IMPRESSION: 05/29/24:  Reviewed goals and current HEP compliance, pt able to recall and demonstrate appropriate mechanics with all exercises, has not purchased theraball for home use yet.  Session focus  with core and proximal strengthening and additional functional strengthening mechanics.  Educated mechanics with dead lifting to improve hip hinge and reduce lumbar flexion with lifting.  No reports of pain through session.  Added bridges and hip hinge squat mechanics to HEP with printout given and verbalized understanding.    Eval:  Patient is a 73 y.o. female who was seen today for physical therapy evaluation and treatment for continued strengthening/ NMR post work accident subsequently resulting in T12 compression fracture on last week of April 2025. Pt continuing to be limited with with proper mechanics and painful bending, lifting, and performing ADLs around the home as noted in objective. Pt also noted with decreased core / lumbopelvic stability and NM control/coordination, decreased BLE hip strength and balance deficits that are of concerns for increasing falls risk. Pt will benefit from skilled Physical Therapy services to address deficits/limitations in order to improve functional and QOL.   OBJECTIVE IMPAIRMENTS: Abnormal gait, decreased activity tolerance, decreased balance, decreased coordination, difficulty walking, decreased ROM, decreased strength, improper body mechanics, and pain.   ACTIVITY LIMITATIONS: carrying, lifting, bending, squatting, and stairs  PARTICIPATION  LIMITATIONS: meal prep, laundry, driving, and occupation  PERSONAL FACTORS: Time since onset of injury/illness/exacerbation are also affecting patient's functional outcome.   REHAB POTENTIAL: Good  CLINICAL DECISION MAKING: Stable/uncomplicated  EVALUATION COMPLEXITY: Low  GOALS: Goals reviewed with patient? No  SHORT TERM GOALS: Target date: 05/26/24  Pt will be independent with HEP in order to demonstrate participation in Physical Therapy POC.  Baseline: Goal status: INITIAL  2.  Pt will report <3/10 pain with mobility in order to demonstrate improved pain with ADLs.  Baseline:  Goal status: INITIAL  LONG TERM GOALS: Target date: 06/25/24  Pt will improve 30s STS test by 5 repetitions without increasing pain in order to demonstrate improved functional strength to return to desired activities.  Baseline: see objective.  Goal status: INITIAL  3.  Pt will improve Modified Oswestry score by 13% in order to demonstrate improved pain with functional goals and outcomes. Baseline: see objective.  Goal status: INITIAL  4.  Pt will report <2/10 pain with mobility including return to work activities in order to demonstrate reduced pain with ADLs lasting greater than 30 minutes.  Baseline: see objective.  Goal status: INITIAL   5.  Pt will demonstrate lifting 10# from floor without increasing pain and with good mechanics as well as push/pulling 20# x 10' on 2 repetitions in order to indicate improved tolerance for return to work tasks.  Baseline: see objective.  Goal status: INITIAL   6.  Pt will demonstrate improved SLS balance to at least 12s on each side in order to indicate improved functional balance needed for reduced fall risk and improved functional balance.  Baseline: see objective.  Goal status: INITIAL    PLAN:  PT FREQUENCY: 1-2x/week  PT DURATION: 6 weeks  PLANNED INTERVENTIONS: [id number]- PT Re-evaluation, 97110-Therapeutic exercises, 97530- Therapeutic activity,  [id number]- Neuromuscular re-education, 97535- Self Care, [id number]- Manual therapy, [phone]- Gait training, [phone]- Orthotic Initial, [phone]- Electrical stimulation (unattended), Patient/Family education, Balance training, and Stair training.  PLAN FOR NEXT SESSION: Continued core engagement/lumbopelvic dissociation interventions with hip hinge emphasis; carrying/lifting and push/pull mechanics needed for RTW  Augustin Mclean, LPTA/CLT; CBIS [phone]  Mclean Augustin Amble, PTA 05/29/2024, 12:45 PM

## 2024-06-06 ENCOUNTER — Encounter (HOSPITAL_COMMUNITY)

## 2024-06-07 ENCOUNTER — Ambulatory Visit (HOSPITAL_COMMUNITY)

## 2024-06-12 ENCOUNTER — Ambulatory Visit (HOSPITAL_COMMUNITY): Admitting: Physical Therapy

## 2024-06-12 DIAGNOSIS — S22080D Wedge compression fracture of T11-T12 vertebra, subsequent encounter for fracture with routine healing: Secondary | ICD-10-CM

## 2024-06-12 DIAGNOSIS — M5386 Other specified dorsopathies, lumbar region: Secondary | ICD-10-CM

## 2024-06-12 DIAGNOSIS — M6281 Muscle weakness (generalized): Secondary | ICD-10-CM | POA: Diagnosis not present

## 2024-06-12 DIAGNOSIS — Z7409 Other reduced mobility: Secondary | ICD-10-CM

## 2024-06-12 NOTE — Therapy (Signed)
 OUTPATIENT PHYSICAL THERAPY THORACOLUMBAR TREATMENT   Patient Name: Courtney Bradley MRN: [id number] DOB:09/13/1950, 73 y.o., female Today's Date: 06/12/2024  END OF SESSION:  PT End of Session - 06/12/24 0829     Visit Number 3    Number of Visits 6    Date for Recertification  06/25/24    Authorization Type Worker's Comp; 10 PT visits approved with direct billing to the work comp carrier    Associate Professor - Visit Number 3    Authorization - Number of Visits 10    Progress Note Due on Visit 7    PT Start Time 0818    PT Stop Time 0900    PT Time Calculation (min) 42 min    Activity Tolerance Patient tolerated treatment well    Behavior During Therapy WFL for tasks assessed/performed           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   Basal cell carcinoma (BCC) in situ of skin 2025   on chest   Elevated cholesterol 11/28/2016   GERD (gastroesophageal reflux disease)    History of kidney stones    Hormone replacement therapy (HRT) 09/19/2014   Lymphadenopathy    Thoracic, followed by [facility]   Obesity    Osteopenia    Pneumonia    PONV (postoperative nausea and vomiting)    Sarcoidosis    T12 compression fracture (HCC) 12/21/2023   Past Surgical History:  Procedure Laterality Date   BIOPSY  04/09/2021   Procedure: BIOPSY;  Surgeon: Cindie Carlin POUR, DO;  [facility];  Service: Endoscopy;;   BREAST CYST ASPIRATION     CHOLECYSTECTOMY     COLONOSCOPY N/A 11/17/2016   Procedure: COLONOSCOPY;  Surgeon: Claudis RAYMOND Rivet, MD;  Location: [facility];  Service: Endoscopy;  Laterality: N/A;  830   COLONOSCOPY WITH PROPOFOL  N/A 04/09/2021   Procedure: COLONOSCOPY WITH PROPOFOL ;  Surgeon: Cindie Carlin POUR, DO;  [facility]: [facility];  Service: Endoscopy;  Laterality: N/A;  8:30am   CORRECTION HAMMER TOE Bilateral    2nd   EYE SURGERY     GALLBLADDER SURGERY     LAPAROSCOPIC GASTRIC SLEEVE RESECTION WITH HIATAL HERNIA REPAIR  N/A 03/14/2017   Procedure: LAPAROSCOPIC GASTRIC SLEEVE RESECTION WITH HIATAL HERNIA REPAIR;  Surgeon: Tanda Locus, MD;  Location: [facility];  Service: General;  Laterality: N/A;   LITHOTRIPSY     neck lift     POLYPECTOMY  04/09/2021   Procedure: POLYPECTOMY INTESTINAL;  Surgeon: Cindie Carlin POUR, DO;  [facility]: [facility];  Service: Endoscopy;;   UPPER GI ENDOSCOPY  03/14/2017   Procedure: UPPER GI ENDOSCOPY;  Surgeon: Tanda Locus, MD;  Location: [facility];  Service: General;;   Patient Active Problem List   Diagnosis Date Noted   Elevated coronary artery calcium score 10/11/2023   Family history of premature CAD 10/11/2023   Encounter for screening fecal occult blood testing 04/18/2023   Encounter for gynecological examination with Papanicolaou smear of cervix 02/14/2022   Postmenopause 02/14/2022   Diarrhea 03/15/2021   Change in bowel function 03/15/2021   Sarcoidosis 10/09/2020   Erythrocytosis 01/22/2020   History of bariatric surgery 12/27/2019   Encounter for well woman exam with routine gynecological exam 12/27/2019   Gastroesophageal reflux disease 12/22/2017   Screening for colorectal cancer 12/22/2017   Encounter for counseling 12/22/2017   Morbid obesity (HCC) 03/14/2017   Osteopenia 03/14/2017   Dyslipidemia 03/14/2017  GERD (gastroesophageal reflux disease) 03/14/2017   Elevated cholesterol 11/28/2016   Special screening for malignant neoplasms, colon 08/11/2016   Well woman exam with routine gynecological exam 07/22/2016   Weight gain 07/22/2016   Hormone replacement therapy (HRT) 09/19/2014   Fatigue fracture of vertebra of sacral and sacrococcygeal region 01/26/2011   Disease of nail 06/12/2008   Closed fracture of sacrum and coccyx (HCC) 06/18/2007    PCP: Sheryle Carwin MD  REFERRING PROVIDER: Beuford Anes MD  REFERRING DIAG: LBP  REFERRAL INFO: 10 PT visits approved with direct billing to the work comp carrier; Client previously completed about 10  weeks of PT at [facility] and she now needs strengthening and muscle re-education. Dx: T12, L1 compression fractures; HEALED PER MD based on MRI DATED 04/29/24  Rationale for Evaluation and Treatment: Rehabilitation  THERAPY DIAG:  Muscle weakness (generalized)  Decreased ROM of lumbar spine  Impaired functional mobility, balance, gait, and endurance  Compression fracture of T12 vertebra with routine healing, subsequent encounter  ONSET DATE: Last day of April 2025  SUBJECTIVE:                                                                                                                                                                                           SUBJECTIVE STATEMENT: 06/12/24:  Current pain remains unchanged around 3/10 lower back.  Still has not bought a ball, doing the other exercises and walking. States she is suppose to be getting scheduled for an FCE.  Still unable to go back to work.  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 Moving around usually feels a little better. Best she feels is when she's completely off her feet.   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 Felt a pull/pop, went back to work. 12/29/23 - Urgent care secondary to pain; 01/10/24 - Evaluated by Orthopedics, with T12 compression fracture. Initially painful during walking, limited in ADL and avoided lifting secondary to pain. 02/01/24 - 04/10/24 Initial PT sessions. 03/12/24 - Stable T12 compression fracture noted. See back in 6 weeks. 05/06/24 - Saw Neuro who prescribed muscle relaxers and restarted PT with a follow up in 30 days.   PERTINENT HISTORY:  Osteopenia  PAIN:  Are you having pain? Yes: NPRS scale: 3 - 8 (sitting/standing long periods) - 0 (when sleeping doesn't wake  her up) Pain location: lower thoracic all the  way down lumbar Pain description: aching pain; better in the morning Aggravating factors: bending Relieving factors: resting and laying on back with knees up   PRECAUTIONS: Other: hx T12 compression fracture and osteopenic  RED FLAGS: None   WEIGHT BEARING RESTRICTIONS: No  FALLS:  Has patient fallen in last 6 months? No  LIVING ENVIRONMENT: Lives with: lives with their spouse Lives in: House/apartment Stairs: No Has following equipment at home: yoga mat, weights  OCCUPATION: Radiology tech at [employer]  PLOF: Independent and Vocation/Vocational requirements: push/pull; guarding patients, w/c transfers; lifting (~50#)   PATIENT GOALS: Continue to get stronger and back to where I was  NEXT MD VISIT: First week of October  OBJECTIVE:  Note: Objective measures were completed at Evaluation unless otherwise noted.  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  Otherwise normal  PATIENT SURVEYS:  Modified Oswestry:  MODIFIED OSWESTRY DISABILITY SCALE  Date: 05/14/24 Score  Total 24/50   Interpretation of scores: Score Category Description  0-20% Minimal Disability The patient can cope with most living activities. Usually no treatment is indicated apart from advice on lifting, sitting and exercise  21-40% Moderate Disability The patient experiences more pain and difficulty with sitting, lifting and standing. Travel and social life are more difficult and they may be disabled from work. Personal care, sexual activity and sleeping are not grossly affected, and the patient can usually be managed by conservative means  41-60% Severe Disability Pain remains the main problem in this group, but activities of daily living are affected. These patients require a detailed investigation  61-80% Crippled Back pain impinges on all aspects of the  patient's life. Positive intervention is required  81-100% Bed-bound  These patients are either bed-bound or exaggerating their symptoms  Bluford FORBES Zoe DELENA Karon DELENA, et al. Surgery versus conservative management of stable thoracolumbar fracture: the PRESTO feasibility RCT. [place] ([place]): [employer]; 2021 Nov. [facility] Technology Assessment, No. 25.62.) Appendix 3, Oswestry Disability Index category descriptors. Available from: FindJewel[employer].cz  Minimally Clinically Important Difference (MCID) = 12.8%  POSTURE: slightly sway back and intermittently corrects posture in sitting with consistent moving around likely secondary to discomfort  PALPATION: Tight paraspinals on tactile cuing noted (TL junction)  LUMBAR ROM:  (Measured within percentage of normative range of motion) ; asterick (*) indicates pain AROM eval  Flexion 60%   Extension 50% *  Right lateral flexion 60% *  Left lateral flexion 60% *  Right rotation 60%  Left rotation 65%   (Blank rows = not tested)  LOWER EXTREMITY ROM:     Active  Right eval Left eval  Hip flexion    Hip extension    Hip abduction    Hip adduction    Hip internal rotation    Hip external rotation    Knee flexion    Knee extension    Ankle dorsiflexion    Ankle plantarflexion    Ankle inversion    Ankle eversion     (Blank rows = not tested)  LOWER EXTREMITY MMT:    MMT Right eval Left eval  Hip flexion 4+ 4+  Hip extension    Hip abduction 4- 4-  Hip adduction    Hip internal rotation    Hip external rotation    Knee flexion 4+ 4  Knee extension 4+ 4+  Ankle dorsiflexion 4 4  Ankle plantarflexion 4 4  Ankle inversion  Ankle eversion     (Blank rows = not tested)  LUMBAR SPECIAL TESTS:  Prone instability test: Negative, Straight leg raise test: Negative, Slump test: Negative, and FABER test: Negative  FUNCTIONAL TESTS:  5 times sit to stand: 9.18s 30s STS - 15 reps without  UE and slight increase in pain   SLS - (L) 6.78s (R) 5.6s GAIT: Distance walked: not tested for distance but walked throughout exam Assistive device utilized: None Level of assistance: Complete Independence Comments: decreased thoracolumbar rotation noted and decreased arm swing bilaterally too  CORE ENGAGEMENT  Decreased lumbopelvic dissociation coordination requiring max cues  Decreased core endurance/control as indicated by hip shift during SLR without core engagement noted  TREATMENT DATE:  06/12/24 POC discussion/activities/body mechanics  Supine:  Bridge 10X5 Green physioball bridge 10X5 Green physioball roll outs for hamstring and core 10X Seated:  Green physioball marching    05/29/24: Reviewed goals  Educated importance Supine:   - Supine Lower Trunk Rotation 5x 10 - Supine Double Knee to Chest  - 1 x daily - 7 x weekly - 1 sets - 6 reps - 5s hold - Supine Pelvic Floor Elevators  10x 5 - Bridge 10x 5 Seated: - Seated 3 Way Exercise [employer] Stretch 5x each directions - Swiss Ball March  -10x with ab set, therapist supporting ball  Standing:  - Hip hinge with dowel 2 x10  05/14/24 Eval and Treat Education on HEP and progression with gentle mobility into bending/lifting/twisting as tolerated and within pain manageable ranges for improving pliability and progressions to ADL and RTW tasks Core engagement with daily activity education and cuing                                                                                                                                 PATIENT EDUCATION:  Education details: return to work task specific intervention plans, core engagement interventions and maintenance with gait and routine ADLs Person educated: Patient Education method: Medical illustrator Education comprehension: verbalized understanding and needs further education  HOME EXERCISE PROGRAM: Access Code: [email or web address] URL:  [email or web address] Date: 05/14/2024 Prepared by: Lamarr Citrin  Exercises - Supine Lower Trunk Rotation  - 1 x daily - 7 x weekly - 2 sets - 10 reps - 3s hold - Supine Double Knee to Chest  - 1 x daily - 7 x weekly - 1 sets - 6 reps - 5s hold - Seated Pelvic Floor Elevators  - 4 x daily - 7 x weekly - 1 sets - 10 reps - 5s hold - Seated 3 Way Exercise Ball Roll Out Stretch  - 1 x daily - 7 x weekly - 1 sets - 10 reps - Swiss Ball March  - 1 x daily - 7 x weekly - 2 sets - 10 reps  ASSESSMENT:  CLINICAL IMPRESSION: 06/12/24:  Reviewed current state with return to work/ADL's and completion of HEP.  Pt is completing  activities/HEP, however still has not purchased physioball so focused on these today.  Increased difficulty of bridge using ball to further activate core mm.  Noted challenge with this.  Hamstring curls/roll out further activated core mm with challenge.  PT was able to complete these without pain or issues today.  Stabilization improved with repetitions and able to hold 5-6 in position without trembling. Overall , good mechanics with all exercises.  No new exercises given for HEP this session.    Eval:  Patient is a 73 y.o. female who was seen today for physical therapy evaluation and treatment for continued strengthening/ NMR post work accident subsequently resulting in T12 compression fracture on last week of April 2025. Pt continuing to be limited with with proper mechanics and painful bending, lifting, and performing ADLs around the home as noted in objective. Pt also noted with decreased core / lumbopelvic stability and NM control/coordination, decreased BLE hip strength and balance deficits that are of concerns for increasing falls risk. Pt will benefit from skilled Physical Therapy services to address deficits/limitations in order to improve functional and QOL.   OBJECTIVE IMPAIRMENTS: Abnormal gait, decreased activity tolerance, decreased balance, decreased  coordination, difficulty walking, decreased ROM, decreased strength, improper body mechanics, and pain.   ACTIVITY LIMITATIONS: carrying, lifting, bending, squatting, and stairs  PARTICIPATION LIMITATIONS: meal prep, laundry, driving, and occupation  PERSONAL FACTORS: Time since onset of injury/illness/exacerbation are also affecting patient's functional outcome.   REHAB POTENTIAL: Good  CLINICAL DECISION MAKING: Stable/uncomplicated  EVALUATION COMPLEXITY: Low  GOALS: Goals reviewed with patient? No  SHORT TERM GOALS: Target date: 05/26/24  Pt will be independent with HEP in order to demonstrate participation in Physical Therapy POC.  Baseline: Goal status: INITIAL  2.  Pt will report <3/10 pain with mobility in order to demonstrate improved pain with ADLs.  Baseline:  Goal status: INITIAL  LONG TERM GOALS: Target date: 06/25/24  Pt will improve 30s STS test by 5 repetitions without increasing pain in order to demonstrate improved functional strength to return to desired activities.  Baseline: see objective.  Goal status: INITIAL  3.  Pt will improve Modified Oswestry score by 13% in order to demonstrate improved pain with functional goals and outcomes. Baseline: see objective.  Goal status: INITIAL  4.  Pt will report <2/10 pain with mobility including return to work activities in order to demonstrate reduced pain with ADLs lasting greater than 30 minutes.  Baseline: see objective.  Goal status: INITIAL   5.  Pt will demonstrate lifting 10# from floor without increasing pain and with good mechanics as well as push/pulling 20# x 10' on 2 repetitions in order to indicate improved tolerance for return to work tasks.  Baseline: see objective.  Goal status: INITIAL   6.  Pt will demonstrate improved SLS balance to at least 12s on each side in order to indicate improved functional balance needed for reduced fall risk and improved functional balance.  Baseline: see objective.   Goal status: INITIAL    PLAN:  PT FREQUENCY: 1-2x/week  PT DURATION: 6 weeks  PLANNED INTERVENTIONS: [id number]- PT Re-evaluation, 97110-Therapeutic exercises, 97530- Therapeutic activity, [id number]- Neuromuscular re-education, 97535- Self Care, [id number]- Manual therapy, [phone]- Gait training, [phone]- Orthotic Initial, [phone]- Electrical stimulation (unattended), Patient/Family education, Balance training, and Stair training.  PLAN FOR NEXT SESSION: Continued core engagement/lumbopelvic dissociation interventions with hip hinge emphasis; carrying/lifting and push/pull mechanics needed for RTW.  Continue X 2 more sessions then reassess.  Greig KATHEE Fuse, PTA/CLT [place]  [facility] Ph: [phone]  Vivian Greig NOVAK, PTA 06/12/2024, 8:30 AM

## 2024-06-19 ENCOUNTER — Ambulatory Visit (HOSPITAL_COMMUNITY): Payer: Self-pay | Attending: Orthopedic Surgery | Admitting: Physical Therapy

## 2024-06-19 DIAGNOSIS — S22080D Wedge compression fracture of T11-T12 vertebra, subsequent encounter for fracture with routine healing: Secondary | ICD-10-CM | POA: Insufficient documentation

## 2024-06-19 DIAGNOSIS — M5386 Other specified dorsopathies, lumbar region: Secondary | ICD-10-CM | POA: Diagnosis present

## 2024-06-19 DIAGNOSIS — M6281 Muscle weakness (generalized): Secondary | ICD-10-CM | POA: Diagnosis present

## 2024-06-19 DIAGNOSIS — X32XXXD Exposure to sunlight, subsequent encounter: Secondary | ICD-10-CM | POA: Diagnosis not present

## 2024-06-19 DIAGNOSIS — C44519 Basal cell carcinoma of skin of other part of trunk: Secondary | ICD-10-CM | POA: Diagnosis not present

## 2024-06-19 DIAGNOSIS — Z7409 Other reduced mobility: Secondary | ICD-10-CM | POA: Insufficient documentation

## 2024-06-19 DIAGNOSIS — L570 Actinic keratosis: Secondary | ICD-10-CM | POA: Diagnosis not present

## 2024-06-19 NOTE — Therapy (Signed)
 OUTPATIENT PHYSICAL THERAPY THORACOLUMBAR TREATMENT   Patient Name: Courtney Bradley MRN: [id number] DOB:25-Nov-1950, 73 y.o., female Today's Date: 06/19/2024  END OF SESSION:  PT End of Session - 06/19/24 1244     Visit Number 4    Number of Visits 6    Date for Recertification  06/25/24    Authorization Type Worker's Comp; 10 PT visits approved with direct billing to the work comp carrier    Authorization - Visit Number 4    Authorization - Number of Visits 10    Progress Note Due on Visit 7    PT Start Time [phone]    PT Stop Time 0813    PT Time Calculation (min) 40 min    Activity Tolerance Patient tolerated treatment well    Behavior During Therapy [facility] for tasks assessed/performed            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   Basal cell carcinoma (BCC) in situ of skin 2025   on chest   Elevated cholesterol 11/28/2016   GERD (gastroesophageal reflux disease)    History of kidney stones    Hormone replacement therapy (HRT) 09/19/2014   Lymphadenopathy    Thoracic, followed by [facility]   Obesity    Osteopenia    Pneumonia    PONV (postoperative nausea and vomiting)    Sarcoidosis    T12 compression fracture (HCC) 12/21/2023   Past Surgical History:  Procedure Laterality Date   BIOPSY  04/09/2021   Procedure: BIOPSY;  Surgeon: Cindie Carlin POUR, DO;  [facility];  Service: Endoscopy;;   BREAST CYST ASPIRATION     CHOLECYSTECTOMY     COLONOSCOPY N/A 11/17/2016   Procedure: COLONOSCOPY;  Surgeon: Claudis RAYMOND Rivet, MD;  Location: [facility];  Service: Endoscopy;  Laterality: N/A;  830   COLONOSCOPY WITH PROPOFOL  N/A 04/09/2021   Procedure: COLONOSCOPY WITH PROPOFOL ;  Surgeon: Cindie Carlin POUR, DO;  [facility]: [facility];  Service: Endoscopy;  Laterality: N/A;  8:30am   CORRECTION HAMMER TOE Bilateral    2nd   EYE SURGERY     GALLBLADDER SURGERY     LAPAROSCOPIC GASTRIC SLEEVE RESECTION WITH HIATAL HERNIA REPAIR  N/A 03/14/2017   Procedure: LAPAROSCOPIC GASTRIC SLEEVE RESECTION WITH HIATAL HERNIA REPAIR;  Surgeon: Tanda Locus, MD;  Location: [facility];  Service: General;  Laterality: N/A;   LITHOTRIPSY     neck lift     POLYPECTOMY  04/09/2021   Procedure: POLYPECTOMY INTESTINAL;  Surgeon: Cindie Carlin POUR, DO;  [facility];  Service: Endoscopy;;   UPPER GI ENDOSCOPY  03/14/2017   Procedure: UPPER GI ENDOSCOPY;  Surgeon: Tanda Locus, MD;  Location: [facility];  Service: General;;   Patient Active Problem List   Diagnosis Date Noted   Elevated coronary artery calcium score 10/11/2023   Family history of premature CAD 10/11/2023   Encounter for screening fecal occult blood testing 04/18/2023   Encounter for gynecological examination with Papanicolaou smear of cervix 02/14/2022   Postmenopause 02/14/2022   Diarrhea 03/15/2021   Change in bowel function 03/15/2021   Sarcoidosis 10/09/2020   Erythrocytosis 01/22/2020   History of bariatric surgery 12/27/2019   Encounter for well woman exam with routine gynecological exam 12/27/2019   Gastroesophageal reflux disease 12/22/2017   Screening for colorectal cancer 12/22/2017   Encounter for counseling 12/22/2017   Morbid obesity (HCC) 03/14/2017   Osteopenia 03/14/2017   Dyslipidemia 03/14/2017  GERD (gastroesophageal reflux disease) 03/14/2017   Elevated cholesterol 11/28/2016   Special screening for malignant neoplasms, colon 08/11/2016   Well woman exam with routine gynecological exam 07/22/2016   Weight gain 07/22/2016   Hormone replacement therapy (HRT) 09/19/2014   Fatigue fracture of vertebra of sacral and sacrococcygeal region 01/26/2011   Disease of nail 06/12/2008   Closed fracture of sacrum and coccyx (HCC) 06/18/2007    PCP: Sheryle Carwin MD  REFERRING PROVIDER: Beuford Anes MD  REFERRING DIAG: LBP  REFERRAL INFO: 10 PT visits approved with direct billing to the work comp carrier; Client previously completed about 10  weeks of PT at [facility] and she now needs strengthening and muscle re-education. Dx: T12, L1 compression fractures; HEALED PER MD based on MRI DATED 04/29/24  Rationale for Evaluation and Treatment: Rehabilitation  THERAPY DIAG:  Muscle weakness (generalized)  Decreased ROM of lumbar spine  Impaired functional mobility, balance, gait, and endurance  Compression fracture of T12 vertebra with routine healing, subsequent encounter  ONSET DATE: Last day of April 2025  SUBJECTIVE:                                                                                                                                                                                           SUBJECTIVE STATEMENT: 06/12/24:  Pt reports she has improved approx 30% since beginning therapy.  States her pain has varied and has overall improved, however increases with activity.  Currently pain is around 3/10 lower back.  Pain varies 3-8/10, never painfree.   Completed FCE yesterday and have not received the results.   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 Moving around usually feels a little better. Best she feels is when she's completely off her feet.   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 Felt a pull/pop, went back to work. 12/29/23 - Urgent care secondary to pain; 01/10/24 - Evaluated by Orthopedics, with T12 compression fracture. Initially painful during walking, limited in ADL and avoided lifting secondary to pain. 02/01/24 - 04/10/24 Initial PT sessions. 03/12/24 - Stable T12 compression fracture noted. See back in 6 weeks. 05/06/24 - Saw Neuro who prescribed muscle relaxers and restarted PT with a follow up in 30 days.   PERTINENT HISTORY:  Osteopenia  PAIN:  Are you having pain? Yes: NPRS  scale: 3 - 8 (sitting/standing long periods) - 0 (when sleeping  doesn't wake her up) Pain location: lower thoracic all the way down lumbar Pain description: aching pain; better in the morning Aggravating factors: bending Relieving factors: resting and laying on back with knees up   PRECAUTIONS: Other: hx T12 compression fracture and osteopenic  RED FLAGS: None   WEIGHT BEARING RESTRICTIONS: No  FALLS:  Has patient fallen in last 6 months? No  LIVING ENVIRONMENT: Lives with: lives with their spouse Lives in: House/apartment Stairs: No Has following equipment at home: yoga mat, weights  OCCUPATION: Radiology tech at [employer]  PLOF: Independent and Vocation/Vocational requirements: push/pull; guarding patients, w/c transfers; lifting (~50#)   PATIENT GOALS: Continue to get stronger and back to where I was  NEXT MD VISIT: First week of October  OBJECTIVE:  Note: Objective measures were completed at Evaluation unless otherwise noted.  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  Otherwise normal  PATIENT SURVEYS:  Modified Oswestry:  MODIFIED OSWESTRY DISABILITY SCALE   Date: 05/14/24 Score 06/19/24  Total 24/50=48%  20 / 50 = 40.0 %   Interpretation of scores: Score Category Description  0-20% Minimal Disability The patient can cope with most living activities. Usually no treatment is indicated apart from advice on lifting, sitting and exercise  21-40% Moderate Disability The patient experiences more pain and difficulty with sitting, lifting and standing. Travel and social life are more difficult and they may be disabled from work. Personal care, sexual activity and sleeping are not grossly affected, and the patient can usually be managed by conservative means  41-60% Severe Disability Pain remains the main problem in this group, but activities of daily living are  affected. These patients require a detailed investigation  61-80% Crippled Back pain impinges on all aspects of the patient's life. Positive intervention is required  81-100% Bed-bound  These patients are either bed-bound or exaggerating their symptoms  Bluford FORBES Zoe DELENA Karon DELENA, et al. Surgery versus conservative management of stable thoracolumbar fracture: the PRESTO feasibility RCT. [place] ([place]): [employer]; 2021 Nov. [facility] Technology Assessment, No. 25.62.) Appendix 3, Oswestry Disability Index category descriptors. Available from: Findjewelers.cz  Minimally Clinically Important Difference (MCID) = 12.8%  POSTURE: slightly sway back and intermittently corrects posture in sitting with consistent moving around likely secondary to discomfort  PALPATION: Tight paraspinals on tactile cuing noted (TL junction)  LUMBAR ROM:  (Measured within percentage of normative range of motion) ; asterick (*) indicates pain AROM eval 06/19/24  Flexion 60%  80%  Extension 50% * 75%  Right lateral flexion 60% * 75%  Left lateral flexion 60% * 75%  Right rotation 60% 75%  Left rotation 65% 75%   (Blank rows = not tested)  LLOWER EXTREMITY MMT:    MMT Right eval Left eval 06/19/24 06/19/24  Hip flexion 4+ 4+ 5 5  Hip extension   3- 3-  Hip abduction 4- 4- 4- 4-  Hip adduction      Hip internal rotation      Hip external rotation      Knee flexion 4+ 4 5 4+  Knee extension 4+ 4+ 5 5  Ankle dorsiflexion 4 4 4+ 4+  Ankle plantarflexion 4 4 4+ 4+  Ankle inversion      Ankle eversion       (Blank rows = not tested)  LUMBAR SPECIAL TESTS:  Prone instability test: Negative, Straight leg raise test: Negative, Slump test: Negative, and FABER test:  Negative  FUNCTIONAL TESTS:  Evaluation: 5 times sit to stand: 9.18s 30s STS - 15 reps without UE and slight increase in pain   SLS - (L) 6.78s (R) 5.6s  06/19/24 5 times sit to stand: 10.4 sec (was  9.18s) 30s STS - 14 X no UE (was 15 reps without UE and slight increase in pain)   SLS -   Lt:8 , Rt: 13(was L: 6.78s R: 5.6s)  GAIT: Distance walked: not tested for distance but walked throughout exam Assistive device utilized: None Level of assistance: Complete Independence Comments: decreased thoracolumbar rotation noted and decreased arm swing bilaterally too  CORE ENGAGEMENT  Decreased lumbopelvic dissociation coordination requiring max cues  Decreased core endurance/control as indicated by hip shift during SLR without core engagement noted  TREATMENT DATE:  06/19/24 Functional test measures:  5 times sit to stand: 10.4 sec (was 9.18s) 30s STS - 14 X no UE (was 15 reps without UE and slight increase in pain)   SLS -   Lt:8 , Rt: 13 (was L: 6.78s R: 5.6s) Oswestry : 40% (was 48%) MMT: see above, all improved except hip abduction ROM testing:  see above, all improved Goal review HEP review adding single leg balance, continuation of daily walking  06/12/24 POC discussion/activities/body mechanics  Supine:  Bridge 10X5 Green physioball bridge 10X5 Green physioball roll outs for hamstring and core 10X Seated:  Green physioball marching    05/29/24: Reviewed goals  Educated importance Supine:   - Supine Lower Trunk Rotation 5x 10 - Supine Double Knee to Chest  - 1 x daily - 7 x weekly - 1 sets - 6 reps - 5s hold - Supine Pelvic Floor Elevators  10x 5 - Bridge 10x 5 Seated: - Seated 3 Way Exercise [employer] Stretch 5x each directions - Swiss Ball March  -10x with ab set, therapist supporting ball  Standing:  - Hip hinge with dowel 2 x10  05/14/24 Eval and Treat Education on HEP and progression with gentle mobility into bending/lifting/twisting as tolerated and within pain manageable ranges for improving pliability and progressions to ADL and RTW tasks Core engagement with daily activity education and cuing                                                                                                                                  PATIENT EDUCATION:  Education details: return to work task specific intervention plans, core engagement interventions and maintenance with gait and routine ADLs Person educated: Patient Education method: Medical Illustrator Education comprehension: verbalized understanding and needs further education  HOME EXERCISE PROGRAM: Access Code: [email or web address] URL: [email or web address] Date: 05/14/2024 Prepared by: Lamarr Citrin  Exercises - Supine Lower Trunk Rotation  - 1 x daily - 7 x weekly - 2 sets - 10 reps - 3s hold - Supine Double Knee to Chest  - 1 x daily - 7 x weekly - 1 sets - 6  reps - 5s hold - Seated Pelvic Floor Elevators  - 4 x daily - 7 x weekly - 1 sets - 10 reps - 5s hold - Seated 3 Way Exercise Ball Roll Out Stretch  - 1 x daily - 7 x weekly - 1 sets - 10 reps - Swiss Ball March  - 1 x daily - 7 x weekly - 2 sets - 10 reps  ASSESSMENT:  CLINICAL IMPRESSION: 06/19/24:  pt to return to MD this week and unsure if will be continuing therapy after that point.  Completed test measures in case she does not return.  Pt has made improvements in ROM, LE strength and balance, however functional scores were slightly lower today.  Pt only met one short term goal and progressed toward long term goals without completion.  Pt still has remaining deficits that could be improved with continuation, however contingent upon MD/employer decision.  Encouraged pt to continue with HEP and her daily walking routine.      Eval:  Patient is a 73 y.o. female who was seen today for physical therapy evaluation and treatment for continued strengthening/ NMR post work accident subsequently resulting in T12 compression fracture on last week of April 2025. Pt continuing to be limited with with proper mechanics and painful bending, lifting, and performing ADLs around the home as noted in objective. Pt also noted with  decreased core / lumbopelvic stability and NM control/coordination, decreased BLE hip strength and balance deficits that are of concerns for increasing falls risk. Pt will benefit from skilled Physical Therapy services to address deficits/limitations in order to improve functional and QOL.   OBJECTIVE IMPAIRMENTS: Abnormal gait, decreased activity tolerance, decreased balance, decreased coordination, difficulty walking, decreased ROM, decreased strength, improper body mechanics, and pain.   ACTIVITY LIMITATIONS: carrying, lifting, bending, squatting, and stairs  PARTICIPATION LIMITATIONS: meal prep, laundry, driving, and occupation  PERSONAL FACTORS: Time since onset of injury/illness/exacerbation are also affecting patient's functional outcome.   REHAB POTENTIAL: Good  CLINICAL DECISION MAKING: Stable/uncomplicated  EVALUATION COMPLEXITY: Low  GOALS: Goals reviewed with patient? Yes  SHORT TERM GOALS: Target date: 05/26/24  Pt will be independent with HEP in order to demonstrate participation in Physical Therapy POC.  Baseline: Goal status: MET  2.  Pt will report <3/10 pain with mobility in order to demonstrate improved pain with ADLs.  Baseline:  Goal status: NOT MET  LONG TERM GOALS: Target date: 06/25/24  Pt will improve 30s STS test by 5 repetitions without increasing pain in order to demonstrate improved functional strength to return to desired activities.  Baseline: see objective.  Goal status: NOT MET  3.  Pt will improve Modified Oswestry score by 13% in order to demonstrate improved pain with functional goals and outcomes. Baseline: see objective.  Goal status: NOT MET  4.  Pt will report <2/10 pain with mobility including return to work activities in order to demonstrate reduced pain with ADLs lasting greater than 30 minutes.  Baseline: see objective.  Goal status: NOT MET   5.  Pt will demonstrate lifting 10# from floor without increasing pain and with good  mechanics as well as push/pulling 20# x 10' on 2 repetitions in order to indicate improved tolerance for return to work tasks.  Baseline: see objective.  Goal status: NOT MET  6.  Pt will demonstrate improved SLS balance to at least 12s on each side in order to indicate improved functional balance needed for reduced fall risk and improved functional balance.  Baseline:  see objective.  Goal status: NOT MET   PLAN:  PT FREQUENCY: 1-2x/week  PT DURATION: 6 weeks  PLANNED INTERVENTIONS: 97164- PT Re-evaluation, 97110-Therapeutic exercises, 97530- Therapeutic activity, [id number]- Neuromuscular re-education, 97535- Self Care, [id number]- Manual therapy, [phone]- Gait training, [phone]- Orthotic Initial, [phone]- Electrical stimulation (unattended), Patient/Family education, Balance training, and Stair training.  PLAN FOR NEXT SESSION: Pt to discuss continuation of PT with MD upon return with possible discharge to HEP at this time.   Greig KATHEE Fuse, PTA/CLT [facility] Ph: [phone]  Fuse Greig KATHEE, PTA 06/19/2024, 1:19 PM

## 2024-06-26 ENCOUNTER — Encounter (HOSPITAL_COMMUNITY)

## 2024-07-22 DIAGNOSIS — S22080A Wedge compression fracture of T11-T12 vertebra, initial encounter for closed fracture: Secondary | ICD-10-CM | POA: Diagnosis not present

## 2024-07-22 DIAGNOSIS — D860 Sarcoidosis of lung: Secondary | ICD-10-CM | POA: Diagnosis not present

## 2024-07-22 DIAGNOSIS — E785 Hyperlipidemia, unspecified: Secondary | ICD-10-CM | POA: Diagnosis not present

## 2024-07-23 ENCOUNTER — Encounter (HOSPITAL_COMMUNITY): Payer: Self-pay | Admitting: Internal Medicine

## 2024-07-25 ENCOUNTER — Other Ambulatory Visit (HOSPITAL_COMMUNITY): Payer: Self-pay | Admitting: Internal Medicine

## 2024-07-25 DIAGNOSIS — S22080A Wedge compression fracture of T11-T12 vertebra, initial encounter for closed fracture: Secondary | ICD-10-CM

## 2024-07-31 DIAGNOSIS — Z85828 Personal history of other malignant neoplasm of skin: Secondary | ICD-10-CM | POA: Diagnosis not present

## 2024-07-31 DIAGNOSIS — Z08 Encounter for follow-up examination after completed treatment for malignant neoplasm: Secondary | ICD-10-CM | POA: Diagnosis not present

## 2024-09-18 ENCOUNTER — Ambulatory Visit: Admitting: Gastroenterology

## 2024-10-02 ENCOUNTER — Ambulatory Visit: Admitting: Gastroenterology

## 2024-10-14 ENCOUNTER — Other Ambulatory Visit (HOSPITAL_COMMUNITY)
